# Patient Record
Sex: Female | Born: 1985 | Race: Black or African American | Hispanic: No | Marital: Single | Smoking: Former smoker
Health system: Southern US, Community
[De-identification: ages and names within clinical notes are randomized; demographics above are authoritative.]

## PROBLEM LIST (undated history)

## (undated) DIAGNOSIS — Z8489 Family history of other specified conditions: Secondary | ICD-10-CM

## (undated) DIAGNOSIS — F419 Anxiety disorder, unspecified: Secondary | ICD-10-CM

## (undated) DIAGNOSIS — Z8616 Personal history of COVID-19: Secondary | ICD-10-CM

## (undated) DIAGNOSIS — T8859XA Other complications of anesthesia, initial encounter: Secondary | ICD-10-CM

## (undated) DIAGNOSIS — G40909 Epilepsy, unspecified, not intractable, without status epilepticus: Secondary | ICD-10-CM

## (undated) DIAGNOSIS — F32A Depression, unspecified: Secondary | ICD-10-CM

## (undated) DIAGNOSIS — N83202 Unspecified ovarian cyst, left side: Secondary | ICD-10-CM

## (undated) DIAGNOSIS — K219 Gastro-esophageal reflux disease without esophagitis: Secondary | ICD-10-CM

## (undated) DIAGNOSIS — R519 Headache, unspecified: Secondary | ICD-10-CM

## (undated) DIAGNOSIS — R51 Headache: Secondary | ICD-10-CM

## (undated) DIAGNOSIS — R569 Unspecified convulsions: Secondary | ICD-10-CM

## (undated) HISTORY — DX: Headache, unspecified: R51.9

## (undated) HISTORY — DX: Headache: R51

## (undated) HISTORY — PX: APPENDECTOMY: SHX54

## (undated) HISTORY — PX: OTHER SURGICAL HISTORY: SHX169

---
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
## 2005-12-22 HISTORY — PX: LAPAROSCOPIC OOPHORECTOMY: SUR783

## 2014-12-27 ENCOUNTER — Emergency Department (HOSPITAL_COMMUNITY): Payer: Self-pay

## 2014-12-27 ENCOUNTER — Emergency Department (HOSPITAL_COMMUNITY)
Admission: EM | Admit: 2014-12-27 | Discharge: 2014-12-27 | Disposition: A | Discharge status: Home / Self Care | Payer: Self-pay | Attending: Emergency Medicine | Admitting: Emergency Medicine

## 2014-12-27 ENCOUNTER — Encounter (HOSPITAL_COMMUNITY): Payer: Self-pay | Admitting: *Deleted

## 2014-12-27 DIAGNOSIS — Z720 Tobacco use: Secondary | ICD-10-CM | POA: Insufficient documentation

## 2014-12-27 DIAGNOSIS — R0781 Pleurodynia: Secondary | ICD-10-CM | POA: Insufficient documentation

## 2014-12-27 DIAGNOSIS — R6883 Chills (without fever): Secondary | ICD-10-CM | POA: Insufficient documentation

## 2014-12-27 DIAGNOSIS — J069 Acute upper respiratory infection, unspecified: Secondary | ICD-10-CM | POA: Insufficient documentation

## 2014-12-27 DIAGNOSIS — R000 Tachycardia, unspecified: Secondary | ICD-10-CM | POA: Insufficient documentation

## 2014-12-27 HISTORY — DX: Unspecified convulsions: R56.9

## 2014-12-27 LAB — BASIC METABOLIC PANEL
ANION GAP: 12 (ref 5–15)
BUN: 6 mg/dL (ref 6–23)
CALCIUM: 9.1 mg/dL (ref 8.4–10.5)
CO2: 21 mmol/L (ref 19–32)
CREATININE: 0.67 mg/dL (ref 0.50–1.10)
Chloride: 104 mEq/L (ref 96–112)
GFR calc Af Amer: >90 mL/min (ref >90)
GFR calc non Af Amer: >90 mL/min (ref >90)
Glucose, Bld: 86 mg/dL (ref 70–99)
POTASSIUM: 3.7 mmol/L (ref 3.5–5.1)
Sodium: 137 mmol/L (ref 135–145)

## 2014-12-27 LAB — CBC WITH DIFFERENTIAL/PLATELET
Basophils Absolute: 0 10*3/uL (ref 0.0–0.1)
Basophils Relative: 0 % (ref 0–1)
EOS PCT: 1 % (ref 0–5)
Eosinophils Absolute: 0.1 10*3/uL (ref 0.0–0.7)
HCT: 38.8 % (ref 36.0–46.0)
HEMOGLOBIN: 13.4 g/dL (ref 12.0–15.0)
LYMPHS ABS: 1.6 10*3/uL (ref 0.7–4.0)
LYMPHS PCT: 15 % (ref 12–46)
MCH: 32.4 pg (ref 26.0–34.0)
MCHC: 34.5 g/dL (ref 30.0–36.0)
MCV: 93.7 fL (ref 78.0–100.0)
Monocytes Absolute: 1.5 10*3/uL — ABNORMAL HIGH (ref 0.1–1.0)
Monocytes Relative: 14 % — ABNORMAL HIGH (ref 3–12)
NEUTROS ABS: 7.7 10*3/uL (ref 1.7–7.7)
Neutrophils Relative %: 70 % (ref 43–77)
Platelets: 364 10*3/uL (ref 150–400)
RBC: 4.14 MIL/uL (ref 3.87–5.11)
RDW: 12.9 % (ref 11.5–15.5)
WBC: 10.9 10*3/uL — AB (ref 4.0–10.5)

## 2014-12-27 LAB — TROPONIN I: Troponin I: <0.03 ng/mL (ref <0.031)

## 2014-12-27 LAB — D-DIMER, QUANTITATIVE (NOT AT ARMC)

## 2014-12-27 MED ORDER — SODIUM CHLORIDE 0.9 % IV BOLUS (SEPSIS)
1000.0000 mL | Freq: Once | INTRAVENOUS | Status: AC
  Administered 2014-12-27: 1000 mL via INTRAVENOUS

## 2014-12-27 NOTE — ED Provider Notes (Signed)
CSN: [id number]     Arrival date & time 12/27/14  1246 History   First MD Initiated Contact with Patient 12/27/14 1449     Chief Complaint  Patient presents with  . Cough     (Consider location/radiation/quality/duration/timing/severity/associated sxs/prior Treatment) Patient is a 29 y.o. female presenting with cough.  Cough Cough characteristics:  Productive Sputum characteristics:  Yellow Severity:  Moderate Onset quality:  Gradual Duration:  2 days Timing:  Constant Progression:  Worsening Chronicity:  New Smoker: yes   Context: upper respiratory infection   Relieved by:  Nothing Worsened by:  Nothing tried Ineffective treatments:  None tried Associated symptoms: chest pain (pleuritic), chills, rhinorrhea, shortness of breath and sinus congestion   Associated symptoms: no fever     Past Medical History  Diagnosis Date  . Seizures    History reviewed. No pertinent past surgical history. History reviewed. No pertinent family history. History  Substance Use Topics  . Smoking status: Current Every Day Smoker  . Smokeless tobacco: Not on file  . Alcohol Use: Not on file   OB History    No data available     Review of Systems  Constitutional: Positive for chills. Negative for fever.  HENT: Positive for rhinorrhea.   Respiratory: Positive for cough and shortness of breath.   Cardiovascular: Positive for chest pain (pleuritic).  All other systems reviewed and are negative.     Allergies  Depakote er  Home Medications   Prior to Admission medications   Medication Sig Start Date End Date Taking? Authorizing Provider  guaifenesin (ROBITUSSIN) 100 MG/5ML syrup Take 200 mg by mouth once.   Yes Historical Provider, MD  Pseudoeph-Doxylamine-DM-APAP (NYQUIL PO) Take 1 Dose by mouth once.   Yes Historical Provider, MD   BP 97/51 mmHg  Pulse 84  Temp(Src) 99.6 F (37.6 C) (Oral)  Resp 20  Ht 5\' 7"  (1.702 m)  Wt 170 lb (77.111 kg)  BMI 26.62 kg/m2  SpO2 97%   LMP 12/27/2014 Physical Exam  Constitutional: She is oriented to person, place, and time. She appears well-developed and well-nourished.  HENT:  Head: Normocephalic and atraumatic.  Right Ear: External ear normal.  Left Ear: External ear normal.  Eyes: Conjunctivae and EOM are normal. Pupils are equal, round, and reactive to light.  Neck: Normal range of motion. Neck supple.  Cardiovascular: Regular rhythm, normal heart sounds and intact distal pulses.  Tachycardia present.  Exam reveals no decreased pulses.   Pulmonary/Chest: Effort normal and breath sounds normal.  Abdominal: Soft. Bowel sounds are normal. There is no tenderness.  Musculoskeletal: Normal range of motion.  Neurological: She is alert and oriented to person, place, and time.  Skin: Skin is warm and dry.  Vitals reviewed.   ED Course  Procedures (including critical care time) Labs Review Labs Reviewed  CBC WITH DIFFERENTIAL - Abnormal; Notable for the following:    WBC 10.9 (*)    Monocytes Relative 14 (*)    Monocytes Absolute 1.5 (*)    All other components within normal limits  BASIC METABOLIC PANEL  TROPONIN I  D-DIMER, QUANTITATIVE    Imaging Review Dg Chest 2 View  12/27/2014   CLINICAL DATA:  Cough and chest pain. Shortness of breath since yesterday.  EXAM: CHEST  2 VIEW  COMPARISON:  None.  FINDINGS: Midline trachea. Normal heart size and mediastinal contours. No pleural effusion or pneumothorax. Clear lungs.  IMPRESSION: No acute cardiopulmonary disease.   Electronically Signed   By: Abigail Miyamoto  M.D.   On: 12/27/2014 15:16     EKG Interpretation   Date/Time:  Wednesday December 27 2014 15:34:59 EST Ventricular Rate:  103 PR Interval:  129 QRS Duration: 74 QT Interval:  324 QTC Calculation: 424 R Axis:   85 Text Interpretation:  Sinus tachycardia No old tracing to compare  Confirmed by Debby Freiberg [phone]) on 12/27/2014 4:00:45 PM      MDM   Final diagnoses:  Upper respiratory infection     29 y.o. female without pertinent PMH presents with chest pain, pleurisy, and cough x 2 days.  On arrival vitals and physical exam as above.  Low risk via Wells, cannot PERC 2/2 tachycardia.  Very low risk for ACS.  CXR unremarkable.  Ddimer and wu unremarkable.  Pt has had persistent chest pain over the course of the entire day, so if the pt was having ACS would expect positive troponin by this point.  Likely etiology of symptoms viral upper respiratory infection. Patient discharged home in stable condition with strict return precautions..    I have reviewed all laboratory and imaging studies if ordered as above  1. Upper respiratory infection         Debby Freiberg, MD 12/27/14 [phone]

## 2014-12-27 NOTE — ED Notes (Signed)
Patient transported to X-ray 

## 2014-12-27 NOTE — Discharge Instructions (Signed)
Cough, Adult ° A cough is a reflex that helps clear your throat and airways. It can help heal the body or may be a reaction to an irritated airway. A cough may only last 2 or 3 weeks (acute) or may last more than 8 weeks (chronic).  °CAUSES °Acute cough: °· Viral or bacterial infections. °Chronic cough: °· Infections. °· Allergies. °· Asthma. °· Post-nasal drip. °· Smoking. °· Heartburn or acid reflux. °· Some medicines. °· Chronic lung problems (COPD). °· Cancer. °SYMPTOMS  °· Cough. °· Fever. °· Chest pain. °· Increased breathing rate. °· High-pitched whistling sound when breathing (wheezing). °· Colored mucus that you cough up (sputum). °TREATMENT  °· A bacterial cough may be treated with antibiotic medicine. °· A viral cough must run its course and will not respond to antibiotics. °· Your caregiver may recommend other treatments if you have a chronic cough. °HOME CARE INSTRUCTIONS  °· Only take over-the-counter or prescription medicines for pain, discomfort, or fever as directed by your caregiver. Use cough suppressants only as directed by your caregiver. °· Use a cold steam vaporizer or humidifier in your bedroom or home to help loosen secretions. °· Sleep in a semi-upright position if your cough is worse at night. °· Rest as needed. °· Stop smoking if you smoke. °SEEK IMMEDIATE MEDICAL CARE IF:  °· You have pus in your sputum. °· Your cough starts to worsen. °· You cannot control your cough with suppressants and are losing sleep. °· You begin coughing up blood. °· You have difficulty breathing. °· You develop pain which is getting worse or is uncontrolled with medicine. °· You have a fever. °MAKE SURE YOU:  °· Understand these instructions. °· Will watch your condition. °· Will get help right away if you are not doing well or get worse. °Document Released: 06/06/2011 Document Revised: 03/01/2012 Document Reviewed: 06/06/2011 °ExitCare® Patient Information ©2015 [employer]. This information is not intended  to replace advice given to you by your health care provider. Make sure you discuss any questions you have with your health care provider. °Upper Respiratory Infection, Adult °An upper respiratory infection (URI) is also sometimes known as the common cold. The upper respiratory tract includes the nose, sinuses, throat, trachea, and bronchi. Bronchi are the airways leading to the lungs. Most people improve within 1 week, but symptoms can last up to 2 weeks. A residual cough may last even longer.  °CAUSES °Many different viruses can infect the tissues lining the upper respiratory tract. The tissues become irritated and inflamed and often become very moist. Mucus production is also common. A cold is contagious. You can easily spread the virus to others by oral contact. This includes kissing, sharing a glass, coughing, or sneezing. Touching your mouth or nose and then touching a surface, which is then touched by another person, can also spread the virus. °SYMPTOMS  °Symptoms typically develop 1 to 3 days after you come in contact with a cold virus. Symptoms vary from person to person. They may include: °· Runny nose. °· Sneezing. °· Nasal congestion. °· Sinus irritation. °· Sore throat. °· Loss of voice (laryngitis). °· Cough. °· Fatigue. °· Muscle aches. °· Loss of appetite. °· Headache. °· Low-grade fever. °DIAGNOSIS  °You might diagnose your own cold based on familiar symptoms, since most people get a cold 2 to 3 times a year. Your caregiver can confirm this based on your exam. Most importantly, your caregiver can check that your symptoms are not due to another disease such   as strep throat, sinusitis, pneumonia, asthma, or epiglottitis. Blood tests, throat tests, and X-rays are not necessary to diagnose a common cold, but they may sometimes be helpful in excluding other more serious diseases. Your caregiver will decide if any further tests are required. °RISKS AND COMPLICATIONS  °You may be at risk for a more severe  case of the common cold if you smoke cigarettes, have chronic heart disease (such as heart failure) or lung disease (such as asthma), or if you have a weakened immune system. The very young and very old are also at risk for more serious infections. Bacterial sinusitis, middle ear infections, and bacterial pneumonia can complicate the common cold. The common cold can worsen asthma and chronic obstructive pulmonary disease (COPD). Sometimes, these complications can require emergency medical care and may be life-threatening. °PREVENTION  °The best way to protect against getting a cold is to practice good hygiene. Avoid oral or hand contact with people with cold symptoms. Wash your hands often if contact occurs. There is no clear evidence that vitamin C, vitamin E, echinacea, or exercise reduces the chance of developing a cold. However, it is always recommended to get plenty of rest and practice good nutrition. °TREATMENT  °Treatment is directed at relieving symptoms. There is no cure. Antibiotics are not effective, because the infection is caused by a virus, not by bacteria. Treatment may include: °· Increased fluid intake. Sports drinks offer valuable electrolytes, sugars, and fluids. °· Breathing heated mist or steam (vaporizer or shower). °· Eating chicken soup or other clear broths, and maintaining good nutrition. °· Getting plenty of rest. °· Using gargles or lozenges for comfort. °· Controlling fevers with ibuprofen or acetaminophen as directed by your caregiver. °· Increasing usage of your inhaler if you have asthma. °Zinc gel and zinc lozenges, taken in the first 24 hours of the common cold, can shorten the duration and lessen the severity of symptoms. Pain medicines may help with fever, muscle aches, and throat pain. A variety of non-prescription medicines are available to treat congestion and runny nose. Your caregiver can make recommendations and may suggest nasal or lung inhalers for other symptoms.  °HOME  CARE INSTRUCTIONS  °· Only take over-the-counter or prescription medicines for pain, discomfort, or fever as directed by your caregiver. °· Use a warm mist humidifier or inhale steam from a shower to increase air moisture. This may keep secretions moist and make it easier to breathe. °· Drink enough water and fluids to keep your urine clear or pale yellow. °· Rest as needed. °· Return to work when your temperature has returned to normal or as your caregiver advises. You may need to stay home longer to avoid infecting others. You can also use a face mask and careful hand washing to prevent spread of the virus. °SEEK MEDICAL CARE IF:  °· After the first few days, you feel you are getting worse rather than better. °· You need your caregiver's advice about medicines to control symptoms. °· You develop chills, worsening shortness of breath, or brown or red sputum. These may be signs of pneumonia. °· You develop yellow or brown nasal discharge or pain in the face, especially when you bend forward. These may be signs of sinusitis. °· You develop a fever, swollen neck glands, pain with swallowing, or white areas in the back of your throat. These may be signs of strep throat. °SEEK IMMEDIATE MEDICAL CARE IF:  °· You have a fever. °· You develop severe or persistent headache, ear   pain, sinus pain, or chest pain. °· You develop wheezing, a prolonged cough, cough up blood, or have a change in your usual mucus (if you have chronic lung disease). °· You develop sore muscles or a stiff neck. °Document Released: 06/03/2001 Document Revised: 03/01/2012 Document Reviewed: 03/15/2014 °ExitCare® Patient Information ©2015 [employer]. This information is not intended to replace advice given to you by your health care provider. Make sure you discuss any questions you have with your health care provider. ° °

## 2014-12-27 NOTE — ED Notes (Signed)
Pt in c/o cough and congestion for the last few days, pain when taking a deep breath or coughing, body aches and chills

## 2015-04-07 ENCOUNTER — Emergency Department (HOSPITAL_COMMUNITY)
Admission: EM | Admit: 2015-04-07 | Discharge: 2015-04-07 | Disposition: A | Discharge status: Home / Self Care | Payer: Self-pay | Attending: Emergency Medicine | Admitting: Emergency Medicine

## 2015-04-07 ENCOUNTER — Encounter (HOSPITAL_COMMUNITY): Payer: Self-pay | Admitting: Emergency Medicine

## 2015-04-07 DIAGNOSIS — Z720 Tobacco use: Secondary | ICD-10-CM | POA: Insufficient documentation

## 2015-04-07 DIAGNOSIS — IMO0002 Reserved for concepts with insufficient information to code with codable children: Secondary | ICD-10-CM

## 2015-04-07 DIAGNOSIS — Y9389 Activity, other specified: Secondary | ICD-10-CM | POA: Insufficient documentation

## 2015-04-07 DIAGNOSIS — Y9289 Other specified places as the place of occurrence of the external cause: Secondary | ICD-10-CM | POA: Insufficient documentation

## 2015-04-07 DIAGNOSIS — S61211A Laceration without foreign body of left index finger without damage to nail, initial encounter: Secondary | ICD-10-CM | POA: Insufficient documentation

## 2015-04-07 DIAGNOSIS — Y998 Other external cause status: Secondary | ICD-10-CM | POA: Insufficient documentation

## 2015-04-07 DIAGNOSIS — Z79899 Other long term (current) drug therapy: Secondary | ICD-10-CM | POA: Insufficient documentation

## 2015-04-07 DIAGNOSIS — Y288XXA Contact with other sharp object, undetermined intent, initial encounter: Secondary | ICD-10-CM | POA: Insufficient documentation

## 2015-04-07 NOTE — ED Notes (Signed)
Pt reports cutting L index finger on razor this evening. Bleeding controlled at this time.

## 2015-04-07 NOTE — Discharge Instructions (Signed)
2. Cover with a Band-Aid for the next several days

## 2015-04-07 NOTE — ED Provider Notes (Signed)
CSN: [id number]     Arrival date & time 04/07/15  0011 History   First MD Initiated Contact with Patient 04/07/15 0021     Chief Complaint  Patient presents with  . finger laceration      (Consider location/radiation/quality/duration/timing/severity/associated sxs/prior Treatment) HPI Comments: Patient states she was reaching for something in a cab and inadvertently cut her left index finger on a razor. She has a small superficial avulsion laceration to the medial aspect of the distal left index finger  The history is provided by the patient.    Past Medical History  Diagnosis Date  . Seizures    Past Surgical History  Procedure Laterality Date  . Appendectomy     No family history on file. History  Substance Use Topics  . Smoking status: Current Every Day Smoker -- 0.50 packs/day    Types: Cigarettes  . Smokeless tobacco: Not on file  . Alcohol Use: No   OB History    No data available     Review of Systems  Constitutional: Negative for activity change.  Skin: Positive for wound.  Neurological: Negative for numbness.  All other systems reviewed and are negative.     Allergies  Depakote er  Home Medications   Prior to Admission medications   Medication Sig Start Date End Date Taking? Authorizing Provider  guaifenesin (ROBITUSSIN) 100 MG/5ML syrup Take 200 mg by mouth once.    Historical Provider, MD  Pseudoeph-Doxylamine-DM-APAP (NYQUIL PO) Take 1 Dose by mouth once.    Historical Provider, MD   BP 130/78 mmHg  Pulse 114  Temp(Src) 98.2 F (36.8 C) (Oral)  Resp 20  SpO2 100%  LMP 03/18/2015 Physical Exam  Constitutional: She is oriented to person, place, and time. She appears well-developed and well-nourished.  HENT:  Head: Normocephalic.  Eyes: Pupils are equal, round, and reactive to light.  Neck: Normal range of motion.  Cardiovascular: Normal rate and regular rhythm.   Musculoskeletal: Normal range of motion. She exhibits no tenderness.   Neurological: She is alert and oriented to person, place, and time.  Skin: Skin is warm.  Nursing note and vitals reviewed.   ED Course  LACERATION REPAIR Date/Time: 04/07/2015 1:19 AM Performed by: Junius Creamer Authorized by: Junius Creamer Consent: Verbal consent obtained. Written consent not obtained. Risks and benefits: risks, benefits and alternatives were discussed Consent given by: patient Patient understanding: patient states understanding of the procedure being performed Patient identity confirmed: verbally with patient Body area: upper extremity Location details: left index finger Laceration length: 0.5 cm Foreign bodies: metal Tendon involvement: none Nerve involvement: none Vascular damage: no Anesthetic total: 0 ml Patient sedated: no Preparation: Patient was prepped and draped in the usual sterile fashion. Irrigation solution: saline Skin closure: glue Approximation difficulty: simple Patient tolerance: Patient tolerated the procedure well with no immediate complications   (including critical care time) Labs Review Labs Reviewed - No data to display  Imaging Review No results found.   EKG Interpretation None      MDM   Final diagnoses:  Laceration         Junius Creamer, NP 04/07/15 0121  Debby Freiberg, MD 04/07/15 [phone]

## 2016-02-11 ENCOUNTER — Emergency Department (HOSPITAL_COMMUNITY): Payer: BLUE CROSS/BLUE SHIELD

## 2016-02-11 ENCOUNTER — Emergency Department (HOSPITAL_COMMUNITY)
Admission: EM | Admit: 2016-02-11 | Discharge: 2016-02-11 | Disposition: A | Discharge status: Home / Self Care | Payer: BLUE CROSS/BLUE SHIELD | Attending: Emergency Medicine | Admitting: Emergency Medicine

## 2016-02-11 ENCOUNTER — Encounter (HOSPITAL_COMMUNITY): Payer: Self-pay

## 2016-02-11 DIAGNOSIS — R51 Headache: Secondary | ICD-10-CM | POA: Insufficient documentation

## 2016-02-11 DIAGNOSIS — F17210 Nicotine dependence, cigarettes, uncomplicated: Secondary | ICD-10-CM | POA: Insufficient documentation

## 2016-02-11 DIAGNOSIS — R55 Syncope and collapse: Secondary | ICD-10-CM

## 2016-02-11 DIAGNOSIS — F1210 Cannabis abuse, uncomplicated: Secondary | ICD-10-CM | POA: Diagnosis not present

## 2016-02-11 DIAGNOSIS — Z3202 Encounter for pregnancy test, result negative: Secondary | ICD-10-CM | POA: Insufficient documentation

## 2016-02-11 DIAGNOSIS — R569 Unspecified convulsions: Secondary | ICD-10-CM | POA: Diagnosis present

## 2016-02-11 DIAGNOSIS — F1910 Other psychoactive substance abuse, uncomplicated: Secondary | ICD-10-CM | POA: Insufficient documentation

## 2016-02-11 LAB — CBC WITH DIFFERENTIAL/PLATELET
BASOS ABS: 0 10*3/uL (ref 0.0–0.1)
Basophils Relative: 0 %
Eosinophils Absolute: 0.1 10*3/uL (ref 0.0–0.7)
Eosinophils Relative: 1 %
HEMATOCRIT: 38.3 % (ref 36.0–46.0)
Hemoglobin: 12.3 g/dL (ref 12.0–15.0)
LYMPHS ABS: 2.7 10*3/uL (ref 0.7–4.0)
Lymphocytes Relative: 27 %
MCH: 31.1 pg (ref 26.0–34.0)
MCHC: 32.1 g/dL (ref 30.0–36.0)
MCV: 96.7 fL (ref 78.0–100.0)
MONOS PCT: 5 %
Monocytes Absolute: 0.5 10*3/uL (ref 0.1–1.0)
NEUTROS ABS: 6.6 10*3/uL (ref 1.7–7.7)
NEUTROS PCT: 67 %
Platelets: 370 10*3/uL (ref 150–400)
RBC: 3.96 MIL/uL (ref 3.87–5.11)
RDW: 13.4 % (ref 11.5–15.5)
WBC: 9.9 10*3/uL (ref 4.0–10.5)

## 2016-02-11 LAB — POC URINE PREG, ED: Preg Test, Ur: NEGATIVE

## 2016-02-11 LAB — COMPREHENSIVE METABOLIC PANEL
ALK PHOS: 55 U/L (ref 38–126)
ALT: 10 U/L — AB (ref 14–54)
AST: 12 U/L — AB (ref 15–41)
Albumin: 3.9 g/dL (ref 3.5–5.0)
Anion gap: 10 (ref 5–15)
BILIRUBIN TOTAL: 0.2 mg/dL — AB (ref 0.3–1.2)
BUN: 13 mg/dL (ref 6–20)
CALCIUM: 8.8 mg/dL — AB (ref 8.9–10.3)
CO2: 21 mmol/L — ABNORMAL LOW (ref 22–32)
CREATININE: 0.7 mg/dL (ref 0.44–1.00)
Chloride: 108 mmol/L (ref 101–111)
GFR calc Af Amer: >60 mL/min (ref >60)
Glucose, Bld: 100 mg/dL — ABNORMAL HIGH (ref 65–99)
Potassium: 3.8 mmol/L (ref 3.5–5.1)
Sodium: 139 mmol/L (ref 135–145)
TOTAL PROTEIN: 6.4 g/dL — AB (ref 6.5–8.1)

## 2016-02-11 LAB — RAPID URINE DRUG SCREEN, HOSP PERFORMED
Amphetamines: NOT DETECTED
Barbiturates: NOT DETECTED
Benzodiazepines: POSITIVE — AB
Cocaine: NOT DETECTED
OPIATES: NOT DETECTED
Tetrahydrocannabinol: POSITIVE — AB

## 2016-02-11 NOTE — ED Notes (Signed)
Upon speaking with patient, she revealed that she has been having seizures since she was 30 y/o and her last seizure was 4 years ago. Pt confirmed that she is not on any seizure medications. 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

## 2016-02-11 NOTE — ED Notes (Addendum)
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 When FD and EMS arrived she was awake but post-ictal such as confusion with no memory of what happened. She has a hx of seizures but last one was at 30 years old and she is not on any seizure medications. Pt is now A&Ox4 but reports HA and sleepiness as well as pain to inside of right cheek, no blood noted. No seizure activity witnessed by anyone. CBG-106 BP-109/75, HR-86, 99% RA.

## 2016-02-11 NOTE — ED Notes (Signed)
Pt states 'I dont feel like I had a seizure."

## 2016-02-11 NOTE — ED Notes (Signed)
Dr Pickering at bedside 

## 2016-02-11 NOTE — Discharge Instructions (Signed)

## 2016-02-11 NOTE — ED Provider Notes (Signed)
CSN: [id number]     Arrival date & time 02/11/16  1540 History   First MD Initiated Contact with Patient 02/11/16 1554     Chief Complaint  Patient presents with  . Seizures    Patient is a 30 y.o. female presenting with seizures. The history is provided by the patient.  Seizures Seizure activity on arrival: no    patient presents after syncopal episode. Found confused and less responsive on the floor. History of seizures but last one was around 4 years ago. States she's had a headache for the last week. It is dull and on the right side. Is constant. No localizing numbness or weakness. Reportedly was found on the floor and was confused. Mental status but improving. Slight pain to her right cheek area. Patient states she does not feels if she has had a seizure. No chest pain. She been doing well last few days otherwise. Denies dysuria. Denies chest pain. Denies cough.  Past Medical History  Diagnosis Date  . Seizures [facility])    Past Surgical History  Procedure Laterality Date  . Appendectomy     No family history on file. Social History  Substance Use Topics  . Smoking status: Current Every Day Smoker -- 0.50 packs/day    Types: Cigarettes  . Smokeless tobacco: None  . Alcohol Use: No   OB History    No data available     Review of Systems  Constitutional: Negative for activity change and appetite change.  Eyes: Negative for pain.  Respiratory: Negative for chest tightness and shortness of breath.   Cardiovascular: Negative for chest pain and leg swelling.  Gastrointestinal: Negative for nausea, vomiting, abdominal pain and diarrhea.  Genitourinary: Negative for flank pain.  Musculoskeletal: Negative for back pain and neck stiffness.  Skin: Negative for rash.  Neurological: Positive for seizures and syncope. Negative for weakness, numbness and headaches.  Psychiatric/Behavioral: Negative for behavioral problems.      Allergies  Depakote er  Home Medications   Prior to  Admission medications   Medication Sig Start Date End Date Taking? Authorizing Provider  Vitamin D, Ergocalciferol, (DRISDOL) 50000 units CAPS capsule Take 50,000 Units by mouth 2 (two) times a week. mon and thurs   Yes Historical Provider, MD   BP 102/72 mmHg  Pulse 78  Temp(Src) 98.6 F (37 C) (Oral)  Resp 11  Ht 5\' 7"  (1.702 m)  Wt 168 lb (76.204 kg)  BMI 26.31 kg/m2  SpO2 100% Physical Exam  Constitutional: She is oriented to person, place, and time. She appears well-developed and well-nourished.  HENT:  Head: Normocephalic and atraumatic.  No visible trauma in her mouth.  Eyes: EOM are normal. Pupils are equal, round, and reactive to light.  Neck: Normal range of motion. Neck supple.  Cardiovascular: Normal rate, regular rhythm and normal heart sounds.   No murmur heard. Pulmonary/Chest: Effort normal and breath sounds normal. No respiratory distress. She has no wheezes. She has no rales.  Abdominal: Soft. Bowel sounds are normal. She exhibits no distension. There is no tenderness.  Musculoskeletal: Normal range of motion.  Neurological: She is alert and oriented to person, place, and time. No cranial nerve deficit.  Skin: Skin is warm and dry.  Psychiatric: She has a normal mood and affect. Her speech is normal.  Nursing note and vitals reviewed.   ED Course  Procedures (including critical care time) Labs Review Labs Reviewed  COMPREHENSIVE METABOLIC PANEL - Abnormal; Notable for the following:    CO2  21 (*)    Glucose, Bld 100 (*)    Calcium 8.8 (*)    Total Protein 6.4 (*)    AST 12 (*)    ALT 10 (*)    Total Bilirubin 0.2 (*)    All other components within normal limits  URINE RAPID DRUG SCREEN, HOSP PERFORMED - Abnormal; Notable for the following:    Benzodiazepines POSITIVE (*)    Tetrahydrocannabinol POSITIVE (*)    All other components within normal limits  CBC WITH DIFFERENTIAL/PLATELET  POC URINE PREG, ED    Imaging Review Ct Head Wo  Contrast  02/11/2016  CLINICAL DATA:  Headache for last 2 weeks. Dizziness with standing. History of seizures. EXAM: CT HEAD WITHOUT CONTRAST TECHNIQUE: Contiguous axial images were obtained from the base of the skull through the vertex without intravenous contrast. COMPARISON:  None. FINDINGS: No acute intracranial abnormality. Specifically, no hemorrhage, hydrocephalus, mass lesion, acute infarction, or significant intracranial injury. No acute calvarial abnormality. Visualized paranasal sinuses and mastoids clear. Orbital soft tissues unremarkable. IMPRESSION: Negative. Electronically Signed   By: Rolm Baptise M.D.   On: 02/11/2016 16:47   I have personally reviewed and evaluated these images and lab results as part of my medical decision-making.   EKG Interpretation   Date/Time:  Monday February 11 2016 16:53:17 EST Ventricular Rate:  75 PR Interval:  118 QRS Duration: 81 QT Interval:  377 QTC Calculation: 421 R Axis:   94 Text Interpretation:  Sinus rhythm Borderline short PR interval Borderline  right axis deviation Confirmed by Alvino Chapel  MD, Ovid Curd [phone]) on  02/11/2016 9:03:54 PM      MDM   Final diagnoses:  Syncope, unspecified syncope type    Patient with syncope. Initial concern for seizure but no witnessed seizure activity on camera that was recording the daycare center. Reportedly had been grabbing her shirt like she was hot. Had some confusion after. Head CT reassuring. Lab work reassuring. Urine drug screen did show benzos and marijuana. Patient denies drug use. Denies any benzos. I did not see that EMS gave her any medicine. This point I'm not sure this is a seizure. There is no trauma to her tongue she was not incontinent although it is a possibility. This point I will not put her on seizure precautions. Will discharge home to follow-up with her doctors as needed.    Davonna Belling, MD 02/11/16 2105

## 2016-02-29 ENCOUNTER — Emergency Department (HOSPITAL_COMMUNITY)
Admission: EM | Admit: 2016-02-29 | Discharge: 2016-02-29 | Disposition: A | Discharge status: Home / Self Care | Payer: BLUE CROSS/BLUE SHIELD | Attending: Emergency Medicine | Admitting: Emergency Medicine

## 2016-02-29 ENCOUNTER — Encounter (HOSPITAL_COMMUNITY): Payer: Self-pay | Admitting: Emergency Medicine

## 2016-02-29 DIAGNOSIS — R42 Dizziness and giddiness: Secondary | ICD-10-CM

## 2016-02-29 DIAGNOSIS — Z3202 Encounter for pregnancy test, result negative: Secondary | ICD-10-CM | POA: Insufficient documentation

## 2016-02-29 DIAGNOSIS — R519 Headache, unspecified: Secondary | ICD-10-CM

## 2016-02-29 DIAGNOSIS — F17210 Nicotine dependence, cigarettes, uncomplicated: Secondary | ICD-10-CM | POA: Insufficient documentation

## 2016-02-29 DIAGNOSIS — R110 Nausea: Secondary | ICD-10-CM | POA: Insufficient documentation

## 2016-02-29 DIAGNOSIS — R51 Headache: Secondary | ICD-10-CM | POA: Insufficient documentation

## 2016-02-29 LAB — BASIC METABOLIC PANEL
Anion gap: 8 (ref 5–15)
BUN: 7 mg/dL (ref 6–20)
CHLORIDE: 107 mmol/L (ref 101–111)
CO2: 25 mmol/L (ref 22–32)
CREATININE: 0.67 mg/dL (ref 0.44–1.00)
Calcium: 9.2 mg/dL (ref 8.9–10.3)
GFR calc non Af Amer: >60 mL/min (ref >60)
Glucose, Bld: 89 mg/dL (ref 65–99)
Potassium: 4.1 mmol/L (ref 3.5–5.1)
Sodium: 140 mmol/L (ref 135–145)

## 2016-02-29 LAB — URINALYSIS, ROUTINE W REFLEX MICROSCOPIC
Bilirubin Urine: NEGATIVE
GLUCOSE, UA: NEGATIVE mg/dL
HGB URINE DIPSTICK: NEGATIVE
Ketones, ur: NEGATIVE mg/dL
Leukocytes, UA: NEGATIVE
Nitrite: NEGATIVE
PH: 8 (ref 5.0–8.0)
Protein, ur: NEGATIVE mg/dL
SPECIFIC GRAVITY, URINE: 1.022 (ref 1.005–1.030)

## 2016-02-29 LAB — CBC
HCT: 40.6 % (ref 36.0–46.0)
HEMOGLOBIN: 13.5 g/dL (ref 12.0–15.0)
MCH: 32.5 pg (ref 26.0–34.0)
MCHC: 33.3 g/dL (ref 30.0–36.0)
MCV: 97.8 fL (ref 78.0–100.0)
PLATELETS: 385 10*3/uL (ref 150–400)
RBC: 4.15 MIL/uL (ref 3.87–5.11)
RDW: 13.5 % (ref 11.5–15.5)
WBC: 10.8 10*3/uL — ABNORMAL HIGH (ref 4.0–10.5)

## 2016-02-29 LAB — I-STAT BETA HCG BLOOD, ED (MC, WL, AP ONLY): I-stat hCG, quantitative: <5 m[IU]/mL (ref <5)

## 2016-02-29 LAB — CBG MONITORING, ED: Glucose-Capillary: 90 mg/dL (ref 65–99)

## 2016-02-29 MED ORDER — MECLIZINE HCL 25 MG PO TABS
25.0000 mg | ORAL_TABLET | Freq: Once | ORAL | Status: AC
  Administered 2016-02-29: 25 mg via ORAL
  Filled 2016-02-29: qty 1

## 2016-02-29 MED ORDER — SUMATRIPTAN SUCCINATE 6 MG/0.5ML ~~LOC~~ SOLN
6.0000 mg | Freq: Once | SUBCUTANEOUS | Status: AC
  Administered 2016-02-29: 6 mg via SUBCUTANEOUS
  Filled 2016-02-29: qty 0.5

## 2016-02-29 NOTE — Discharge Instructions (Signed)
It was our pleasure to provide your ER care today - we hope that you feel better.  Rest. Drink plenty of flids.  Try taking tylenol, advil, or excedrin as need for pain.  Follow up with primary care doctor in the coming week.  Also follow up with neurologist in the next 1-2 weeks.  Return to ER if worse, new symptoms, fevers, severe headache, persistent vomiting, weak/fainting, other concern.  You were given medication in the ER that causes drowsiness - no driving for the next 4 hours.    General Headache Without Cause A headache is pain or discomfort felt around the head or neck area. The specific cause of a headache may not be found. There are many causes and types of headaches. A few common ones are:  Tension headaches.  Migraine headaches.  Cluster headaches.  Chronic daily headaches. HOME CARE INSTRUCTIONS  Watch your condition for any changes. Take these steps to help with your condition: Managing Pain  Take over-the-counter and prescription medicines only as told by your health care provider.  Lie down in a dark, quiet room when you have a headache.  If directed, apply ice to the head and neck area:  Put ice in a plastic bag.  Place a towel between your skin and the bag.  Leave the ice on for 20 minutes, 2-3 times per day.  Use a heating pad or hot shower to apply heat to the head and neck area as told by your health care provider.  Keep lights dim if bright lights bother you or make your headaches worse. Eating and Drinking  Eat meals on a regular schedule.  Limit alcohol use.  Decrease the amount of caffeine you drink, or stop drinking caffeine. General Instructions  Keep all follow-up visits as told by your health care provider. This is important.  Keep a headache journal to help find out what may trigger your headaches. For example, write down:  What you eat and drink.  How much sleep you get.  Any change to your diet or medicines.  Try  massage or other relaxation techniques.  Limit stress.  Sit up straight, and do not tense your muscles.  Do not use tobacco products, including cigarettes, chewing tobacco, or e-cigarettes. If you need help quitting, ask your health care provider.  Exercise regularly as told by your health care provider.  Sleep on a regular schedule. Get 7-9 hours of sleep, or the amount recommended by your health care provider. SEEK MEDICAL CARE IF:   Your symptoms are not helped by medicine.  You have a headache that is different from the usual headache.  You have nausea or you vomit.  You have a fever. SEEK IMMEDIATE MEDICAL CARE IF:   Your headache becomes severe.  You have repeated vomiting.  You have a stiff neck.  You have a loss of vision.  You have problems with speech.  You have pain in the eye or ear.  You have muscular weakness or loss of muscle control.  You lose your balance or have trouble walking.  You feel faint or pass out.  You have confusion.   This information is not intended to replace advice given to you by your health care provider. Make sure you discuss any questions you have with your health care provider.   Document Released: 12/08/2005 Document Revised: 08/29/2015 Document Reviewed: 04/02/2015 Elsevier Interactive Patient Education 2016 Elsevier Inc[employer]  Dizziness Dizziness is a common problem. It is a feeling of unsteadiness or  light-headedness. You may feel like you are about to faint. Dizziness can lead to injury if you stumble or fall. Anyone can become dizzy, but dizziness is more common in older adults. This condition can be caused by a number of things, including medicines, dehydration, or illness. HOME CARE INSTRUCTIONS Taking these steps may help with your condition: Eating and Drinking  Drink enough fluid to keep your urine clear or pale yellow. This helps to keep you from becoming dehydrated. Try to drink more clear fluids, such as water.  Do  not drink alcohol.  Limit your caffeine intake if directed by your health care provider.  Limit your salt intake if directed by your health care provider. Activity  Avoid making quick movements.  Rise slowly from chairs and steady yourself until you feel okay.  In the morning, first sit up on the side of the bed. When you feel okay, stand slowly while you hold onto something until you know that your balance is fine.  Move your legs often if you need to stand in one place for a long time. Tighten and relax your muscles in your legs while you are standing.  Do not drive or operate heavy machinery if you feel dizzy.  Avoid bending down if you feel dizzy. Place items in your home so that they are easy for you to reach without leaning over. Lifestyle  Do not use any tobacco products, including cigarettes, chewing tobacco, or electronic cigarettes. If you need help quitting, ask your health care provider.  Try to reduce your stress level, such as with yoga or meditation. Talk with your health care provider if you need help. General Instructions  Watch your dizziness for any changes.  Take medicines only as directed by your health care provider. Talk with your health care provider if you think that your dizziness is caused by a medicine that you are taking.  Tell a friend or a family member that you are feeling dizzy. If he or she notices any changes in your behavior, have this person call your health care provider.  Keep all follow-up visits as directed by your health care provider. This is important. SEEK MEDICAL CARE IF:  Your dizziness does not go away.  Your dizziness or light-headedness gets worse.  You feel nauseous.  You have reduced hearing.  You have new symptoms.  You are unsteady on your feet or you feel like the room is spinning. SEEK IMMEDIATE MEDICAL CARE IF:  You vomit or have diarrhea and are unable to eat or drink anything.  You have problems talking,  walking, swallowing, or using your arms, hands, or legs.  You feel generally weak.  You are not thinking clearly or you have trouble forming sentences. It may take a friend or family member to notice this.  You have chest pain, abdominal pain, shortness of breath, or sweating.  Your vision changes.  You notice any bleeding.  You have a headache.  You have neck pain or a stiff neck.  You have a fever.   This information is not intended to replace advice given to you by your health care provider. Make sure you discuss any questions you have with your health care provider.   Document Released: 06/03/2001 Document Revised: 04/24/2015 Document Reviewed: 12/04/2014 Elsevier Interactive Patient Education [employer].

## 2016-02-29 NOTE — ED Provider Notes (Signed)
CSN: [id number]     Arrival date & time 02/29/16  1002 History   First MD Initiated Contact with Patient 02/29/16 1042     Chief Complaint  Patient presents with  . Headache  . Dizziness     (Consider location/radiation/quality/duration/timing/severity/associated sxs/prior Treatment) Patient is a 30 y.o. female presenting with headaches and dizziness. The history is provided by the patient.  Headache Associated symptoms: dizziness and nausea   Associated symptoms: no abdominal pain, no back pain, no cough, no diarrhea, no eye pain, no fever, no neck pain, no neck stiffness, no numbness, no sinus pressure, no sore throat and no weakness   Dizziness Associated symptoms: headaches and nausea   Associated symptoms: no chest pain, no diarrhea, no shortness of breath and no weakness   Patient c/o intermittent headache in the past couple weeks. States several x per week. Headaches come and go. Frontal/bilateral in location. No specific exacerbating or alleviating factors. Hx similar headaches in past, no hx migraines. No recent head trauma. No acute or abrupt worsening of head pain today. Denies eye pain or change in vision. No neck pain or stiffness. No cough or uri c/o. No sinus pain. No fever or chills. Had episodes nv earlier today, not bloody or bilious. No current nausea. Also notes intermittent dizziness, describes room spinning sensation and light headed feeling w standing. No ear pain, tinnitus or hearing loss, but does note fluids/rushing sensation when lies on right ear/side occasionally.  Pt denies numbness/weakness. No change in speech. No change in normal functional ability, coordination or gait.       Past Medical History  Diagnosis Date  . Seizures [facility])    Past Surgical History  Procedure Laterality Date  . Appendectomy     No family history on file. Social History  Substance Use Topics  . Smoking status: Current Every Day Smoker -- 0.50 packs/day    Types: Cigarettes   . Smokeless tobacco: None  . Alcohol Use: No   OB History    No data available     Review of Systems  Constitutional: Negative for fever and chills.  HENT: Negative for sinus pressure and sore throat.   Eyes: Negative for pain, redness and visual disturbance.  Respiratory: Negative for cough and shortness of breath.   Cardiovascular: Negative for chest pain.  Gastrointestinal: Positive for nausea. Negative for abdominal pain and diarrhea.  Genitourinary: Negative for dysuria and flank pain.  Musculoskeletal: Negative for back pain, neck pain and neck stiffness.  Skin: Negative for rash.  Neurological: Positive for dizziness and headaches. Negative for weakness and numbness.  Hematological: Does not bruise/bleed easily.  Psychiatric/Behavioral: Negative for confusion.      Allergies  Depakote er  Home Medications   Prior to Admission medications   Medication Sig Start Date End Date Taking? Authorizing Provider  Vitamin D, Ergocalciferol, (DRISDOL) 50000 units CAPS capsule Take 50,000 Units by mouth 2 (two) times a week. mon and thurs    Historical Provider, MD   BP 100/68 mmHg  Pulse 86  Temp(Src) 98.6 F (37 C) (Oral)  Resp 16  Ht 5\' 7"  (1.702 m)  Wt 76.715 kg  BMI 26.48 kg/m2  SpO2 99%  LMP 02/05/2016 Physical Exam  Constitutional: She is oriented to person, place, and time. She appears well-developed and well-nourished. No distress.  HENT:  Head: Atraumatic.  Nose: Nose normal.  Mouth/Throat: Oropharynx is clear and moist.  No sinus or temporal tenderness. Clear fluid behind right tm. No acute OM.  No mastoid tenderness.   Eyes: Conjunctivae and EOM are normal. Pupils are equal, round, and reactive to light. No scleral icterus.  Neck: Normal range of motion. Neck supple. No tracheal deviation present. No thyromegaly present.  No stiffness or rigidity.   Cardiovascular: Normal rate, regular rhythm, normal heart sounds and intact distal pulses.  Exam reveals no  gallop and no friction rub.   No murmur heard. Pulmonary/Chest: Effort normal and breath sounds normal. No respiratory distress.  Abdominal: Soft. Normal appearance and bowel sounds are normal. She exhibits no distension. There is no tenderness.  Genitourinary:  No cva tenderness.  Musculoskeletal: Normal range of motion. She exhibits no edema or tenderness.  Neurological: She is alert and oriented to person, place, and time. No cranial nerve deficit.  No nystagmus. Motor intact bilaterally. stre 5/5. sens grossly intact.  Steady gait.   Skin: Skin is warm and dry. No rash noted. She is not diaphoretic.  Psychiatric: She has a normal mood and affect.  Nursing note and vitals reviewed.   ED Course  Procedures (including critical care time) Labs Review  Results for orders placed or performed during the hospital encounter of [id number]  Basic metabolic panel  Result Value Ref Range   Sodium 140 135 - 145 mmol/L   Potassium 4.1 3.5 - 5.1 mmol/L   Chloride 107 101 - 111 mmol/L   CO2 25 22 - 32 mmol/L   Glucose, Bld 89 65 - 99 mg/dL   BUN 7 6 - 20 mg/dL   Creatinine, Ser 0.67 0.44 - 1.00 mg/dL   Calcium 9.2 8.9 - 10.3 mg/dL   GFR calc non Af Amer >60 >60 mL/min   GFR calc Af Amer >60 >60 mL/min   Anion gap 8 5 - 15  CBC  Result Value Ref Range   WBC 10.8 (H) 4.0 - 10.5 K/uL   RBC 4.15 3.87 - 5.11 MIL/uL   Hemoglobin 13.5 12.0 - 15.0 g/dL   HCT 40.6 36.0 - 46.0 %   MCV 97.8 78.0 - 100.0 fL   MCH 32.5 26.0 - 34.0 pg   MCHC 33.3 30.0 - 36.0 g/dL   RDW 13.5 11.5 - 15.5 %   Platelets 385 150 - 400 K/uL  Urinalysis, Routine w reflex microscopic (not at [facility])  Result Value Ref Range   Color, Urine YELLOW YELLOW   APPearance CLOUDY (A) CLEAR   Specific Gravity, Urine 1.022 1.005 - 1.030   pH 8.0 5.0 - 8.0   Glucose, UA NEGATIVE NEGATIVE mg/dL   Hgb urine dipstick NEGATIVE NEGATIVE   Bilirubin Urine NEGATIVE NEGATIVE   Ketones, ur NEGATIVE NEGATIVE mg/dL   Protein, ur NEGATIVE  NEGATIVE mg/dL   Nitrite NEGATIVE NEGATIVE   Leukocytes, UA NEGATIVE NEGATIVE  CBG monitoring, ED  Result Value Ref Range   Glucose-Capillary 90 65 - 99 mg/dL  I-Stat beta hCG blood, ED ([facility], [facility], [facility] only)  Result Value Ref Range   I-stat hCG, quantitative <5.0 <5 mIU/mL   Comment 3           Ct Head Wo Contrast  02/11/2016  CLINICAL DATA:  Headache for last 2 weeks. Dizziness with standing. History of seizures. EXAM: CT HEAD WITHOUT CONTRAST TECHNIQUE: Contiguous axial images were obtained from the base of the skull through the vertex without intravenous contrast. COMPARISON:  None. FINDINGS: No acute intracranial abnormality. Specifically, no hemorrhage, hydrocephalus, mass lesion, acute infarction, or significant intracranial injury. No acute calvarial abnormality. Visualized paranasal sinuses and mastoids clear.  Orbital soft tissues unremarkable. IMPRESSION: Negative. Electronically Signed   By: Rolm Baptise M.D.   On: 02/11/2016 16:47       I have personally reviewed and evaluated these lab results as part of my medical decision-making.   EKG Interpretation   Date/Time:  Friday February 29 2016 12:06:38 EST Ventricular Rate:  68 PR Interval:  123 QRS Duration: 76 QT Interval:  384 QTC Calculation: 408 R Axis:   87 Text Interpretation:  Sinus rhythm No significant change since last  tracing Confirmed by Ashok Cordia  MD, Lennette Bihari ([id number]) on 02/29/2016 12:10:40 PM      MDM    Reviewed nursing notes and prior charts for additional history.   Po fluids. Motrin po.  Patient indicates has been seen in ED for same, and has seen pcp for same - states pcp has talked about referring to neurologist for headaches.   Recent head ct for same symptoms neg for acute process.  imitrex sq for headache, ? Migraine.  Will try antivert for symptom relief.   Recheck, no faintness or dizziness. Ambulates w steady gait.   Pt indicates feels improved and ready for d/c.      Lajean Saver,  MD 02/29/16 1309

## 2016-02-29 NOTE — ED Notes (Signed)
Pt reports headache and dizziness x 2 weeks. Pt reports 2 days ago began with headache. This morning woke up with dizziness and vomiting. Pt seen here for similar symptoms 2 weeks ago.

## 2016-03-24 ENCOUNTER — Ambulatory Visit (INDEPENDENT_AMBULATORY_CARE_PROVIDER_SITE_OTHER): Payer: BLUE CROSS/BLUE SHIELD | Admitting: Neurology

## 2016-03-24 ENCOUNTER — Encounter: Payer: Self-pay | Admitting: Neurology

## 2016-03-24 VITALS — BP 113/74 | HR 90 | Ht 67.0 in | Wt 169.0 lb

## 2016-03-24 DIAGNOSIS — R569 Unspecified convulsions: Secondary | ICD-10-CM

## 2016-03-24 DIAGNOSIS — G43109 Migraine with aura, not intractable, without status migrainosus: Secondary | ICD-10-CM | POA: Diagnosis not present

## 2016-03-24 DIAGNOSIS — G43909 Migraine, unspecified, not intractable, without status migrainosus: Secondary | ICD-10-CM | POA: Insufficient documentation

## 2016-03-24 DIAGNOSIS — G40909 Epilepsy, unspecified, not intractable, without status epilepticus: Secondary | ICD-10-CM | POA: Insufficient documentation

## 2016-03-24 MED ORDER — SUMATRIPTAN SUCCINATE 50 MG PO TABS: 100.0000 mg | ORAL_TABLET | Freq: Once | ORAL | Status: DC | PRN

## 2016-03-24 MED ORDER — SUMATRIPTAN SUCCINATE 50 MG PO TABS: 50.0000 mg | ORAL_TABLET | Freq: Once | ORAL | Status: DC | PRN

## 2016-03-24 MED ORDER — TOPIRAMATE 100 MG PO TABS: 100.0000 mg | ORAL_TABLET | Freq: Two times a day (BID) | ORAL | Status: DC

## 2016-03-24 NOTE — Progress Notes (Signed)
PATIENT: Virginia Bradley DOB: February 20, 1986  Chief Complaint  Patient presents with  . Seizures    Reports having seizures as an infant due to high fever.  She did not have any further trouble until high school when she started having seizures again.  She was on mulitple medications in school - Trileptal, Tegretol, Dilantin, and Depakote.  She stopped her medications because of financial constraints and did well.  She did not have another seizure until 2-3 years ago.  She was then placed on Keppra but has since stopped taking it too.  She has not had any other events.  . Headache    She has been having daily headaches that vary in severity.  She typically takes Excedrin Migraine.  One of her worst headaches was 02/29/16 and she actully passed out at work.  She has a constant whooshing noise in her right ear.  She sometimes has nausea and light sensitivity with her headaches.     HISTORICAL  Virginia Bradley is a 30 years old right-handed female, seen in refer by her primary care PA Minette Brine for evaluation of seizure, and frequent headaches.  She had a history of seizure since infant, initially it was associated with high fever, she began to have recurrent seizure again in 2001, while she was at high school, generalized tonic-clonic seizure, occasionally preceded by mouth headache pacing her mouth, lasting for a few minutes, before the onset of seizure, always have a bad headache after seizure, she was treated with different medications in the past, Dilantin-she continue have seizure, she was switched to Tegretol/Trileptal from 2003 to 2005, eventually she was switched to Keppra 500 mg twice a day, she has stopped taking Keppra since 2011 due to financial reasons, she also complains of excessive sleepiness with Keppra, per patient she [place] out [place], but she never followed up on the result,  last seizure she could remember was in 2015. Before that was 2013. Occasionally she  has spells of metallic taste, but no passing out   She had long-standing history of headache, holocranial pulsating headache, occasionally lateralized severe pounding headache with associated light noise sensitivity, nauseous, her headache usually lasts for 1 day, Excedrin Migraine usually works well for her headaches, she has tried over-the-counter Tylenol, Aleve, ibuprofen, BC powder, which is not effective, since January 2017, she began have daily headaches, repair multiple dose of Effexor to migraine  She went to emergency room February 11 2016, found confused on the floor, suspicious for post ictal, per ED record, 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  REVIEW OF SYSTEMS: Full 14 system review of systems performed and notable only for weight loss, fatigue, palpitation, ringing year, spinning sensation, blurry vision, double vision, joint pain, memory loss, headaches, dizziness, passing out, insomnia, sleepiness, not enough sleep, decreased energy, change in appetite  ALLERGIES: Allergies  Allergen Reactions  . Depakote Er [Divalproex Sodium Er] Hives, Swelling and Palpitations    angioedema     HOME MEDICATIONS: Current Outpatient Prescriptions  Medication Sig Dispense Refill  . aspirin-acetaminophen-caffeine (EXCEDRIN MIGRAINE) 250-250-65 MG tablet Take 1 tablet by mouth every 6 (six) hours as needed for  headache.     No current facility-administered medications for this visit.    PAST MEDICAL HISTORY: Past Medical History  Diagnosis Date  . Seizures ([place])   . Headache     PAST SURGICAL HISTORY: Past Surgical History  Procedure  Laterality Date  . Appendectomy    . Ovary removed      FAMILY HISTORY: Family History  Problem Relation Age of Onset  . Seizures Mother   . Hypertension Maternal Grandmother     SOCIAL HISTORY:  Social History   Social History  . Marital Status: Single    Spouse Name: N/A  . Number of Children: 0  . Years of Education: Some clg   Occupational History  . Daycare Employee    Social History Main Topics  . Smoking status: Current Some Day Smoker -- 0.50 packs/day    Types: Cigarettes  . Smokeless tobacco: Not on file  . Alcohol Use: No  . Drug Use: Yes     Comment: Smokes marijuana three times monthly.  . Sexual Activity: Not on file   Other Topics Concern  . Not on file   Social History Narrative   Lives at home with cousin.   Right-handed.   No caffeine use.     PHYSICAL EXAM   Filed Vitals:   03/24/16 1515  BP: 113/74  Pulse: 90  Height: 5\' 7"  (1.702 m)  Weight: 169 lb (76.658 kg)    Not recorded      Body mass index is 26.46 kg/(m^2).  PHYSICAL EXAMNIATION:  Gen: NAD, conversant, well nourised, obese, well groomed                     Cardiovascular: Regular rate rhythm, no peripheral edema, warm, nontender. Eyes: Conjunctivae clear without exudates or hemorrhage Neck: Supple, no carotid bruise. Pulmonary: Clear to auscultation bilaterally   NEUROLOGICAL EXAM:  MENTAL STATUS: Speech:    Speech is normal; fluent and spontaneous with normal comprehension.  Cognition:     Orientation to time, place and person     Normal recent and remote memory     Normal Attention span and concentration     Normal Language, naming, repeating,spontaneous speech     Fund of knowledge   CRANIAL NERVES: CN II: Visual fields are full to confrontation. Fundoscopic exam showed blurry at at bilateral funduscopy examination. Pupils are round equal and briskly reactive to light. CN III, IV, VI: extraocular movement are normal. No ptosis. CN V: Facial sensation is  intact to pinprick in all 3 divisions bilaterally. Corneal responses are intact.  CN VII: Face is symmetric with normal eye closure and smile. CN VIII: Hearing is normal to rubbing fingers CN IX, X: Palate elevates symmetrically. Phonation is normal. CN XI: Head turning and shoulder shrug are intact CN XII: Tongue is midline with normal movements and no atrophy.  MOTOR: There is no pronator drift of out-stretched arms. Muscle bulk and tone are normal. Muscle strength is normal.  REFLEXES: Reflexes are 2+ and symmetric at the biceps, triceps, knees, and ankles. Plantar responses are flexor.  SENSORY: Intact to light touch, pinprick, positional sensation and vibratory sensation are intact in fingers and toes.  COORDINATION: Rapid alternating movements and fine finger movements are intact. There is no dysmetria on finger-to-nose and heel-knee-shin.    GAIT/STANCE: Posture is normal. Gait is steady with normal steps, base, arm swing, and turning. Heel and toe walking are normal. Tandem gait is normal.  Romberg is absent.  DIAGNOSTIC DATA (LABS, IMAGING, TESTING) - I reviewed patient records, labs, notes, testing and imaging myself where available.   ASSESSMENT AND PLAN  Virginia Bradley is a 30 y.o. 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 Ph.D.  [facility] [address] [place], [place] [address] Ph: [phone] Fax: [phone]  CC: Minette Brine

## 2016-04-02 ENCOUNTER — Ambulatory Visit (INDEPENDENT_AMBULATORY_CARE_PROVIDER_SITE_OTHER): Payer: BLUE CROSS/BLUE SHIELD

## 2016-04-02 ENCOUNTER — Encounter: Payer: Self-pay | Admitting: Neurology

## 2016-04-02 DIAGNOSIS — R569 Unspecified convulsions: Secondary | ICD-10-CM | POA: Diagnosis not present

## 2016-04-02 DIAGNOSIS — G43109 Migraine with aura, not intractable, without status migrainosus: Secondary | ICD-10-CM

## 2016-04-02 MED ORDER — GADOPENTETATE DIMEGLUMINE 469.01 MG/ML IV SOLN: 15.0000 mL | Freq: Once | INTRAVENOUS | Status: DC | PRN

## 2016-04-14 ENCOUNTER — Encounter: Payer: Self-pay | Admitting: Neurology

## 2016-04-14 ENCOUNTER — Ambulatory Visit (INDEPENDENT_AMBULATORY_CARE_PROVIDER_SITE_OTHER): Payer: BLUE CROSS/BLUE SHIELD | Admitting: Neurology

## 2016-04-14 DIAGNOSIS — R569 Unspecified convulsions: Secondary | ICD-10-CM

## 2016-04-14 DIAGNOSIS — G43109 Migraine with aura, not intractable, without status migrainosus: Secondary | ICD-10-CM

## 2016-04-22 ENCOUNTER — Telehealth: Payer: Self-pay | Admitting: Neurology

## 2016-04-22 NOTE — Telephone Encounter (Signed)
Patient called to request results of EEG

## 2016-04-23 ENCOUNTER — Encounter: Payer: Self-pay | Admitting: Neurology

## 2016-04-23 NOTE — Telephone Encounter (Signed)
EEG was normal. I sent result through [facility] chart email to patient already

## 2016-04-23 NOTE — Procedures (Signed)
   HISTORY:   30 years old female, with history of partial seizure seizure with secondary generalization   TECHNIQUE:  16 channel EEG was performed based on standard 10-16 international system. One channel was dedicated to EKG, which has demonstrates normal sinus rhythm of  84 beats per minutes.  Upon awakening, the posterior background activity was well-developed, in alpha range10 Hz,, reactive to eye opening and closure.  There was no evidence of epileptiform discharge.  Photic stimulation was performed, which induced a symmetric photic driving.  Hyperventilation was performed, there was no abnormality elicit.  No sleep was achieved.  CONCLUSION: This is a  normal  awake EEG.  There is no electrodiagnostic evidence of epileptiform discharge

## 2016-05-01 DIAGNOSIS — R87615 Unsatisfactory cytologic smear of cervix: Secondary | ICD-10-CM | POA: Diagnosis not present

## 2016-05-01 DIAGNOSIS — Z113 Encounter for screening for infections with a predominantly sexual mode of transmission: Secondary | ICD-10-CM | POA: Diagnosis not present

## 2016-05-01 DIAGNOSIS — F329 Major depressive disorder, single episode, unspecified: Secondary | ICD-10-CM | POA: Diagnosis not present

## 2016-05-01 DIAGNOSIS — Z124 Encounter for screening for malignant neoplasm of cervix: Secondary | ICD-10-CM | POA: Diagnosis not present

## 2016-05-01 DIAGNOSIS — Z0000 Encounter for general adult medical examination without abnormal findings: Secondary | ICD-10-CM | POA: Diagnosis not present

## 2016-05-02 DIAGNOSIS — Z0000 Encounter for general adult medical examination without abnormal findings: Secondary | ICD-10-CM | POA: Diagnosis not present

## 2016-05-06 ENCOUNTER — Telehealth: Payer: Self-pay | Admitting: *Deleted

## 2016-05-06 ENCOUNTER — Ambulatory Visit: Payer: BLUE CROSS/BLUE SHIELD | Admitting: Neurology

## 2016-05-06 NOTE — Telephone Encounter (Signed)
No show - arrived to the office without co-pay - had to reschedule.

## 2016-06-23 ENCOUNTER — Telehealth: Payer: Self-pay | Admitting: *Deleted

## 2016-06-23 ENCOUNTER — Ambulatory Visit: Payer: BLUE CROSS/BLUE SHIELD | Admitting: Neurology

## 2016-06-23 NOTE — Telephone Encounter (Signed)
No showed follow up appointment. 

## 2016-06-25 ENCOUNTER — Encounter: Payer: Self-pay | Admitting: Neurology

## 2016-08-07 DIAGNOSIS — F331 Major depressive disorder, recurrent, moderate: Secondary | ICD-10-CM | POA: Diagnosis not present

## 2016-08-07 DIAGNOSIS — M545 Low back pain: Secondary | ICD-10-CM | POA: Diagnosis not present

## 2016-08-07 DIAGNOSIS — R87810 Cervical high risk human papillomavirus (HPV) DNA test positive: Secondary | ICD-10-CM | POA: Diagnosis not present

## 2016-08-07 DIAGNOSIS — Z6826 Body mass index (BMI) 26.0-26.9, adult: Secondary | ICD-10-CM | POA: Diagnosis not present

## 2016-08-18 ENCOUNTER — Ambulatory Visit
Admission: RE | Admit: 2016-08-18 | Discharge: 2016-08-18 | Disposition: A | Discharge status: Home / Self Care | Payer: BLUE CROSS/BLUE SHIELD | Source: Ambulatory Visit | Attending: Family Medicine | Admitting: Family Medicine

## 2016-08-18 ENCOUNTER — Other Ambulatory Visit: Payer: Self-pay | Admitting: Family Medicine

## 2016-08-18 DIAGNOSIS — M545 Low back pain: Secondary | ICD-10-CM | POA: Diagnosis not present

## 2016-08-18 DIAGNOSIS — R0781 Pleurodynia: Secondary | ICD-10-CM | POA: Diagnosis not present

## 2016-09-17 DIAGNOSIS — M545 Low back pain: Secondary | ICD-10-CM | POA: Diagnosis not present

## 2016-12-11 DIAGNOSIS — M545 Low back pain: Secondary | ICD-10-CM | POA: Diagnosis not present

## 2016-12-11 DIAGNOSIS — F331 Major depressive disorder, recurrent, moderate: Secondary | ICD-10-CM | POA: Diagnosis not present

## 2017-02-20 DIAGNOSIS — M62838 Other muscle spasm: Secondary | ICD-10-CM | POA: Diagnosis not present

## 2017-02-20 DIAGNOSIS — F064 Anxiety disorder due to known physiological condition: Secondary | ICD-10-CM | POA: Diagnosis not present

## 2017-02-20 DIAGNOSIS — F331 Major depressive disorder, recurrent, moderate: Secondary | ICD-10-CM | POA: Diagnosis not present

## 2017-03-04 DIAGNOSIS — M25532 Pain in left wrist: Secondary | ICD-10-CM | POA: Diagnosis not present

## 2017-03-04 DIAGNOSIS — M542 Cervicalgia: Secondary | ICD-10-CM | POA: Diagnosis not present

## 2017-03-04 DIAGNOSIS — M25531 Pain in right wrist: Secondary | ICD-10-CM | POA: Diagnosis not present

## 2017-03-22 ENCOUNTER — Encounter (HOSPITAL_COMMUNITY): Payer: Self-pay | Admitting: *Deleted

## 2017-03-22 ENCOUNTER — Emergency Department (HOSPITAL_COMMUNITY): Payer: BLUE CROSS/BLUE SHIELD

## 2017-03-22 ENCOUNTER — Emergency Department (HOSPITAL_COMMUNITY)
Admission: EM | Admit: 2017-03-22 | Discharge: 2017-03-22 | Disposition: A | Discharge status: Home / Self Care | Payer: BLUE CROSS/BLUE SHIELD | Attending: Emergency Medicine | Admitting: Emergency Medicine

## 2017-03-22 DIAGNOSIS — B349 Viral infection, unspecified: Secondary | ICD-10-CM | POA: Diagnosis not present

## 2017-03-22 DIAGNOSIS — F17210 Nicotine dependence, cigarettes, uncomplicated: Secondary | ICD-10-CM | POA: Insufficient documentation

## 2017-03-22 DIAGNOSIS — Z7982 Long term (current) use of aspirin: Secondary | ICD-10-CM | POA: Diagnosis not present

## 2017-03-22 DIAGNOSIS — R079 Chest pain, unspecified: Secondary | ICD-10-CM | POA: Diagnosis not present

## 2017-03-22 DIAGNOSIS — R52 Pain, unspecified: Secondary | ICD-10-CM | POA: Diagnosis not present

## 2017-03-22 DIAGNOSIS — R0789 Other chest pain: Secondary | ICD-10-CM | POA: Diagnosis not present

## 2017-03-22 LAB — I-STAT TROPONIN, ED: Troponin i, poc: 0 ng/mL (ref 0.00–0.08)

## 2017-03-22 LAB — BASIC METABOLIC PANEL
Anion gap: 9 (ref 5–15)
BUN: 5 mg/dL — ABNORMAL LOW (ref 6–20)
CHLORIDE: 106 mmol/L (ref 101–111)
CO2: 19 mmol/L — ABNORMAL LOW (ref 22–32)
Calcium: 9.1 mg/dL (ref 8.9–10.3)
Creatinine, Ser: 0.65 mg/dL (ref 0.44–1.00)
Glucose, Bld: 113 mg/dL — ABNORMAL HIGH (ref 65–99)
POTASSIUM: 3.7 mmol/L (ref 3.5–5.1)
SODIUM: 134 mmol/L — AB (ref 135–145)

## 2017-03-22 LAB — CBC
HEMATOCRIT: 40.7 % (ref 36.0–46.0)
Hemoglobin: 13.6 g/dL (ref 12.0–15.0)
MCH: 31.7 pg (ref 26.0–34.0)
MCHC: 33.4 g/dL (ref 30.0–36.0)
MCV: 94.9 fL (ref 78.0–100.0)
PLATELETS: 344 10*3/uL (ref 150–400)
RBC: 4.29 MIL/uL (ref 3.87–5.11)
RDW: 13.4 % (ref 11.5–15.5)
WBC: 11.3 10*3/uL — AB (ref 4.0–10.5)

## 2017-03-22 LAB — CK: Total CK: 121 U/L (ref 38–234)

## 2017-03-22 LAB — URINALYSIS, ROUTINE W REFLEX MICROSCOPIC
Bacteria, UA: NONE SEEN
Bilirubin Urine: NEGATIVE
GLUCOSE, UA: NEGATIVE mg/dL
Ketones, ur: 5 mg/dL — AB
NITRITE: NEGATIVE
PH: 6 (ref 5.0–8.0)
PROTEIN: 30 mg/dL — AB
Specific Gravity, Urine: 1.027 (ref 1.005–1.030)

## 2017-03-22 LAB — D-DIMER, QUANTITATIVE (NOT AT ARMC)

## 2017-03-22 LAB — PREGNANCY, URINE: PREG TEST UR: NEGATIVE

## 2017-03-22 MED ORDER — IBUPROFEN 600 MG PO TABS: 600.0000 mg | ORAL_TABLET | Freq: Four times a day (QID) | ORAL | 0 refills | Status: DC | PRN

## 2017-03-22 MED ORDER — ONDANSETRON 4 MG PO TBDP
4.0000 mg | ORAL_TABLET | Freq: Once | ORAL | Status: AC
  Administered 2017-03-22: 4 mg via ORAL
  Filled 2017-03-22: qty 1

## 2017-03-22 MED ORDER — ONDANSETRON HCL 4 MG PO TABS: 4.0000 mg | ORAL_TABLET | Freq: Four times a day (QID) | ORAL | 0 refills | Status: DC | PRN

## 2017-03-22 MED ORDER — IBUPROFEN 800 MG PO TABS
800.0000 mg | ORAL_TABLET | Freq: Once | ORAL | Status: AC
  Administered 2017-03-22: 800 mg via ORAL
  Filled 2017-03-22: qty 1

## 2017-03-22 NOTE — Discharge Instructions (Signed)
Chest pain is consistent with chest wall pain. Your workup today is reassuring, and her presentation seems consistent with that of likely a flulike illness. Please take Tylenol and ibuprofen for pain and fevers. Please return if fluids and get rest. Return without fail for worsening symptoms, including confusion, intractable vomiting, sliding pain, or any other symptoms concerning to you.

## 2017-03-22 NOTE — ED Notes (Signed)
ED Provider at bedside. 

## 2017-03-22 NOTE — ED Provider Notes (Signed)
[facility] DEPT Provider Note   CSN: [id number] Arrival date & time: 03/22/17  1339  By signing my name below, I, Dora Sims, attest that this documentation has been prepared under the direction and in the presence of physician practitioner, Forde Dandy, MD. Electronically Signed: Dora Sims, Scribe. 03/22/2017. 4:40 PM.  History   Chief Complaint Chief Complaint  Patient presents with  . Chest Pain    The history is provided by the patient. No language interpreter was used.     HPI Comments: Virginia Bradley is a 31 y.o. female with PMHx including epilepsy who presents to the Emergency Department complaining of constant left-sided chest pain beginning yesterday. She reports associated cramping body aches that are most significant in her legs, chills, nausea, urinary frequency, nasal congestion, mild SOB, and an occasional non-productive cough. She has had several episodes of vomiting since onset of her symptoms and last vomited earlier today. Pt states her chest pain is exacerbated by movement and respiration. No alleviating factors noted and no medications/treatments tried. No known sick contacts. No h/o PE or DVT. No pertinent cardiac FMHx. She denies recent over-exertion, increased activity, or heavy lifting. Pt further denies fevers, diarrhea, leg swelling, dysuria, sore throat, or any other associated symptoms.   Past Medical History:  Diagnosis Date  . Headache   . Seizures [facility])     Patient Active Problem List   Diagnosis Date Noted  . Seizures ([place]) 03/24/2016  . Migraine 03/24/2016    Past Surgical History:  Procedure Laterality Date  . APPENDECTOMY    . ovary removed      OB History    No data available       Home Medications    Prior to Admission medications   Medication Sig Start Date End Date Taking? Authorizing Provider  aspirin-acetaminophen-caffeine (EXCEDRIN MIGRAINE) [phone] MG tablet Take 1 tablet by mouth every 6 (six) hours as needed  for headache.    Historical Provider, MD  ibuprofen (ADVIL,MOTRIN) 600 MG tablet Take 1 tablet (600 mg total) by mouth every 6 (six) hours as needed. 03/22/17   Forde Dandy, MD  ondansetron (ZOFRAN) 4 MG tablet Take 1 tablet (4 mg total) by mouth every 6 (six) hours as needed for nausea or vomiting. 03/22/17   Forde Dandy, MD  SUMAtriptan (IMITREX) 50 MG tablet Take 1 tablet (50 mg total) by mouth once as needed for migraine. May repeat in 2 hours if headache persists or recurs. 03/24/16   Marcial Pacas, MD  topiramate (TOPAMAX) 100 MG tablet Take 1 tablet (100 mg total) by mouth 2 (two) times daily. 03/24/16   Marcial Pacas, MD    Family History Family History  Problem Relation Age of Onset  . Seizures Mother   . Hypertension Maternal Grandmother     Social History Social History  Substance Use Topics  . Smoking status: Current Some Day Smoker    Packs/day: 0.50    Types: Cigarettes  . Smokeless tobacco: Not on file  . Alcohol use No     Allergies   Depakote er [divalproex sodium er]   Review of Systems Review of Systems  10/14 systems reviewed and are negative other than those stated in the HPI  Physical Exam Updated Vital Signs BP 105/64   Pulse 93   Temp 100.1 F (37.8 C) (Oral)   Resp 17   SpO2 99%   Physical Exam Physical Exam  Nursing note and vitals reviewed. Constitutional: Well developed, well nourished, non-toxic, and  in no acute distress Head: Normocephalic and atraumatic.  Mouth/Throat: Oropharynx is clear and moist.  Neck: Normal range of motion. Neck supple.  Cardiovascular: Normal rate and regular rhythm.   Pulmonary/Chest: Effort normal and breath sounds normal. TTP over left anterior chest wall. Abdominal: Soft. There is no tenderness. There is no rebound and no guarding.  Musculoskeletal: Normal range of motion. Diffuse muscular tenderness. No edema. Neurological: Alert, no facial droop, fluent speech, moves all extremities symmetrically Skin: Skin is warm  and dry.  Psychiatric: Cooperative  ED Treatments / Results  Labs (all labs ordered are listed, but only abnormal results are displayed) Labs Reviewed  BASIC METABOLIC PANEL - Abnormal; Notable for the following:       Result Value   Sodium 134 (*)    CO2 19 (*)    Glucose, Bld 113 (*)    BUN 5 (*)    All other components within normal limits  CBC - Abnormal; Notable for the following:    WBC 11.3 (*)    All other components within normal limits  URINALYSIS, ROUTINE W REFLEX MICROSCOPIC - Abnormal; Notable for the following:    Color, Urine AMBER (*)    APPearance CLOUDY (*)    Hgb urine dipstick MODERATE (*)    Ketones, ur 5 (*)    Protein, ur 30 (*)    Leukocytes, UA TRACE (*)    Squamous Epithelial / LPF 6-30 (*)    All other components within normal limits  PREGNANCY, URINE  CK  D-DIMER, QUANTITATIVE (NOT AT [facility])  I-STAT TROPOININ, ED  I-STAT BETA HCG BLOOD, ED ([facility], [facility], [facility] ONLY)    EKG  EKG Interpretation  Date/Time:  Sunday March 22 2017 13:45:05 EDT Ventricular Rate:  116 PR Interval:  124 QRS Duration: 68 QT Interval:  284 QTC Calculation: 394 R Axis:   97 Text Interpretation:  Sinus tachycardia Rightward axis T wave abnormality, consider inferior ischemia Abnormal ECG Confirmed by Vida Nicol MD, Lea Baine [id number]) on 03/22/2017 4:46:42 PM       Radiology Dg Chest 2 View  Result Date: 03/22/2017 CLINICAL DATA:  Left upper chest pain since yesterday. EXAM: CHEST  2 VIEW COMPARISON:  08/18/2016. FINDINGS: Normal sized heart. Clear lungs with normal vascularity. Mild diffuse peribronchial thickening. Minimal lower thoracic spine degenerative changes. IMPRESSION: Mild bronchitic changes. Electronically Signed   By: Claudie Revering M.D.   On: 03/22/2017 14:25    Procedures Procedures (including critical care time)  DIAGNOSTIC STUDIES: Oxygen Saturation is 99% on RA, normal by my interpretation.    COORDINATION OF CARE: 4:49 PM Discussed treatment plan with pt at bedside  and pt agreed to plan.  Medications Ordered in ED Medications  ibuprofen (ADVIL,MOTRIN) tablet 800 mg (800 mg Oral Given 03/22/17 1720)  ondansetron (ZOFRAN-ODT) disintegrating tablet 4 mg (4 mg Oral Given 03/22/17 1720)     Initial Impression / Assessment and Plan / ED Course  I have reviewed the triage vital signs and the nursing notes.  Pertinent labs & imaging results that were available during my care of the patient were reviewed by me and considered in my medical decision making (see chart for details).     Presents with chest pain, bilateral lower extremity cramping over past day. Seems likely c/w with likely flu like illness as with cough, runny nose, chills as well. Is tachycardic with low grade fever 100.85F. Chest pain reproduced on exam. Does have new RAD on EKG, but no ischemic changes. No risk factors for  PE, but given the right axis, ddimer was sent to rule out PE. This is negative and ruled out for PE. Atypical for acs and very low risk. Troponin in triage normal. Given supportive care and feels improved. Blood work reassuring. CXR visualized and w/o pneumonia Or other acute cardio pulmonary processes. Tolerating by mouth intake without difficulty. Patient to continue supportive care for likely viral illness. Strict return and follow-up instructions reviewed. She expressed understanding of all discharge instructions and felt comfortable with the plan of care.   Final Clinical Impressions(s) / ED Diagnoses   Final diagnoses:  Chest wall pain  Viral illness    New Prescriptions New Prescriptions   IBUPROFEN (ADVIL,MOTRIN) 600 MG TABLET    Take 1 tablet (600 mg total) by mouth every 6 (six) hours as needed.   ONDANSETRON (ZOFRAN) 4 MG TABLET    Take 1 tablet (4 mg total) by mouth every 6 (six) hours as needed for nausea or vomiting.   I personally performed the services described in this documentation, which was scribed in my presence. The recorded information has been reviewed  and is accurate.    Forde Dandy, MD 03/22/17 [phone]

## 2017-03-22 NOTE — ED Triage Notes (Signed)
Pt reports bilateral upper leg pain and left upper chest pain that started yesterday. Pt also reports a cough.

## 2017-05-23 DIAGNOSIS — F331 Major depressive disorder, recurrent, moderate: Secondary | ICD-10-CM | POA: Diagnosis not present

## 2017-05-23 DIAGNOSIS — L2089 Other atopic dermatitis: Secondary | ICD-10-CM | POA: Diagnosis not present

## 2017-05-23 DIAGNOSIS — R87810 Cervical high risk human papillomavirus (HPV) DNA test positive: Secondary | ICD-10-CM | POA: Diagnosis not present

## 2017-05-23 DIAGNOSIS — F064 Anxiety disorder due to known physiological condition: Secondary | ICD-10-CM | POA: Diagnosis not present

## 2017-09-22 ENCOUNTER — Emergency Department (HOSPITAL_COMMUNITY)
Admission: EM | Admit: 2017-09-22 | Discharge: 2017-09-22 | Disposition: A | Discharge status: Home / Self Care | Payer: BLUE CROSS/BLUE SHIELD | Attending: Emergency Medicine | Admitting: Emergency Medicine

## 2017-09-22 ENCOUNTER — Encounter (HOSPITAL_COMMUNITY): Payer: Self-pay | Admitting: Emergency Medicine

## 2017-09-22 ENCOUNTER — Emergency Department (HOSPITAL_COMMUNITY): Payer: BLUE CROSS/BLUE SHIELD

## 2017-09-22 DIAGNOSIS — Z8669 Personal history of other diseases of the nervous system and sense organs: Secondary | ICD-10-CM | POA: Insufficient documentation

## 2017-09-22 DIAGNOSIS — Z7982 Long term (current) use of aspirin: Secondary | ICD-10-CM | POA: Diagnosis not present

## 2017-09-22 DIAGNOSIS — R531 Weakness: Secondary | ICD-10-CM | POA: Insufficient documentation

## 2017-09-22 DIAGNOSIS — R569 Unspecified convulsions: Secondary | ICD-10-CM | POA: Diagnosis not present

## 2017-09-22 DIAGNOSIS — Z79899 Other long term (current) drug therapy: Secondary | ICD-10-CM | POA: Diagnosis not present

## 2017-09-22 DIAGNOSIS — Z9114 Patient's other noncompliance with medication regimen: Secondary | ICD-10-CM | POA: Insufficient documentation

## 2017-09-22 DIAGNOSIS — F17210 Nicotine dependence, cigarettes, uncomplicated: Secondary | ICD-10-CM | POA: Diagnosis not present

## 2017-09-22 DIAGNOSIS — R51 Headache: Secondary | ICD-10-CM | POA: Diagnosis not present

## 2017-09-22 LAB — CBC WITH DIFFERENTIAL/PLATELET
BASOS PCT: 0 %
Basophils Absolute: 0 10*3/uL (ref 0.0–0.1)
EOS ABS: 0.2 10*3/uL (ref 0.0–0.7)
EOS PCT: 2 %
HCT: 37.8 % (ref 36.0–46.0)
HEMOGLOBIN: 13.1 g/dL (ref 12.0–15.0)
LYMPHS ABS: 2.5 10*3/uL (ref 0.7–4.0)
Lymphocytes Relative: 27 %
MCH: 32 pg (ref 26.0–34.0)
MCHC: 34.7 g/dL (ref 30.0–36.0)
MCV: 92.4 fL (ref 78.0–100.0)
MONO ABS: 0.6 10*3/uL (ref 0.1–1.0)
MONOS PCT: 6 %
Neutro Abs: 6 10*3/uL (ref 1.7–7.7)
Neutrophils Relative %: 65 %
Platelets: 317 10*3/uL (ref 150–400)
RBC: 4.09 MIL/uL (ref 3.87–5.11)
RDW: 13.4 % (ref 11.5–15.5)
WBC: 9.1 10*3/uL (ref 4.0–10.5)

## 2017-09-22 LAB — BASIC METABOLIC PANEL
Anion gap: 5 (ref 5–15)
BUN: 10 mg/dL (ref 6–20)
CALCIUM: 8.9 mg/dL (ref 8.9–10.3)
CHLORIDE: 106 mmol/L (ref 101–111)
CO2: 24 mmol/L (ref 22–32)
CREATININE: 0.73 mg/dL (ref 0.44–1.00)
GFR calc non Af Amer: >60 mL/min (ref >60)
Glucose, Bld: 86 mg/dL (ref 65–99)
Potassium: 5.2 mmol/L — ABNORMAL HIGH (ref 3.5–5.1)
SODIUM: 135 mmol/L (ref 135–145)

## 2017-09-22 LAB — CBG MONITORING, ED: Glucose-Capillary: 97 mg/dL (ref 65–99)

## 2017-09-22 LAB — I-STAT BETA HCG BLOOD, ED (MC, WL, AP ONLY): I-stat hCG, quantitative: <5 m[IU]/mL (ref <5)

## 2017-09-22 MED ORDER — PROCHLORPERAZINE EDISYLATE 5 MG/ML IJ SOLN
10.0000 mg | Freq: Once | INTRAMUSCULAR | Status: AC
  Administered 2017-09-22: 10 mg via INTRAVENOUS
  Filled 2017-09-22: qty 2

## 2017-09-22 MED ORDER — SODIUM CHLORIDE 0.9 % IV BOLUS (SEPSIS)
1000.0000 mL | Freq: Once | INTRAVENOUS | Status: AC
  Administered 2017-09-22: 1000 mL via INTRAVENOUS

## 2017-09-22 MED ORDER — DIPHENHYDRAMINE HCL 50 MG/ML IJ SOLN
12.5000 mg | Freq: Once | INTRAMUSCULAR | Status: AC
  Administered 2017-09-22: 12.5 mg via INTRAVENOUS
  Filled 2017-09-22: qty 1

## 2017-09-22 MED ORDER — SODIUM CHLORIDE 0.9 % IV SOLN
1000.0000 mg | Freq: Once | INTRAVENOUS | Status: AC
  Administered 2017-09-22: 1000 mg via INTRAVENOUS
  Filled 2017-09-22: qty 10

## 2017-09-22 MED ORDER — DEXAMETHASONE SODIUM PHOSPHATE 10 MG/ML IJ SOLN
10.0000 mg | Freq: Once | INTRAMUSCULAR | Status: AC
  Administered 2017-09-22: 10 mg via INTRAVENOUS
  Filled 2017-09-22: qty 1

## 2017-09-22 MED ORDER — LEVETIRACETAM 500 MG PO TABS: 500.0000 mg | ORAL_TABLET | Freq: Two times a day (BID) | ORAL | 0 refills | Status: DC

## 2017-09-22 NOTE — ED Triage Notes (Signed)
Per [facility] patient from work where called out for having two seizures today. Pt has PMH of seizures with last seizure unknown prior to today. Patient not currently taking any seizure medications. Patient became alert to self and time at scene per EMS. Patient has 20g in left hand.

## 2017-09-22 NOTE — ED Notes (Signed)
Patient transported to MRI 

## 2017-09-22 NOTE — ED Notes (Signed)
Bed: WA21 Expected date:  Expected time:  Means of arrival:  Comments: EMS  

## 2017-09-22 NOTE — ED Notes (Signed)
Pt ambulatory and independent at discharge.  Verbalized understanding of discharge instructions 

## 2017-09-22 NOTE — Discharge Instructions (Signed)
Please start taking Keppra, follow with [facility] neurology. Don't hesitate to return to the emergency department for any new or worsening symptoms.

## 2017-09-22 NOTE — ED Provider Notes (Signed)
3:50 PM BP 123/77   Pulse 75   Temp 99.8 F (37.7 C) (Oral)   Resp 19   LMP 09/13/2017   SpO2 100%   Patient taken in signout from [place]. The patient has a hx epilepsy. She has been noncompliant with her medications due to side effects. The patient had 2 seizures while at work today. 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 She has an abnormal headache today with her seizure and has had persistent left sided weakness since the onset of her seizure causing her to be unable to ambulate safely. awaiting MRI.    Patient's MRI is negative for acute abnormality. She feels greatly improved. She is ambulatory to the bathroom on her own and appears safe for discharge at this time. We'll start the patient back on Keppra which she prefers she will follow up with neurology discussed return precautions.     Margarita Mail, PA-C 09/23/17 0115    Jola Schmidt, MD 09/23/17 [phone]

## 2017-09-22 NOTE — ED Notes (Signed)
ED Provider at bedside. 

## 2017-09-22 NOTE — ED Notes (Signed)
Pt returned from MRI °

## 2017-09-22 NOTE — ED Notes (Signed)
Patient ambulated to bathroom from room 21 with no assist. Patient tolerated well.

## 2017-09-22 NOTE — ED Provider Notes (Signed)
[facility] DEPT Provider Note   CSN: [id number] Arrival date & time: 09/22/17  1231     History   Chief Complaint Chief Complaint  Patient presents with  . Seizures    HPI   Virginia Bradley is a 31 y.o. 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 No report of length of seizures. Pt has no recollection. Her employer who accompanies her states that she told a coworker that she felt like she was going to have a seizure and she laid down. She explained that she normally feels like she has the taste pennies and see spots in her vision. Patient does not recall this conversation, the last thing she remembers this morning was getting ready to drive to work she does not remember being work. She has not taken her epileptic medication for several years because she hasn't had any seizures. She states that she had been in her normal state of health but had a headache over the last several days it was not alleviated with Tylenol. He states that the headache was on the right forehead radiating around to the right occipital area for her she states that this is not her typical headache. She denies fever, chills, nausea, vomiting, chest pain, shortness of breath. She states that there was a disagreement over the side effects of Depakote and that why she stopped taking her medication in addition to the fact that she has not had a seizure and so long.  Past Medical History:  Diagnosis Date  . Headache   . Seizures [facility])     Patient Active Problem List   Diagnosis Date Noted  . Seizures ([place]) 03/24/2016  . Migraine 03/24/2016    Past Surgical History:  Procedure Laterality Date  . APPENDECTOMY    . ovary removed      OB History    No data available       Home Medications    Prior to Admission medications   Medication Sig Start Date End Date Taking? Authorizing Provider  mirtazapine (REMERON) 15 MG tablet  Take 15 mg by mouth at bedtime.  08/22/17  Yes [provider]  aspirin-acetaminophen-caffeine (EXCEDRIN MIGRAINE) [phone] MG tablet Take 1 tablet by mouth every 6 (six) hours as needed for headache.    [provider]  ibuprofen (ADVIL,MOTRIN) 600 MG tablet Take 1 tablet (600 mg total) by mouth every 6 (six) hours as needed. Patient not taking: Reported on 09/22/2017 03/22/17   Forde Dandy, MD  levETIRAcetam (KEPPRA) 500 MG tablet Take 1 tablet (500 mg total) by mouth 2 (two) times daily. 09/22/17   Taisley Mordan, Elmyra Ricks, PA-C  ondansetron (ZOFRAN) 4 MG tablet Take 1 tablet (4 mg total) by mouth every 6 (six) hours as needed for nausea or vomiting. Patient not taking: Reported on 09/22/2017 03/22/17   Forde Dandy, MD  SUMAtriptan (IMITREX) 50 MG tablet Take 1 tablet (50 mg total) by mouth once as needed for migraine. May repeat in 2 hours if headache persists or recurs. Patient not taking: Reported on 09/22/2017 03/24/16   Marcial Pacas, MD  topiramate (TOPAMAX) 100 MG tablet Take 1 tablet (100 mg total) by mouth 2 (two) times daily. Patient not taking: Reported on 09/22/2017 03/24/16   Marcial Pacas, MD    Family History Family History  Problem Relation Age of Onset  . Seizures Mother   . Hypertension Maternal Grandmother     Social  History Social History  Substance Use Topics  . Smoking status: Current Some Day Smoker    Packs/day: 0.50    Types: Cigarettes  . Smokeless tobacco: Never Used  . Alcohol use No     Allergies   Depakote er [divalproex sodium er]   Review of Systems Review of Systems  A complete review of systems was obtained and all systems are negative except as noted in the HPI and PMH.   Physical Exam Updated Vital Signs  Physical Exam  Constitutional: She is oriented to person, place, and time. She appears well-developed and well-nourished. No distress.  HENT:  Head: Normocephalic and atraumatic.  Mouth/Throat: Oropharynx is clear and moist.  Eyes:  Pupils are equal, round, and reactive to light. Conjunctivae and EOM are normal.  Neck: Normal range of motion.  Cardiovascular: Normal rate, regular rhythm and intact distal pulses.   Pulmonary/Chest: Effort normal and breath sounds normal.  Abdominal: Soft. There is no tenderness.  Musculoskeletal: Normal range of motion.  Neurological: She is alert and oriented to person, place, and time.  Slow to respond, oriented 3. 2 out of 5 grip strength, biceps triceps on the left. Patient also has weakness on the left lower extremity. Patient states this is typical for postictal stat.   Skin: She is not diaphoretic.  Psychiatric: She has a normal mood and affect.  Nursing note and vitals reviewed.    ED Treatments / Results  Labs (all labs ordered are listed, but only abnormal results are displayed) Labs Reviewed  BASIC METABOLIC PANEL - Abnormal; Notable for the following:       Result Value   Potassium 5.2 (*)    All other components within normal limits  CBC WITH DIFFERENTIAL/PLATELET  CBG MONITORING, ED  I-STAT BETA HCG BLOOD, ED ([facility], [facility], [facility] ONLY)    EKG  EKG Interpretation None       Radiology No results found.  Procedures Procedures (including critical care time)  Medications Ordered in ED Medications  sodium chloride 0.9 % bolus 1,000 mL (1,000 mLs Intravenous New Bag/Given 09/22/17 1552)  levETIRAcetam (KEPPRA) 1,000 mg in sodium chloride 0.9 % 100 mL IVPB (0 mg Intravenous Stopped 09/22/17 1419)  prochlorperazine (COMPAZINE) injection 10 mg (10 mg Intravenous Given 09/22/17 1552)  dexamethasone (DECADRON) injection 10 mg (10 mg Intravenous Given 09/22/17 1552)  diphenhydrAMINE (BENADRYL) injection 12.5 mg (12.5 mg Intravenous Given 09/22/17 1552)     Initial Impression / Assessment and Plan / ED Course  I have reviewed the triage vital signs and the nursing notes.  Pertinent labs & imaging results that were available during my care of the patient were reviewed by me  and considered in my medical decision making (see chart for details).     Vitals:   09/22/17 1309 09/22/17 1500  BP: 110/71 123/77  Pulse: 82 75  Resp: 13 19  Temp: 99.8 F (37.7 C)   TempSrc: Oral   SpO2: 98% 100%    Medications  sodium chloride 0.9 % bolus 1,000 mL (1,000 mLs Intravenous New Bag/Given 09/22/17 1552)  levETIRAcetam (KEPPRA) 1,000 mg in sodium chloride 0.9 % 100 mL IVPB (0 mg Intravenous Stopped 09/22/17 1419)  prochlorperazine (COMPAZINE) injection 10 mg (10 mg Intravenous Given 09/22/17 1552)  dexamethasone (DECADRON) injection 10 mg (10 mg Intravenous Given 09/22/17 1552)  diphenhydrAMINE (BENADRYL) injection 12.5 mg (12.5 mg Intravenous Given 09/22/17 1552)    Virginia Bradley is 31 y.o. female presenting with 2 observed seizures this morning. She has a  history of epilepsy, she's been noncompliant with her Depakote due to issues with the side effects. She has a left-sided upper and lower extremity weakness which patient states is typical post seizure. Neurologic exam is otherwise nonfocal. She had a normal MRI in April 2017.  Blood work reassuring, patient remains with left-sided weakness, discussed with neuro hospitalist Dr. Malen Gauze who recommends giving headache COCKTAIL for possible complicated migraine. Patient has almost fallen when she has gone to the bathroom, the seizure was 11 AM, given the length of time that she has these focal symptoms I will obtain an MRI to further evaluate.  Case signed out to PA Harris at shift change: Plan to follow-up MRI, reassess left-sided weakness and if normal and patient can ambulate discharged home with follow-up at [facility] neurology with [place] for seizure prevention.    Final Clinical Impressions(s) / ED Diagnoses   Final diagnoses:  None    New Prescriptions New Prescriptions   LEVETIRACETAM (KEPPRA) 500 MG TABLET    Take 1 tablet (500 mg total) by mouth 2 (two) times daily.     Monico Blitz, Hershal Coria 09/22/17  1608    Tegeler, Gwenyth Allegra, MD 09/22/17 [phone]

## 2017-09-22 NOTE — ED Notes (Signed)
MRI coming in 10 minutes to retrieve patient.  Pt stated she is not claustrophobic.

## 2017-09-22 NOTE — ED Notes (Signed)
Patient currently in MRI. Vital signs will be updated once patient returns from MRI.

## 2017-10-24 DIAGNOSIS — F064 Anxiety disorder due to known physiological condition: Secondary | ICD-10-CM | POA: Diagnosis not present

## 2017-10-24 DIAGNOSIS — F331 Major depressive disorder, recurrent, moderate: Secondary | ICD-10-CM | POA: Diagnosis not present

## 2017-10-24 DIAGNOSIS — G40019 Localization-related (focal) (partial) idiopathic epilepsy and epileptic syndromes with seizures of localized onset, intractable, without status epilepticus: Secondary | ICD-10-CM | POA: Diagnosis not present

## 2017-12-05 DIAGNOSIS — F331 Major depressive disorder, recurrent, moderate: Secondary | ICD-10-CM | POA: Diagnosis not present

## 2017-12-05 DIAGNOSIS — G40019 Localization-related (focal) (partial) idiopathic epilepsy and epileptic syndromes with seizures of localized onset, intractable, without status epilepticus: Secondary | ICD-10-CM | POA: Diagnosis not present

## 2017-12-05 DIAGNOSIS — B373 Candidiasis of vulva and vagina: Secondary | ICD-10-CM | POA: Diagnosis not present

## 2018-01-05 DIAGNOSIS — A492 Hemophilus influenzae infection, unspecified site: Secondary | ICD-10-CM | POA: Diagnosis not present

## 2018-01-05 DIAGNOSIS — J069 Acute upper respiratory infection, unspecified: Secondary | ICD-10-CM | POA: Diagnosis not present

## 2018-03-13 DIAGNOSIS — M791 Myalgia, unspecified site: Secondary | ICD-10-CM | POA: Diagnosis not present

## 2018-03-13 DIAGNOSIS — F331 Major depressive disorder, recurrent, moderate: Secondary | ICD-10-CM | POA: Diagnosis not present

## 2018-03-13 DIAGNOSIS — G40019 Localization-related (focal) (partial) idiopathic epilepsy and epileptic syndromes with seizures of localized onset, intractable, without status epilepticus: Secondary | ICD-10-CM | POA: Diagnosis not present

## 2018-03-13 DIAGNOSIS — R5383 Other fatigue: Secondary | ICD-10-CM | POA: Diagnosis not present

## 2018-04-24 DIAGNOSIS — G40909 Epilepsy, unspecified, not intractable, without status epilepticus: Secondary | ICD-10-CM | POA: Diagnosis not present

## 2018-04-30 ENCOUNTER — Encounter: Payer: Self-pay | Admitting: Neurology

## 2018-04-30 DIAGNOSIS — G40019 Localization-related (focal) (partial) idiopathic epilepsy and epileptic syndromes with seizures of localized onset, intractable, without status epilepticus: Secondary | ICD-10-CM | POA: Diagnosis not present

## 2018-04-30 DIAGNOSIS — F331 Major depressive disorder, recurrent, moderate: Secondary | ICD-10-CM | POA: Diagnosis not present

## 2018-04-30 DIAGNOSIS — J301 Allergic rhinitis due to pollen: Secondary | ICD-10-CM | POA: Diagnosis not present

## 2018-06-17 DIAGNOSIS — N760 Acute vaginitis: Secondary | ICD-10-CM | POA: Diagnosis not present

## 2018-06-17 DIAGNOSIS — Z01419 Encounter for gynecological examination (general) (routine) without abnormal findings: Secondary | ICD-10-CM | POA: Diagnosis not present

## 2018-06-17 DIAGNOSIS — Z6829 Body mass index (BMI) 29.0-29.9, adult: Secondary | ICD-10-CM | POA: Diagnosis not present

## 2018-07-13 DIAGNOSIS — N760 Acute vaginitis: Secondary | ICD-10-CM | POA: Diagnosis not present

## 2018-07-20 ENCOUNTER — Encounter: Payer: Self-pay | Admitting: Neurology

## 2018-07-20 ENCOUNTER — Other Ambulatory Visit: Payer: Self-pay

## 2018-07-20 ENCOUNTER — Ambulatory Visit: Payer: BLUE CROSS/BLUE SHIELD | Admitting: Neurology

## 2018-07-20 VITALS — BP 116/72 | HR 95 | Ht 67.0 in | Wt 188.0 lb

## 2018-07-20 DIAGNOSIS — G40009 Localization-related (focal) (partial) idiopathic epilepsy and epileptic syndromes with seizures of localized onset, not intractable, without status epilepticus: Secondary | ICD-10-CM | POA: Diagnosis not present

## 2018-07-20 DIAGNOSIS — G43009 Migraine without aura, not intractable, without status migrainosus: Secondary | ICD-10-CM

## 2018-07-20 MED ORDER — ZONISAMIDE 100 MG PO CAPS: ORAL_CAPSULE | ORAL | 6 refills | Status: DC

## 2018-07-20 NOTE — Patient Instructions (Signed)
1. Start Zonisamide 100mg : Take 1 cap every night for 2 weeks, then increase to 2 caps every night for 2 weeks, then increase to 3 caps every night and continue  2. Keep a calendar of your headaches and seizures  3. Follow-up in 3 months, call for any changes  Seizure Precautions: 1. If medication has been prescribed for you to prevent seizures, take it exactly as directed.  Do not stop taking the medicine without talking to your doctor first, even if you have not had a seizure in a long time.   2. Avoid activities in which a seizure would cause danger to yourself or to others.  Don't operate dangerous machinery, swim alone, or climb in high or dangerous places, such as on ladders, roofs, or girders.  Do not drive unless your doctor says you may.  3. If you have any warning that you may have a seizure, lay down in a safe place where you can't hurt yourself.    4.  No driving for 6 months from last seizure, as per [facility].   Please refer to the following link on the [place] website for more information: http://www.epilepsyfoundation.org/answerplace/Social/driving/drivingu.cfm   5.  Maintain good sleep hygiene. Avoid alcohol.  6.  Notify your neurology if you are planning pregnancy or if you become pregnant.  7.  Contact your doctor if you have any problems that may be related to the medicine you are taking.  8.  Call 911 and bring the patient back to the ED if:        A.  The seizure lasts longer than 5 minutes.       B.  The patient doesn't awaken shortly after the seizure  C.  The patient has new problems such as difficulty seeing, speaking or moving  D.  The patient was injured during the seizure  E.  The patient has a temperature over 102 F (39C)  F.  The patient vomited and now is having trouble breathing

## 2018-07-20 NOTE — Progress Notes (Signed)
NEUROLOGY CONSULTATION NOTE  Virginia Bradley MRN: [id number] DOB: 1986-01-10  Referring provider: Dr. Lucianne Lei Primary care provider: Dr. Lucianne Lei  Reason for consult:  seizures  Dear Dr Criss Rosales:  Thank you for your kind referral of Virginia Bradley for consultation of the above symptoms. Although her history is well known to you, please allow me to reiterate it for the purpose of our medical record. She is alone in the office today. Records and images were personally reviewed where available.  HISTORY OF PRESENT ILLNESS: This is a pleasant 32 year old right-handed woman with a history of depression, migraines, and seizures. She was born 1 month premature and reports having febrile convulsions as an infant. Seizures quieted down until 2001 in high school, when she started having generalized convulsions. Seizures since then would be preceded by bad headaches for several days, then she would have a funny taste like dirty pennies in her mouth, sometimes see spots in her vision or have occasional pins sensation in her hands and feet, then pass out with report of convulsive activity. She would have left-sided weakness after a seizure. She has had urinary incontinence with some of them. She recalls taking Dilantin, then Tegretol and Trileptal in high school, which reduced the seizures to around once a month. After she graduated from high school, she was having seizures around twice a year. She was seen by neurologist Dr. Krista Blue in April 2017 reporting side effects of drowsiness on Keppra. At that time she reported seizures every 2 years, she had a seizure in 2013, 2015, then 01/2016 found confused on the floor. 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 She had an MRI brain with and without contrast which I personally reviewed, no acute changes, hippocampi symmetric with no abnormal signal or  enhancement. Her routine EEG was normal. She was started on Topamax for seizures and headaches, she does not recall why she stopped it but did not finish the prescription. She was in the ER in October 2018 after having 2 seizures at work and was restarted on Keppra. She saw another neurologist in [facility] and expressed concern for side effects, she ran out of refills in April 2019 and has been off medication with no events for 9 months until 2 weeks ago when she was woken up at 3am with a bad headache. She recalls getting off the bed to get Excedrin, then woke up on the floor 3 hours later with her Excedrin still on the shelf. She did not feel the typical penny taste or how she feels after a seizure, but she did not feel good. Headache had improved. No tongue bite or incontinence.   She notices that she stares off but is able to respond. She lives alone but denies being told at work of any other episodes of unresponsiveness. She denies any rising epigastric sensation, myoclonic jerks. She has headaches around twice a week lasting 2-3 days. She describes an ache over the right temporal region, with occasional sensitivity to lights and sound. No nausea/vomiting. She has occasional dizziness when bending down. She has also noticed pulsatile tinnitus ("like a washing machine") when she bends down or when laying on the bed at night, more on her left ear. She has constant back pain. No diplopia, vision loss, dysarthria/dysphagia, neck pain, bowel/bladder dysfunction. Memory is good. She became tearful in the office relating her experience with prior  neurologist, as well as with trauma from her mother and brother's deaths in [place].   Epilepsy Risk Factors:  Her mother had seizures. Otherwise she had a normal birth and early development.  There is no history of febrile convulsions, CNS infections such as meningitis/encephalitis, significant traumatic brain injury, neurosurgical procedures.  Prior AEDs:  Dilantin, Tegretol, Trileptal, Depakote (itchy, lips swelled), Topamax (does not recall side effects but ddi not finish rx), Keppra  Diagnostic Data: I personally reviewed MRI brain without contrast done 09/2017 which was normal. MRI brain with and without contrast done 03/2016 no acute changes, hippocampi symmetric with no abnormal signal or enhancement seen. EEGs done at [facility] in 04/2016 was a normal wake EEG  PAST MEDICAL HISTORY: Past Medical History:  Diagnosis Date  . Headache   . Seizures ([place])     PAST SURGICAL HISTORY: Past Surgical History:  Procedure Laterality Date  . APPENDECTOMY    . ovary removed      MEDICATIONS: Current Outpatient Medications on File Prior to Visit  Medication Sig Dispense Refill  . aspirin-acetaminophen-caffeine (EXCEDRIN MIGRAINE) 250-250-65 MG tablet Take 1 tablet by mouth every 6 (six) hours as needed for headache.    . ibuprofen (ADVIL,MOTRIN) 600 MG tablet Take 1 tablet (600 mg total) by mouth every 6 (six) hours as needed. (Patient not taking: Reported on 09/22/2017) 30 tablet 0  . levETIRAcetam (KEPPRA) 500 MG tablet Take 1 tablet (500 mg total) by mouth 2 (two) times daily. 60 tablet 0  . mirtazapine (REMERON) 15 MG tablet Take 15 mg by mouth at bedtime.   2  . ondansetron (ZOFRAN) 4 MG tablet Take 1 tablet (4 mg total) by mouth every 6 (six) hours as needed for nausea or vomiting. (Patient not taking: Reported on 09/22/2017) 12 tablet 0  . SUMAtriptan (IMITREX) 50 MG tablet Take 1 tablet (50 mg total) by mouth once as needed for migraine. May repeat in 2 hours if headache persists or recurs. (Patient not taking: Reported on 09/22/2017) 12 tablet 6  . topiramate (TOPAMAX) 100 MG tablet Take 1 tablet (100 mg total) by mouth 2 (two) times daily. (Patient not taking: Reported on 09/22/2017) 60 tablet 11   Current Facility-Administered Medications on File Prior to Visit  Medication Dose Route Frequency Provider Last Rate Last Dose  . gadopentetate  dimeglumine (MAGNEVIST) injection 15 mL  15 mL Intravenous Once PRN Marcial Pacas, MD        ALLERGIES: Allergies  Allergen Reactions  . Depakote Er [Divalproex Sodium Er] Hives, Swelling and Palpitations    angioedema    FAMILY HISTORY: Family History  Problem Relation Age of Onset  . Seizures Mother   . Hypertension Maternal Grandmother     SOCIAL HISTORY: Social History   Socioeconomic History  . Marital status: Single    Spouse name: Not on file  . Number of children: 0  . Years of education: Some clg  . Highest education level: Not on file  Occupational History  . Occupation: Therapist, sports  Social Needs  . Financial resource strain: Not on file  . Food insecurity:    Worry: Not on file    Inability: Not on file  . Transportation needs:    Medical: Not on file    Non-medical: Not on file  Tobacco Use  . Smoking status: Current Some Day Smoker    Packs/day: 0.50    Types: Cigarettes  . Smokeless tobacco: Never Used  Substance and Sexual Activity  . Alcohol use:  No    Alcohol/week: 0.0 oz  . Drug use: Yes    Comment: Smokes marijuana three times monthly.  . Sexual activity: Not on file  Lifestyle  . Physical activity:    Days per week: Not on file    Minutes per session: Not on file  . Stress: Not on file  Relationships  . Social connections:    Talks on phone: Not on file    Gets together: Not on file    Attends religious service: Not on file    Active member of club or organization: Not on file    Attends meetings of clubs or organizations: Not on file    Relationship status: Not on file  . Intimate partner violence:    Fear of current or ex partner: Not on file    Emotionally abused: Not on file    Physically abused: Not on file    Forced sexual activity: Not on file  Other Topics Concern  . Not on file  Social History Narrative   Lives at home with cousin.   Right-handed.   No caffeine use.    REVIEW OF SYSTEMS: Constitutional: No  fevers, chills, or sweats, no generalized fatigue, change in appetite Eyes: No visual changes, double vision, eye pain Ear, nose and throat: No hearing loss, ear pain, nasal congestion, sore throat Cardiovascular: No chest pain, palpitations Respiratory:  No shortness of breath at rest or with exertion, wheezes GastrointestinaI: No nausea, vomiting, diarrhea, abdominal pain, fecal incontinence Genitourinary:  No dysuria, urinary retention or frequency Musculoskeletal:  No neck pain,+ back pain Integumentary: No rash, pruritus, skin lesions Neurological: as above Psychiatric: + depression, insomnia, anxiety Endocrine: No palpitations, fatigue, diaphoresis, mood swings, change in appetite, change in weight, increased thirst Hematologic/Lymphatic:  No anemia, purpura, petechiae. Allergic/Immunologic: no itchy/runny eyes, nasal congestion, recent allergic reactions, rashes  PHYSICAL EXAM: Vitals:   07/20/18 1045  BP: 116/72  Pulse: 95  SpO2: 96%   General: No acute distress Head:  Normocephalic/atraumatic Eyes: Fundoscopic exam shows bilateral sharp discs, no vessel changes, exudates, or hemorrhages Neck: supple, no paraspinal tenderness, full range of motion Back: No paraspinal tenderness Heart: regular rate and rhythm Lungs: Clear to auscultation bilaterally. Vascular: No carotid bruits. Skin/Extremities: No rash, no edema Neurological Exam: Mental status: alert and oriented to person, place, and time, no dysarthria or aphasia, Fund of knowledge is appropriate.  Recent and remote memory are intact. 2/3 delayed recall.  Attention and concentration are normal.    Able to name objects and repeat phrases.  Cranial nerves: CN I: not tested CN II: pupils equal, round and reactive to light, visual fields intact, fundi unremarkable. CN III, IV, VI:  full range of motion, no nystagmus, no ptosis CN V: facial sensation intact CN VII: upper and lower face symmetric CN VIII: hearing intact  to finger rub CN IX, X: gag intact, uvula midline CN XI: sternocleidomastoid and trapezius muscles intact CN XII: tongue midline Bulk & Tone: normal, no fasciculations. Motor: 5/5 throughout with no pronator drift. Sensation: intact to light touch, cold, pin, vibration and joint position sense.  No extinction to double simultaneous stimulation.  Romberg test negative Deep Tendon Reflexes: +2 throughout, no ankle clonus Plantar responses: downgoing bilaterally Cerebellar: no incoordination on finger to nose, heel to shin. No dysdiadochokinesia Gait: narrow-based and steady, able to tandem walk adequately. Tremor: none  IMPRESSION: This is a pleasant 31 year old right-handed woman with a history of depression, migraines, and seizures since childhood,  presenting to establish care. 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 MRI brain and routine EEG normal. Last witnessed seizure was 09/2017, however she had an unwitnessed episode of loss of consciousness 2 weeks ago in the setting of severe headache. We discussed starting a different anti-epileptic medication, Zonisamide, which can also help with headache prophylaxis. Side effects discussed, start Zonisamide 100mg  qhs x 2 weeks, then increase every 2 weeks to 300mg  qhs. She was advised to keep a calendar of her headaches and seizures. [place] driving laws were discussed with the patient, and she knows to stop driving after a seizure/episode of loss of consciousness, until 6 months event-free. She will follow-up in 3 months and knows to call for any changes.   Thank you for allowing me to participate in the care of this patient. Please do not hesitate to call for any questions or concerns.   Ellouise Newer, M.D.  CC: Dr. Criss Rosales

## 2018-10-13 ENCOUNTER — Ambulatory Visit: Payer: BLUE CROSS/BLUE SHIELD | Admitting: Neurology

## 2018-10-27 ENCOUNTER — Emergency Department (HOSPITAL_COMMUNITY): Payer: BLUE CROSS/BLUE SHIELD

## 2018-10-27 ENCOUNTER — Encounter (HOSPITAL_COMMUNITY): Payer: Self-pay | Admitting: Emergency Medicine

## 2018-10-27 ENCOUNTER — Emergency Department (HOSPITAL_COMMUNITY)
Admission: EM | Admit: 2018-10-27 | Discharge: 2018-10-27 | Disposition: A | Discharge status: Home / Self Care | Payer: BLUE CROSS/BLUE SHIELD | Attending: Emergency Medicine | Admitting: Emergency Medicine

## 2018-10-27 DIAGNOSIS — F17210 Nicotine dependence, cigarettes, uncomplicated: Secondary | ICD-10-CM | POA: Insufficient documentation

## 2018-10-27 DIAGNOSIS — Z79899 Other long term (current) drug therapy: Secondary | ICD-10-CM | POA: Diagnosis not present

## 2018-10-27 DIAGNOSIS — R002 Palpitations: Secondary | ICD-10-CM | POA: Diagnosis not present

## 2018-10-27 DIAGNOSIS — R079 Chest pain, unspecified: Secondary | ICD-10-CM | POA: Insufficient documentation

## 2018-10-27 DIAGNOSIS — R0789 Other chest pain: Secondary | ICD-10-CM | POA: Diagnosis not present

## 2018-10-27 DIAGNOSIS — R112 Nausea with vomiting, unspecified: Secondary | ICD-10-CM | POA: Diagnosis not present

## 2018-10-27 DIAGNOSIS — R000 Tachycardia, unspecified: Secondary | ICD-10-CM | POA: Diagnosis not present

## 2018-10-27 LAB — BASIC METABOLIC PANEL
Anion gap: 12 (ref 5–15)
BUN: 11 mg/dL (ref 6–20)
CO2: 19 mmol/L — ABNORMAL LOW (ref 22–32)
Calcium: 9.7 mg/dL (ref 8.9–10.3)
Chloride: 105 mmol/L (ref 98–111)
Creatinine, Ser: 0.76 mg/dL (ref 0.44–1.00)
GFR calc Af Amer: >60 mL/min (ref >60)
GFR calc non Af Amer: >60 mL/min (ref >60)
Glucose, Bld: 113 mg/dL — ABNORMAL HIGH (ref 70–99)
Potassium: 3.9 mmol/L (ref 3.5–5.1)
Sodium: 136 mmol/L (ref 135–145)

## 2018-10-27 LAB — CBC
HCT: 41.6 % (ref 36.0–46.0)
Hemoglobin: 13.8 g/dL (ref 12.0–15.0)
MCH: 31.3 pg (ref 26.0–34.0)
MCHC: 33.2 g/dL (ref 30.0–36.0)
MCV: 94.3 fL (ref 80.0–100.0)
Platelets: 444 10*3/uL — ABNORMAL HIGH (ref 150–400)
RBC: 4.41 MIL/uL (ref 3.87–5.11)
RDW: 13.1 % (ref 11.5–15.5)
WBC: 17.5 10*3/uL — ABNORMAL HIGH (ref 4.0–10.5)
nRBC: 0 % (ref 0.0–0.2)

## 2018-10-27 LAB — I-STAT BETA HCG BLOOD, ED (MC, WL, AP ONLY): I-stat hCG, quantitative: 5.2 m[IU]/mL — ABNORMAL HIGH (ref <5)

## 2018-10-27 LAB — I-STAT TROPONIN, ED: Troponin i, poc: 0 ng/mL (ref 0.00–0.08)

## 2018-10-27 MED ORDER — ONDANSETRON HCL 4 MG/2ML IJ SOLN
4.0000 mg | Freq: Once | INTRAMUSCULAR | Status: AC
  Administered 2018-10-27: 4 mg via INTRAVENOUS
  Filled 2018-10-27: qty 2
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
## 2018-10-27 NOTE — Discharge Instructions (Signed)
Read instructions below for reasons to return to the Emergency Department. It is recommended that your follow up with your Primary Care Doctor in regards to today's visit. If you do not have a doctor, use the resource guide listed below to help you find one. Begin taking over the counter Prilosec or Zegrid as directed.   Chest Pain (Nonspecific)  HOME CARE INSTRUCTIONS  For the next few days, avoid physical activities that bring on chest pain. Continue physical activities as directed.  Do not smoke cigarettes or drink alcohol until your symptoms are gone.  Only take over-the-counter or prescription medicine for pain, discomfort, or fever as directed by your caregiver.  Follow your caregiver's suggestions for further testing if your chest pain does not go away.  Keep any follow-up appointments you made. If you do not go to an appointment, you could develop lasting (chronic) problems with pain. If there is any problem keeping an appointment, you must call to reschedule.  SEEK MEDICAL CARE IF:  You think you are having problems from the medicine you are taking. Read your medicine instructions carefully.  Your chest pain does not go away, even after treatment.  You develop a rash with blisters on your chest.  SEEK IMMEDIATE MEDICAL CARE IF:  You have increased chest pain or pain that spreads to your arm, neck, jaw, back, or belly (abdomen).  You develop shortness of breath, an increasing cough, or you are coughing up blood.  You have severe back or abdominal pain, feel sick to your stomach (nauseous) or throw up (vomit).  You develop severe weakness, fainting, or chills.  You have an oral temperature above 102 F (38.9 C), not controlled by medicine.   THIS IS AN EMERGENCY. Do not wait to see if the pain will go away. Get medical help at once. Call your local emergency services (911 in [place] Do not drive yourself to the hospital.   RESOURCE GUIDE  Dental Problems  Patients with  Medicaid: [facility]                     [facility] [address]                                           [address] Phone:  [phone]                                                  Phone:  [phone]  If unable to pay or uninsured, contact:  [facility] or [facility] to become qualified for the adult dental clinic.  Chronic Pain Problems Contact [facility]  [phone] Patients need to be referred by their primary care doctor.  Insufficient Money for Medicine Contact [employer]:  call "211" or [facility] 271-5999.  No Primary Care Doctor Call [facility]  832-8000 Other agencies that provide inexpensive medical care    [facility]  832-8035    [facility]  832-7272    [facility]  [phone]    Women's Clinic  832-4777    [facility]  [phone]    [facility]  [phone]  Psychological Services   [facility]  [phone] [facility]  [phone] [facility]   800 853-5163 (emergency services 641-4993)  Substance Abuse Resources [facility]  336-882-2125 [facility] 336-784-9470 [facility] 336-285-9073 [facility] 336-845-3988 Residential & Outpatient Substance Abuse Program  800-659-3381  Abuse/Neglect [place] Child Abuse Hotline (336) 641-3795 [place] Child Abuse Hotline 800-378-5315 (After Hours)  Emergency Shelter [facility] (336) 271-5985  [facility] (336) 275-9566 [facility] (704) 372-4663  MRSA Hotline #:   832-7006    [place] Resources  [facility] [place]     [employer]                          [facility] [address] [place]                       [address]      [address]  [place]                                                 [place]                            [place] Phone:  [phone]                                   Phone:  [phone]                 Phone:  [phone]  [facility] Phone:  [phone]  [place] Child Abuse Hotline (336) 342-1394 (336) 342-3537 (After Hours)   

## 2018-11-22 ENCOUNTER — Other Ambulatory Visit (HOSPITAL_COMMUNITY)
Admission: RE | Admit: 2018-11-22 | Discharge: 2018-11-22 | Disposition: A | Discharge status: Home / Self Care | Payer: BLUE CROSS/BLUE SHIELD | Source: Ambulatory Visit | Attending: Family Medicine | Admitting: Family Medicine

## 2018-11-22 ENCOUNTER — Other Ambulatory Visit: Payer: Self-pay | Admitting: Family Medicine

## 2018-11-22 DIAGNOSIS — F064 Anxiety disorder due to known physiological condition: Secondary | ICD-10-CM | POA: Diagnosis not present

## 2018-11-22 DIAGNOSIS — N898 Other specified noninflammatory disorders of vagina: Secondary | ICD-10-CM | POA: Insufficient documentation

## 2018-11-22 DIAGNOSIS — F331 Major depressive disorder, recurrent, moderate: Secondary | ICD-10-CM | POA: Diagnosis not present

## 2018-11-22 DIAGNOSIS — G40019 Localization-related (focal) (partial) idiopathic epilepsy and epileptic syndromes with seizures of localized onset, intractable, without status epilepticus: Secondary | ICD-10-CM | POA: Diagnosis not present

## 2018-11-22 DIAGNOSIS — G5603 Carpal tunnel syndrome, bilateral upper limbs: Secondary | ICD-10-CM | POA: Diagnosis not present

## 2018-11-25 LAB — URINE CYTOLOGY ANCILLARY ONLY
CHLAMYDIA, DNA PROBE: NEGATIVE
Candida vaginitis: NEGATIVE
Neisseria Gonorrhea: NEGATIVE
TRICH (WINDOWPATH): NEGATIVE

## 2019-01-28 ENCOUNTER — Other Ambulatory Visit: Payer: Self-pay

## 2019-01-28 ENCOUNTER — Emergency Department (HOSPITAL_COMMUNITY): Payer: Self-pay

## 2019-01-28 ENCOUNTER — Encounter (HOSPITAL_COMMUNITY): Payer: Self-pay

## 2019-01-28 ENCOUNTER — Emergency Department (HOSPITAL_COMMUNITY)
Admission: EM | Admit: 2019-01-28 | Discharge: 2019-01-28 | Disposition: A | Discharge status: Home / Self Care | Payer: Self-pay | Attending: Emergency Medicine | Admitting: Emergency Medicine

## 2019-01-28 DIAGNOSIS — Z79899 Other long term (current) drug therapy: Secondary | ICD-10-CM | POA: Insufficient documentation

## 2019-01-28 DIAGNOSIS — M25511 Pain in right shoulder: Secondary | ICD-10-CM | POA: Insufficient documentation

## 2019-01-28 DIAGNOSIS — F17210 Nicotine dependence, cigarettes, uncomplicated: Secondary | ICD-10-CM | POA: Insufficient documentation

## 2019-01-28 LAB — BASIC METABOLIC PANEL
Anion gap: 7 (ref 5–15)
BUN: 11 mg/dL (ref 6–20)
CO2: 21 mmol/L — ABNORMAL LOW (ref 22–32)
Calcium: 8.9 mg/dL (ref 8.9–10.3)
Chloride: 109 mmol/L (ref 98–111)
Creatinine, Ser: 0.64 mg/dL (ref 0.44–1.00)
GFR calc Af Amer: >60 mL/min (ref >60)
Glucose, Bld: 98 mg/dL (ref 70–99)
Potassium: 4 mmol/L (ref 3.5–5.1)
Sodium: 137 mmol/L (ref 135–145)

## 2019-01-28 LAB — CBC
HCT: 41.9 % (ref 36.0–46.0)
Hemoglobin: 13.8 g/dL (ref 12.0–15.0)
MCH: 31.9 pg (ref 26.0–34.0)
MCHC: 32.9 g/dL (ref 30.0–36.0)
MCV: 97 fL (ref 80.0–100.0)
Platelets: 421 10*3/uL — ABNORMAL HIGH (ref 150–400)
RBC: 4.32 MIL/uL (ref 3.87–5.11)
RDW: 13 % (ref 11.5–15.5)
WBC: 10.7 10*3/uL — ABNORMAL HIGH (ref 4.0–10.5)
nRBC: 0 % (ref 0.0–0.2)

## 2019-01-28 LAB — I-STAT TROPONIN, ED: TROPONIN I, POC: 0 ng/mL (ref 0.00–0.08)

## 2019-01-28 LAB — I-STAT BETA HCG BLOOD, ED (MC, WL, AP ONLY): I-stat hCG, quantitative: <5 m[IU]/mL (ref <5)

## 2019-01-28 MED ORDER — NAPROXEN 500 MG PO TABS
500.0000 mg | ORAL_TABLET | Freq: Once | ORAL | Status: AC
  Administered 2019-01-28: 500 mg via ORAL
  Filled 2019-01-28: qty 1

## 2019-01-28 MED ORDER — NAPROXEN 500 MG PO TABS: 500.0000 mg | ORAL_TABLET | Freq: Two times a day (BID) | ORAL | 0 refills | Status: DC

## 2019-01-28 MED ORDER — SODIUM CHLORIDE 0.9% FLUSH: 3.0000 mL | Freq: Once | INTRAVENOUS | Status: DC

## 2019-01-28 NOTE — ED Provider Notes (Signed)
[facility] DEPT Provider Note   CSN: [id number] Arrival date & time: 01/28/19  1317     History   Chief Complaint Chief Complaint  Patient presents with  . Shoulder Pain  . Chest Pain    HPI Virginia Bradley is a 33 y.o. female with history of depression, migraines, seizures is here for evaluation of right shoulder pain.  Described as sharp, constant and mild but significantly worse with any movement of the joint.  The pain is localized to the anterior aspect of the shoulder.  The pain began this morning around 3 AM while she was still laying in bed and was trying to roll over onto her right side.  She took Aleve and then Tylenol without improvement.  She went to work but was unable to do her tasks and came to the ER.  She works as a Chemical engineer.  She is right-hand dominant.  There is some achiness that radiates down to the right wrist.  Associated with subjective swelling of the right thumb and right dorsal hand.  The pain is worse with shoulder abduction, flexion past head level.  During increase of shoulder pain she feels her heart fluttering, this is intermittent.  There is no associated chest pain, neck pain, headache, shortness of breath, nausea, vomiting, diaphoresis.  Denies any falls or direct trauma.  Denies any recent heavy/exertional activity.  Denies loss of sensation, paresthesias or weakness to the extremity. HPI  Past Medical History:  Diagnosis Date  . Headache   . Seizures [facility])     Patient Active Problem List   Diagnosis Date Noted  . Localization-related idiopathic epilepsy and epileptic syndromes with seizures of localized onset, not intractable, without status epilepticus ([place]) 07/20/2018  . Seizures ([place]) 03/24/2016  . Migraine 03/24/2016    Past Surgical History:  Procedure Laterality Date  . APPENDECTOMY    . ovary removed       OB History   No obstetric history on file.      Home Medications    Prior to  Admission medications   Medication Sig Start Date End Date Taking? Authorizing Provider  acetaminophen (TYLENOL) 500 MG tablet Take 1,000 mg by mouth daily as needed for mild pain.   Yes [provider]  mirtazapine (REMERON) 15 MG tablet Take 15 mg by mouth at bedtime.  08/22/17  Yes [provider]  zonisamide (ZONEGRAN) 100 MG capsule Take 1 cap every night for 2 weeks, then increase to 2 caps every night for 2 weeks, then increase to 3 caps every night and continue 07/20/18  Yes Cameron Sprang, MD  ibuprofen (ADVIL,MOTRIN) 600 MG tablet Take 1 tablet (600 mg total) by mouth every 6 (six) hours as needed. Patient not taking: Reported on 09/22/2017 03/22/17   Forde Dandy, MD  LORazepam (ATIVAN) 1 MG tablet Take 1 mg by mouth at bedtime. 11/29/18   [provider]  naproxen (NAPROSYN) 500 MG tablet Take 1 tablet (500 mg total) by mouth 2 (two) times daily. 01/28/19   Kinnie Feil, PA-C    Family History Family History  Problem Relation Age of Onset  . Seizures Mother   . Hypertension Maternal Grandmother     Social History Social History   Tobacco Use  . Smoking status: Current Some Day Smoker    Packs/day: 0.50    Types: Cigarettes  . Smokeless tobacco: Never Used  Substance Use Topics  . Alcohol use: No    Alcohol/week: 0.0  standard drinks  . Drug use: Yes    Types: Marijuana    Comment: Smokes marijuana three times monthly.     Allergies   Depakote er [divalproex sodium er]   Review of Systems Review of Systems  Cardiovascular: Positive for palpitations.  Musculoskeletal: Positive for arthralgias.  All other systems reviewed and are negative.    Physical Exam Updated Vital Signs BP 124/80   Pulse 90   Temp 99.3 F (37.4 C) (Oral)   Resp 19   Wt 88.5 kg   LMP 01/04/2019   SpO2 100%   BMI 30.54 kg/m   Physical Exam Constitutional:      Appearance: She is well-developed.     Comments: NAD. Non toxic.   HENT:     Head:  Normocephalic and atraumatic.     Nose: Nose normal.  Eyes:     General: Lids are normal.     Conjunctiva/sclera: Conjunctivae normal.  Neck:     Musculoskeletal: Normal range of motion.     Trachea: Trachea normal.     Comments: C-spine: No midline tenderness.  No paraspinal muscle tenderness.  No rigidity, full range of motion of the neck without pain.  Trachea is midline Cardiovascular:     Rate and Rhythm: Normal rate and regular rhythm.     Pulses:          Radial pulses are 2+ on the right side and 2+ on the left side.       Dorsalis pedis pulses are 2+ on the right side and 2+ on the left side.     Heart sounds: Normal heart sounds, S1 normal and S2 normal.     Comments: No LE edema or calf tenderness. No chest wall tenderness. No pain with deep inspiration.  Pulmonary:     Effort: Pulmonary effort is normal.     Breath sounds: Normal breath sounds.  Abdominal:     General: Bowel sounds are normal.     Palpations: Abdomen is soft.     Tenderness: There is no abdominal tenderness.     Comments: No epigastric tenderness. No distention.   Musculoskeletal:     Right shoulder: She exhibits tenderness.     Comments:  Right shoulder: moderate anterior/lateral tenderness. Focal TTP to biceps groove. +Speed's and Yergason's test.  Positive Hawkin's and Neer's.  Decreased flexion and abduction (approx 45 degrees) due to pain. No obvious skin abnormalities including abrasions, ecchymosis, erythema, edema No point tenderness to sternum, anterior chest wall, scapula, clavicle, AC or Armstrong joints No tenderness to trapezius  Skin:    General: Skin is warm and dry.     Capillary Refill: Capillary refill takes less than 2 seconds.     Comments: No rash to chest wall. No skin abnormalities to right shoulder or RUE. Obvious edema, erythema, to right shoulder or RUE.   Neurological:     Mental Status: She is alert.     GCS: GCS eye subscore is 4. GCS verbal subscore is 5. GCS motor subscore is 6.      Comments: Sensation to light touch intact in median, ulnar, radial nerve distribution bilaterally. 5/5 strength with hand grip bilaterally.   Psychiatric:        Speech: Speech normal.        Behavior: Behavior normal.        Thought Content: Thought content normal.      ED Treatments / Results  Labs (all labs ordered are listed, but only abnormal results  are displayed) Labs Reviewed  BASIC METABOLIC PANEL - Abnormal; Notable for the following components:      Result Value   CO2 21 (*)    All other components within normal limits  CBC - Abnormal; Notable for the following components:   WBC 10.7 (*)    Platelets 421 (*)    All other components within normal limits  I-STAT TROPONIN, ED  I-STAT BETA HCG BLOOD, ED ([facility], [facility], [facility] ONLY)    EKG EKG Interpretation  Date/Time:  Friday January 28 2019 15:32:03 EST Ventricular Rate:  80 PR Interval:    QRS Duration: 84 QT Interval:  361 QTC Calculation: 417 R Axis:   94 Text Interpretation:  Sinus rhythm Borderline right axis deviation No STEMI.  Confirmed by Nanda Quinton [phone]) on 01/28/2019 3:35:09 PM   Radiology Dg Chest 2 View  Result Date: 01/28/2019 CLINICAL DATA:  Right arm pain. EXAM: CHEST - 2 VIEW COMPARISON:  10/27/2018.  03/22/2017. FINDINGS: Mediastinum hilar structures normal. Heart size normal. Diffuse mild bilateral interstitial prominence noted. Similar findings noted on prior exams. These changes may be chronic. Mild pneumonitis can not be excluded. No pleural effusion or pneumothorax IMPRESSION: Diffuse mild bilateral interstitial prominence. Similar findings noted on prior exams. These changes may be related chronic interstitial lung disease. Mild pneumonitis can not be excluded. Electronically Signed   By: Marcello Moores  Register   On: 01/28/2019 14:27    Procedures Procedures (including critical care time)  Medications Ordered in ED Medications  sodium chloride flush (NS) 0.9 % injection 3 mL (has no  administration in time range)  naproxen (NAPROSYN) tablet 500 mg (500 mg Oral Given 01/28/19 1603)     Initial Impression / Assessment and Plan / ED Course  I have reviewed the triage vital signs and the nursing notes.  Pertinent labs & imaging results that were available during my care of the patient were reviewed by me and considered in my medical decision making (see chart for details).    32 year old is here with right shoulder pain and palpitations.    Based on history and exam I have higher suspicion for MSK etiology of the right shoulder.  There is no overlying signs of infection, septic arthritis.  No associated head/neck pain or paresthesias esthesia/radicular symptoms to the affected joint.  She reports objective hand swelling but this is not obvious to me on exam.  Extremity is neurovascularly intact.  No preceding trauma.  Exam is more suggestive of biceps tendinitis versus soft tissue injury/repetitive use related pain.  I doubt gout.  We will obtain x-ray.  Anticipate discharge with sling, naproxen, rice, early range of motion exercises and follow-up with PCP/Ortho for persistent symptoms.  I have discussed this with patient who is comfortable with the plan. Return precautions were discussed.  In regards to palpitations, she is denying associated chest pain with this, shortness of breath, lightheadedness.  It seems that the acute pain caused her palpitations/flutters.  Triage ordered EKG, troponin, electrolytes which were reviewed and unremarkable.  No ST elevation, T wave inversion or other signs of ischemia on her EKG.  Undetectable troponin.  Chest x-ray without infiltrate or edema.  Based on symptomatology, heart score less than 3, I have very low suspicion for ACS in this patient.  Her pain has been constant since 4 AM today and acutely worsens with movement of the joint, I do not think a repeat delta troponin is indicated. Considered upper extremity DVT less likely.  She has no risk  factors for this such as history of DVT/PE, recent prolonged travel, recent surgery or procedures, asymmetric edema.  Again, she is not having any chest pain, shortness of breath.  HR is 102 but she is anxious.  No tachypnea or hypoxia.     1635: Pending XR, delay in radiology. Hand off to oncoming EDPA who will f/u on x-ray and discharge.   Final Clinical Impressions(s) / ED Diagnoses   Final diagnoses:  Acute pain of right shoulder    ED Discharge Orders         Ordered    naproxen (NAPROSYN) 500 MG tablet  2 times daily     01/28/19 1641           Arlean Hopping 01/28/19 1641    Mesner, Corene Cornea, MD 02/06/19 2038

## 2019-01-28 NOTE — ED Triage Notes (Signed)
Pt states she started having right shoulder pain, radiating up to her chest since 0300. Pt states her fingers are swollen as well. Pt describes her heart as fluttering.  Pt states she cannot move her right arm.

## 2019-01-28 NOTE — Discharge Instructions (Signed)
You were seen in the ER for right shoulder pain and palpitations.  X-ray of your shoulder and chest did not show any bony injuries, pneumonia, fluid around your lungs.  Your labs are normal.  I suspect your right shoulder pain is from a muscular or soft tissue injury.  Wear your sling for the next 48 hours.  Ice.  Take 500 mg of naproxen every 8 hours for pain, you may add 500 to 1000 mg of acetaminophen every 8 hours for more significant pain.  After 48 hours start doing light range of motion exercises and stretches to avoid stiffness and weakness.  Return to the ER for loss of sensation or strength in your extremity, neck pain, swelling, redness, warmth, chest pain or shortness of breath with exertion.

## 2019-01-28 NOTE — ED Provider Notes (Signed)
Received patient at signout from [facility].  Refer to provider note for full history and physical examination.  Briefly, patient is a 33 year old female presenting for evaluation of right shoulder pain.  She is neurovascularly intact.  Physical examination reassuring.  Awaiting shoulder x-rays.  Likely biceps tendinitis.  If radiographs are reassuring, patient stable for discharge home with follow-up with PCP or orthopedist.  Conservative therapy indicated and discussed by outgoing ED PA.   5:00PM Radiographs show no acute osseous abnormality.  Patient stable for discharge home with follow-up with PCP orthopedist on outpatient basis. Pt verbalized understanding of and agreement with plan and is safe for discharge home at this time.    Renita Papa, PA-C 01/29/19 0015    Davonna Belling, MD 01/31/19 [phone]

## 2019-03-15 ENCOUNTER — Encounter (HOSPITAL_COMMUNITY): Payer: Self-pay

## 2019-03-15 ENCOUNTER — Emergency Department (HOSPITAL_COMMUNITY)
Admission: EM | Admit: 2019-03-15 | Discharge: 2019-03-15 | Disposition: A | Discharge status: Home / Self Care | Payer: Self-pay | Attending: Emergency Medicine | Admitting: Emergency Medicine

## 2019-03-15 ENCOUNTER — Other Ambulatory Visit: Payer: Self-pay

## 2019-03-15 DIAGNOSIS — Z9114 Patient's other noncompliance with medication regimen: Secondary | ICD-10-CM | POA: Insufficient documentation

## 2019-03-15 DIAGNOSIS — Z79899 Other long term (current) drug therapy: Secondary | ICD-10-CM | POA: Insufficient documentation

## 2019-03-15 DIAGNOSIS — F17210 Nicotine dependence, cigarettes, uncomplicated: Secondary | ICD-10-CM | POA: Insufficient documentation

## 2019-03-15 DIAGNOSIS — R569 Unspecified convulsions: Secondary | ICD-10-CM | POA: Insufficient documentation

## 2019-03-15 DIAGNOSIS — F1210 Cannabis abuse, uncomplicated: Secondary | ICD-10-CM | POA: Insufficient documentation

## 2019-03-15 MED ORDER — ZONISAMIDE 100 MG PO CAPS: ORAL_CAPSULE | ORAL | 6 refills | Status: DC

## 2019-03-15 MED ORDER — LORAZEPAM 1 MG PO TABS
1.0000 mg | ORAL_TABLET | Freq: Once | ORAL | Status: AC
  Administered 2019-03-15: 1 mg via ORAL
  Filled 2019-03-15: qty 1

## 2019-03-15 NOTE — ED Notes (Signed)
Pt requesting DC. MD made aware by [facility] RN

## 2019-03-15 NOTE — ED Notes (Signed)
Bed: LK91 Expected date:  Expected time:  Means of arrival:  Comments: EMS seizure

## 2019-03-15 NOTE — ED Triage Notes (Signed)
Pt arrives via [facility] from work. EMS reports that the pt has been on zonisamide but continued to have seizures while on medication. Pt switched to [place] one month ago but pt has been unable to get medication filled due to insurance. Pt reports that she also had 2 seizures yesterday as well.

## 2019-03-15 NOTE — ED Provider Notes (Signed)
[facility] DEPT Provider Note   CSN: [id number] Arrival date & time: 03/15/19  1151    History   Chief Complaint Chief Complaint  Patient presents with  . Seizures    HPI Virginia Bradley is a 33 y.o. female.     HPI Patient is a 33 year old female with a history of seizure disorder for which she is supposed to be on zonisamide.  She was previously on Keppra and was switched to zonisamide in July 2019.  She has had a medication and been doing well up until just over a month ago at which point she lost her insurance and ran out of her medication.  She had 2 seizures yesterday.  She had a seizure while at work today.  Her blood sugar was normal.  She was brought to the ER for evaluation.  She has no complaints at this time.  She has no longer postictal.  She feels much better.  She is requesting a refill of her zonisamide while her    Past Medical History:  Diagnosis Date  . Headache   . Seizures [facility])     Patient Active Problem List   Diagnosis Date Noted  . Localization-related idiopathic epilepsy and epileptic syndromes with seizures of localized onset, not intractable, without status epilepticus ([place]) 07/20/2018  . Seizures ([place]) 03/24/2016  . Migraine 03/24/2016    Past Surgical History:  Procedure Laterality Date  . APPENDECTOMY    . ovary removed       OB History   No obstetric history on file.      Home Medications    Prior to Admission medications   Medication Sig Start Date End Date Taking? Authorizing Provider  acetaminophen (TYLENOL) 500 MG tablet Take 1,000 mg by mouth daily as needed for mild pain.    [provider]  ibuprofen (ADVIL,MOTRIN) 600 MG tablet Take 1 tablet (600 mg total) by mouth every 6 (six) hours as needed. Patient not taking: Reported on 09/22/2017 03/22/17   Forde Dandy, MD  LORazepam (ATIVAN) 1 MG tablet Take 1 mg by mouth at bedtime. 11/29/18   [provider]  mirtazapine (REMERON)  15 MG tablet Take 15 mg by mouth at bedtime.  08/22/17   [provider]  naproxen (NAPROSYN) 500 MG tablet Take 1 tablet (500 mg total) by mouth 2 (two) times daily. 01/28/19   Kinnie Feil, PA-C  zonisamide (ZONEGRAN) 100 MG capsule Take 1 cap every night for 2 weeks, then increase to 2 caps every night for 2 weeks, then increase to 3 caps every night and continue 03/15/19   Jola Schmidt, MD    Family History Family History  Problem Relation Age of Onset  . Seizures Mother   . Hypertension Maternal Grandmother     Social History Social History   Tobacco Use  . Smoking status: Current Some Day Smoker    Packs/day: 0.50    Types: Cigarettes  . Smokeless tobacco: Never Used  Substance Use Topics  . Alcohol use: No    Alcohol/week: 0.0 standard drinks  . Drug use: Yes    Types: Marijuana    Comment: Smokes marijuana three times monthly.     Allergies   Depakote er [divalproex sodium er]   Review of Systems Review of Systems  All other systems reviewed and are negative.    Physical Exam Updated Vital Signs BP 112/83 (BP Location: Left Arm)   Pulse 80   Temp 98.8 F (37.1 C) (  Oral)   Resp 18   Ht 5\' 7"  (1.702 m)   Wt 83.9 kg   SpO2 99%   BMI 28.98 kg/m   Physical Exam Vitals signs and nursing note reviewed.  Constitutional:      General: She is not in acute distress.    Appearance: She is well-developed.  HENT:     Head: Normocephalic and atraumatic.  Eyes:     Pupils: Pupils are equal, round, and reactive to light.  Neck:     Musculoskeletal: Normal range of motion.  Cardiovascular:     Rate and Rhythm: Normal rate and regular rhythm.     Heart sounds: Normal heart sounds.  Pulmonary:     Effort: Pulmonary effort is normal.     Breath sounds: Normal breath sounds.  Abdominal:     General: There is no distension.     Palpations: Abdomen is soft.     Tenderness: There is no abdominal tenderness.  Musculoskeletal: Normal range of motion.   Skin:    General: Skin is warm and dry.  Neurological:     Mental Status: She is alert and oriented to person, place, and time.     Comments: 5/5 strength in major muscle groups of  bilateral upper and lower extremities. Speech normal. No facial asymetry.   Psychiatric:        Judgment: Judgment normal.      ED Treatments / Results  Labs (all labs ordered are listed, but only abnormal results are displayed) Labs Reviewed - No data to display  EKG None  Radiology No results found.  Procedures Procedures (including critical care time)  Medications Ordered in ED Medications  LORazepam (ATIVAN) tablet 1 mg (1 mg Oral Given 03/15/19 1254)     Initial Impression / Assessment and Plan / ED Course  I have reviewed the triage vital signs and the nursing notes.  Pertinent labs & imaging results that were available during my care of the patient were reviewed by me and considered in my medical decision making (see chart for details).        Patient overall well-appearing here in the emergency department.  Single dose of benzodiazepine given so as to change seizure threshold here in the ER.  Seizure likely secondary to medication noncompliance.  Patient prescribed her zonisamide.  No indication for advanced imaging.  Primary care follow-up and neurology follow-up.  Patient understands return the emergency department for new or worsening symptoms  Final Clinical Impressions(s) / ED Diagnoses   Final diagnoses:  Seizure ([place])  Noncompliance with medications    ED Discharge Orders         Ordered    zonisamide (ZONEGRAN) 100 MG capsule     03/15/19 1247           Jola Schmidt, MD 03/15/19 1527

## 2019-03-16 ENCOUNTER — Telehealth: Payer: Self-pay | Admitting: Neurology

## 2019-03-16 NOTE — Telephone Encounter (Signed)
Patient has had 4 seizures in two days and is needing to figure out medications. She was just in the ER. Please call her back at [phone]. Thanks!

## 2019-03-16 NOTE — Telephone Encounter (Signed)
Spoke with pt.  She states that she ran out of her Zonisamide on March 18th and experienced 2 seizures on March 22 and then 2 more seizures on March 24.  Pt was at work during seizure activity on March 24 and was taken to the ED.  Advised pt to re-start Zonisamide 100mg .  Advised that Dr. Venora Maples from the ED sent Rx #90 with 3 refills to [facility] on [address].

## 2019-05-19 DIAGNOSIS — F3341 Major depressive disorder, recurrent, in partial remission: Secondary | ICD-10-CM | POA: Insufficient documentation

## 2019-06-01 ENCOUNTER — Telehealth: Payer: Self-pay | Admitting: Neurology

## 2019-06-01 ENCOUNTER — Other Ambulatory Visit: Payer: Self-pay

## 2019-06-01 ENCOUNTER — Telehealth (INDEPENDENT_AMBULATORY_CARE_PROVIDER_SITE_OTHER): Payer: Self-pay | Admitting: Neurology

## 2019-06-01 DIAGNOSIS — G43009 Migraine without aura, not intractable, without status migrainosus: Secondary | ICD-10-CM

## 2019-06-01 DIAGNOSIS — G40009 Localization-related (focal) (partial) idiopathic epilepsy and epileptic syndromes with seizures of localized onset, not intractable, without status epilepticus: Secondary | ICD-10-CM

## 2019-06-01 MED ORDER — ZONISAMIDE 100 MG PO CAPS: ORAL_CAPSULE | ORAL | 11 refills | Status: DC

## 2019-06-01 NOTE — Progress Notes (Signed)
Virtual Visit via Video Note The purpose of this virtual visit is to provide medical care while limiting exposure to the novel coronavirus.    Consent was obtained for video visit:  Yes.   Answered questions that patient had about telehealth interaction:  Yes.   I discussed the limitations, risks, security and privacy concerns of performing an evaluation and management service by telemedicine. I also discussed with the patient that there may be a patient responsible charge related to this service. The patient expressed understanding and agreed to proceed.  Pt location: Home Physician Location: office Name of referring provider:  Lucianne Lei, MD I connected with Lorenso Courier at patients initiation/request on 06/01/2019 at 10:30 AM EDT by video enabled telemedicine application and verified that I am speaking with the correct person using two identifiers. 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 She was started on Zonisamide in July 2019 and did well for many months until she ran out of Zonisamide due to loss of insurance in February and was in the ER for a seizure at work on 03/15/2019. Zonisamide was refilled and she states she has been taking Zonisamide 300mg  qhs without any missed doses but had an unwitnessed seizure on Sunday where she woke up on the floor with urinary incontinence, and another one yesterday. She was getting ready for work, then the next thing she knew her sister was in front of her telling her she found her on the floor. She usually has a bad headache prior to her seizures, and recalls having one on Sunday and yesterday, she still has a mild headache today. She slept all day yesterday. She is still not feeling back to her usual self, she feels very weak and her legs hurt really bad. She usually has  left-sided weakness after the seizures and feels her left arm is still weaker when she tries to hold something. She denies any clear triggers, no recent infections, sleep deprivation, or alcohol use. She denies any side effects on Zonisamide. No dizziness, vision changes.   History on Initial Assessment 07/20/2018: This is a pleasant 33 year old right-handed woman with a history of depression, migraines, and seizures. She was born 1 month premature and reports having febrile convulsions as an infant. Seizures quieted down until 2001 in high school, when she started having generalized convulsions. Seizures since then would be preceded by bad headaches for several days, then she would have a funny taste like dirty pennies in her mouth, sometimes see spots in her vision or have occasional pins sensation in her hands and feet, then pass out with report of convulsive activity. She would have left-sided weakness after a seizure. She has had urinary incontinence with some of them. She recalls taking Dilantin, then Tegretol and Trileptal in high school, which reduced the seizures to around once a month. After she graduated from high school, she was having seizures around twice a year. She was seen by neurologist Dr. Krista Blue in April 2017 reporting side effects of drowsiness on Keppra. At that time she reported seizures every 2 years, she had a seizure in 2013, 2015, then 01/2016 found confused on the floor. 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 She had an MRI  brain with and without contrast which I personally reviewed, no acute changes, hippocampi symmetric with no abnormal signal or enhancement. Her routine EEG was normal. She was started on Topamax for seizures and headaches, she does not recall why she stopped it but did not finish the prescription. She was in the ER in October 2018 after having 2 seizures at work and  was restarted on Keppra. She saw another neurologist in [facility] and expressed concern for side effects, she ran out of refills in April 2019 and has been off medication with no events for 9 months until 2 weeks ago when she was woken up at 3am with a bad headache. She recalls getting off the bed to get Excedrin, then woke up on the floor 3 hours later with her Excedrin still on the shelf. She did not feel the typical penny taste or how she feels after a seizure, but she did not feel good. Headache had improved. No tongue bite or incontinence.   She notices that she stares off but is able to respond. She lives alone but denies being told at work of any other episodes of unresponsiveness. She denies any rising epigastric sensation, myoclonic jerks. She has headaches around twice a week lasting 2-3 days. She describes an ache over the right temporal region, with occasional sensitivity to lights and sound. No nausea/vomiting. She has occasional dizziness when bending down. She has also noticed pulsatile tinnitus ("like a washing machine") when she bends down or when laying on the bed at night, more on her left ear. She has constant back pain. No diplopia, vision loss, dysarthria/dysphagia, neck pain, bowel/bladder dysfunction. Memory is good. She became tearful in the office relating her experience with prior neurologist, as well as with trauma from her mother and brother's deaths in [place].   Epilepsy Risk Factors:  Her mother had seizures. Otherwise she had a normal birth and early development.  There is no history of febrile convulsions, CNS infections such as meningitis/encephalitis, significant traumatic brain injury, neurosurgical procedures.  Prior AEDs: Dilantin, Tegretol, Trileptal, Depakote (itchy, lips swelled), Topamax (does not recall side effects but ddi not finish rx), Keppra, Zonisamide  Diagnostic Data: I personally reviewed MRI brain without contrast done 09/2017 which was normal. MRI  brain with and without contrast done 03/2016 no acute changes, hippocampi symmetric with no abnormal signal or enhancement seen. EEGs done at [facility] in 04/2016 was a normal wake EEG     Current Outpatient Medications on File Prior to Visit  Medication Sig Dispense Refill   acetaminophen (TYLENOL) 500 MG tablet Take 1,000 mg by mouth daily as needed for mild pain.     ibuprofen (ADVIL,MOTRIN) 600 MG tablet Take 1 tablet (600 mg total) by mouth every 6 (six) hours as needed. 30 tablet 0   LORazepam (ATIVAN) 1 MG tablet Take 1 mg by mouth at bedtime.     mirtazapine (REMERON) 15 MG tablet Take 15 mg by mouth at bedtime.   2   zonisamide (ZONEGRAN) 100 MG capsule Take 1 cap every night for 2 weeks, then increase to 2 caps every night for 2 weeks, then increase to 3 caps every night and continue 90 capsule 6   No current facility-administered medications on file prior to visit.      Observations/Objective:   GEN:  The patient appears stated age and is in NAD.  Neurological examination: Patient is awake, alert, oriented x 3. No aphasia or dysarthria. Intact fluency and comprehension. Remote and recent  memory intact. Able to name and repeat. Cranial nerves: Extraocular movements intact with no nystagmus. Initially when patient smiled, it appeared that left side of face was weaker, but this was not consistent and later on there was no facial asymmetry. Motor: moves all extremities symmetrically, at least anti-gravity x 4. She reports decreased fine finger movements on the left hand. No incoordination on finger to nose testing. Gait: narrow-based and steady  Assessment and Plan:   This is a pleasant 33 yo RH woman with a history of depression, migraines, and seizures since childhood. 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 MRI brain and routine EEG normal. She was doing well for  several onths on Zonisamide until she had a seizure in 02/2019 due to missing medication, then 2 seizures this week with no clear triggers. She reports taking Zonisamide regularly. We discussed increasing Zonisamide dose to 400mg  qhs. She may take Tylenol/Ibuprofen for muscle pain and was advised to increase fluid intake. [place] driving laws were again discussed with the patient, and she knows to stop driving after a seizure/episode of loss of consciousness, until 6 months event-free. She will follow-up in 3 months and knows to call for any changes.   Follow Up Instructions:   -I discussed the assessment and treatment plan with the patient. The patient was provided an opportunity to ask questions and all were answered. The patient agreed with the plan and demonstrated an understanding of the instructions.   The patient was advised to call back or seek an in-person evaluation if the symptoms worsen or if the condition fails to improve as anticipated.    Total Time spent in visit with the patient was:  15 minutes, of which more than 50% of the time was spent in counseling and/or coordinating care on the above.   Pt understands and agrees with the plan of care outlined.     Cameron Sprang, MD

## 2019-06-01 NOTE — Telephone Encounter (Signed)
2 seizures- 1 Monday and 1 yesterday; patient is trying to find out what to do. Not sure if she needs an appt with you or to go to ED. Please call her back at [phone]. Thanks!

## 2019-06-01 NOTE — Telephone Encounter (Signed)
Scheduled for today.

## 2019-08-03 ENCOUNTER — Other Ambulatory Visit: Payer: Self-pay

## 2019-08-03 DIAGNOSIS — Z20822 Contact with and (suspected) exposure to covid-19: Secondary | ICD-10-CM

## 2019-08-04 LAB — NOVEL CORONAVIRUS, NAA: SARS-CoV-2, NAA: NOT DETECTED

## 2019-09-14 ENCOUNTER — Ambulatory Visit: Payer: Self-pay | Admitting: Neurology

## 2019-12-09 ENCOUNTER — Other Ambulatory Visit: Payer: Self-pay

## 2019-12-09 DIAGNOSIS — Z20822 Contact with and (suspected) exposure to covid-19: Secondary | ICD-10-CM

## 2019-12-11 LAB — NOVEL CORONAVIRUS, NAA: SARS-CoV-2, NAA: NOT DETECTED

## 2019-12-13 ENCOUNTER — Other Ambulatory Visit: Payer: BLUE CROSS/BLUE SHIELD

## 2020-07-06 ENCOUNTER — Inpatient Hospital Stay (HOSPITAL_COMMUNITY): Admit: 2020-07-06 | Payer: BLUE CROSS/BLUE SHIELD

## 2020-07-07 ENCOUNTER — Other Ambulatory Visit: Payer: Self-pay | Admitting: Family Medicine

## 2020-07-09 LAB — MOLECULAR ANCILLARY ONLY
Bacterial Vaginitis (gardnerella): POSITIVE — AB
Candida Glabrata: NEGATIVE
Candida Vaginitis: NEGATIVE
Chlamydia: NEGATIVE
Comment: NEGATIVE
Comment: NEGATIVE
Comment: NEGATIVE
Comment: NEGATIVE
Comment: NEGATIVE
Comment: NORMAL
Neisseria Gonorrhea: NEGATIVE
Trichomonas: NEGATIVE

## 2020-10-03 ENCOUNTER — Encounter (HOSPITAL_COMMUNITY): Payer: Self-pay | Admitting: Emergency Medicine

## 2020-10-03 ENCOUNTER — Ambulatory Visit (HOSPITAL_COMMUNITY)
Admission: EM | Admit: 2020-10-03 | Discharge: 2020-10-03 | Disposition: A | Discharge status: Home / Self Care | Payer: 59 | Attending: Family Medicine | Admitting: Family Medicine

## 2020-10-03 ENCOUNTER — Ambulatory Visit (INDEPENDENT_AMBULATORY_CARE_PROVIDER_SITE_OTHER): Payer: 59

## 2020-10-03 ENCOUNTER — Other Ambulatory Visit: Payer: Self-pay

## 2020-10-03 DIAGNOSIS — J069 Acute upper respiratory infection, unspecified: Secondary | ICD-10-CM | POA: Diagnosis not present

## 2020-10-03 DIAGNOSIS — Z9049 Acquired absence of other specified parts of digestive tract: Secondary | ICD-10-CM | POA: Insufficient documentation

## 2020-10-03 DIAGNOSIS — R059 Cough, unspecified: Secondary | ICD-10-CM | POA: Diagnosis present

## 2020-10-03 DIAGNOSIS — Z20822 Contact with and (suspected) exposure to covid-19: Secondary | ICD-10-CM | POA: Diagnosis not present

## 2020-10-03 DIAGNOSIS — R079 Chest pain, unspecified: Secondary | ICD-10-CM

## 2020-10-03 DIAGNOSIS — F17210 Nicotine dependence, cigarettes, uncomplicated: Secondary | ICD-10-CM | POA: Insufficient documentation

## 2020-10-03 DIAGNOSIS — R509 Fever, unspecified: Secondary | ICD-10-CM

## 2020-10-03 DIAGNOSIS — R0981 Nasal congestion: Secondary | ICD-10-CM | POA: Diagnosis not present

## 2020-10-03 DIAGNOSIS — Z90721 Acquired absence of ovaries, unilateral: Secondary | ICD-10-CM | POA: Diagnosis not present

## 2020-10-03 DIAGNOSIS — F3341 Major depressive disorder, recurrent, in partial remission: Secondary | ICD-10-CM | POA: Insufficient documentation

## 2020-10-03 DIAGNOSIS — Z79899 Other long term (current) drug therapy: Secondary | ICD-10-CM | POA: Diagnosis not present

## 2020-10-03 LAB — SARS CORONAVIRUS 2 (TAT 6-24 HRS): SARS Coronavirus 2: NEGATIVE

## 2020-10-03 NOTE — Discharge Instructions (Addendum)
Your x ray did not show any pneumonia This is most likely viral You can continue the OTC medicines as needed.  Recommend mucinex OTC, tylenol and ibuprofen as needed.  You can check your my chart for covid results.

## 2020-10-03 NOTE — ED Triage Notes (Signed)
Pt presents with cough, chest pain, nasal congestion, and fever xs 4 days.

## 2020-10-04 NOTE — ED Provider Notes (Signed)
[place]    CSN: [id number] Arrival date & time: 10/03/20  0802      History   Chief Complaint Chief Complaint  Patient presents with  . Cough  . Chest Pain  . Nasal Congestion    HPI Virginia Bradley is a 34 y.o. female.   Patient is 34 year old female that presents today with cough, chest pain and fever x1 days.  Symptoms have been constant.  She is using over-the-counter medications without much relief.  Feels like her symptoms are worsening.     Past Medical History:  Diagnosis Date  . Headache   . Seizures [facility])     Patient Active Problem List   Diagnosis Date Noted  . Recurrent major depressive disorder, in partial remission ([place]) 05/19/2019  . Localization-related idiopathic epilepsy and epileptic syndromes with seizures of localized onset, not intractable, without status epilepticus ([place]) 07/20/2018  . Seizure disorder ([place]) 03/24/2016  . Migraine 03/24/2016    Past Surgical History:  Procedure Laterality Date  . APPENDECTOMY    . ovary removed      OB History   No obstetric history on file.      Home Medications    Prior to Admission medications   Medication Sig Start Date End Date Taking? Authorizing Provider  acetaminophen (TYLENOL) 500 MG tablet Take 1,000 mg by mouth daily as needed for mild pain.    [provider]  ibuprofen (ADVIL,MOTRIN) 600 MG tablet Take 1 tablet (600 mg total) by mouth every 6 (six) hours as needed. 03/22/17   Forde Dandy, MD  LORazepam (ATIVAN) 1 MG tablet Take 1 mg by mouth at bedtime. 11/29/18   [provider]  mirtazapine (REMERON) 15 MG tablet Take 15 mg by mouth at bedtime.  08/22/17   [provider]  zonisamide (ZONEGRAN) 100 MG capsule Take 4 capsules every night 06/01/19   Cameron Sprang, MD    Family History Family History  Problem Relation Age of Onset  . Seizures Mother   . Hypertension Maternal Grandmother     Social History Social History   Tobacco Use    . Smoking status: Current Some Day Smoker    Packs/day: 0.50    Types: Cigarettes  . Smokeless tobacco: Never Used  Vaping Use  . Vaping Use: Never used  Substance Use Topics  . Alcohol use: No    Alcohol/week: 0.0 standard drinks  . Drug use: Yes    Types: Marijuana    Comment: Smokes marijuana three times monthly.     Allergies   Depakote er [divalproex sodium er] and Valproic acid   Review of Systems Review of Systems   Physical Exam Triage Vital Signs ED Triage Vitals  Enc Vitals Group     BP 10/03/20 0821 101/74     Pulse Rate 10/03/20 0821 87     Resp 10/03/20 0821 20     Temp 10/03/20 0821 98.6 F (37 C)     Temp Source 10/03/20 0821 Oral     SpO2 10/03/20 0821 98 %     Weight --      Height --      Head Circumference --      Peak Flow --      Pain Score 10/03/20 0819 0     Pain Loc --      Pain Edu? --      Excl. in [place]? --    No data found.  Updated Vital Signs BP 101/74 (BP  Location: Left Arm)   Pulse 87   Temp 98.6 F (37 C) (Oral)   Resp 20   LMP 09/13/2020   SpO2 98%   Visual Acuity Right Eye Distance:   Left Eye Distance:   Bilateral Distance:    Right Eye Near:   Left Eye Near:    Bilateral Near:     Physical Exam Vitals and nursing note reviewed.  Constitutional:      General: She is not in acute distress.    Appearance: Normal appearance. She is well-developed. She is not ill-appearing, toxic-appearing or diaphoretic.  HENT:     Head: Normocephalic.     Right Ear: Tympanic membrane and ear canal normal.     Left Ear: Tympanic membrane and ear canal normal.     Nose: Nose normal.     Mouth/Throat:     Pharynx: Oropharynx is clear.  Eyes:     Conjunctiva/sclera: Conjunctivae normal.  Cardiovascular:     Rate and Rhythm: Normal rate and regular rhythm.  Pulmonary:     Effort: Pulmonary effort is normal.     Breath sounds: Rhonchi present.     Comments: Harsh cough Abdominal:     Palpations: Abdomen is soft.      Tenderness: There is no abdominal tenderness.  Musculoskeletal:        General: Normal range of motion.     Cervical back: Normal range of motion.  Skin:    General: Skin is warm and dry.     Findings: No rash.  Neurological:     Mental Status: She is alert.  Psychiatric:        Mood and Affect: Mood normal.      UC Treatments / Results  Labs (all labs ordered are listed, but only abnormal results are displayed) Labs Reviewed  SARS CORONAVIRUS 2 (TAT 6-24 HRS)    EKG   Radiology DG Chest 2 View  Result Date: 10/03/2020 CLINICAL DATA:  Cough with chest pain and nasal congestion. EXAM: CHEST - 2 VIEW COMPARISON:  01/28/2019 FINDINGS: The lungs are clear without focal pneumonia, edema, pneumothorax or pleural effusion. The cardiopericardial silhouette is within normal limits for size. The visualized bony structures of the thorax show no acute abnormality. IMPRESSION: No active cardiopulmonary disease. Electronically Signed   By: Misty Stanley M.D.   On: 10/03/2020 09:23    Procedures Procedures (including critical care time)  Medications Ordered in UC Medications - No data to display  Initial Impression / Assessment and Plan / UC Course  I have reviewed the triage vital signs and the nursing notes.  Pertinent labs & imaging results that were available during my care of the patient were reviewed by me and considered in my medical decision making (see chart for details).     Viral URI with cough Chest x-ray without any acute findings. Recommended Mucinex over-the-counter and Tylenol or Profen as needed. Covid swab pending Follow up as needed for continued or worsening symptoms  Final Clinical Impressions(s) / UC Diagnoses   Final diagnoses:  Viral URI with cough     Discharge Instructions     Your x ray did not show any pneumonia This is most likely viral You can continue the OTC medicines as needed.  Recommend mucinex OTC, tylenol and ibuprofen as needed.    You can check your my chart for covid results.       ED Prescriptions    None     PDMP not reviewed this encounter.  Orvan July, NP 10/04/20 1045

## 2020-10-19 ENCOUNTER — Other Ambulatory Visit: Payer: Self-pay | Admitting: Specialist

## 2020-10-19 DIAGNOSIS — R0781 Pleurodynia: Secondary | ICD-10-CM

## 2020-10-30 ENCOUNTER — Other Ambulatory Visit: Payer: Self-pay | Admitting: Specialist

## 2020-10-30 ENCOUNTER — Ambulatory Visit
Admission: RE | Admit: 2020-10-30 | Discharge: 2020-10-30 | Disposition: A | Discharge status: Home / Self Care | Payer: 59 | Source: Ambulatory Visit | Attending: Specialist | Admitting: Specialist

## 2020-10-30 ENCOUNTER — Other Ambulatory Visit: Payer: Self-pay

## 2020-10-30 DIAGNOSIS — M546 Pain in thoracic spine: Secondary | ICD-10-CM

## 2020-10-30 DIAGNOSIS — R0781 Pleurodynia: Secondary | ICD-10-CM

## 2020-11-08 ENCOUNTER — Other Ambulatory Visit (HOSPITAL_COMMUNITY)
Admission: RE | Admit: 2020-11-08 | Discharge: 2020-11-08 | Disposition: A | Discharge status: Home / Self Care | Payer: 59 | Source: Ambulatory Visit | Attending: Family Medicine | Admitting: Family Medicine

## 2020-11-08 ENCOUNTER — Other Ambulatory Visit: Payer: Self-pay | Admitting: Family Medicine

## 2020-11-08 DIAGNOSIS — N898 Other specified noninflammatory disorders of vagina: Secondary | ICD-10-CM | POA: Insufficient documentation

## 2020-11-12 LAB — MOLECULAR ANCILLARY ONLY
Bacterial Vaginitis (gardnerella): NEGATIVE
Candida Glabrata: NEGATIVE
Candida Vaginitis: NEGATIVE
Chlamydia: NEGATIVE
Comment: NEGATIVE
Comment: NEGATIVE
Comment: NEGATIVE
Comment: NEGATIVE
Comment: NEGATIVE
Comment: NORMAL
Neisseria Gonorrhea: NEGATIVE
Trichomonas: NEGATIVE

## 2021-03-13 ENCOUNTER — Emergency Department (HOSPITAL_COMMUNITY): Payer: 59

## 2021-03-13 ENCOUNTER — Emergency Department (HOSPITAL_COMMUNITY)
Admission: EM | Admit: 2021-03-13 | Discharge: 2021-03-13 | Disposition: A | Discharge status: Home / Self Care | Payer: 59 | Attending: Emergency Medicine | Admitting: Emergency Medicine

## 2021-03-13 DIAGNOSIS — Z5321 Procedure and treatment not carried out due to patient leaving prior to being seen by health care provider: Secondary | ICD-10-CM | POA: Insufficient documentation

## 2021-03-13 DIAGNOSIS — R42 Dizziness and giddiness: Secondary | ICD-10-CM | POA: Insufficient documentation

## 2021-03-13 DIAGNOSIS — R079 Chest pain, unspecified: Secondary | ICD-10-CM | POA: Insufficient documentation

## 2021-03-13 DIAGNOSIS — R519 Headache, unspecified: Secondary | ICD-10-CM | POA: Insufficient documentation

## 2021-03-13 LAB — BASIC METABOLIC PANEL
Anion gap: 7 (ref 5–15)
BUN: 10 mg/dL (ref 6–20)
CO2: 23 mmol/L (ref 22–32)
Calcium: 9.1 mg/dL (ref 8.9–10.3)
Chloride: 108 mmol/L (ref 98–111)
Creatinine, Ser: 0.75 mg/dL (ref 0.44–1.00)
GFR, Estimated: >60 mL/min (ref >60)
Glucose, Bld: 110 mg/dL — ABNORMAL HIGH (ref 70–99)
Potassium: 3.8 mmol/L (ref 3.5–5.1)
Sodium: 138 mmol/L (ref 135–145)

## 2021-03-13 LAB — CBC
HCT: 41.7 % (ref 36.0–46.0)
Hemoglobin: 14.2 g/dL (ref 12.0–15.0)
MCH: 32 pg (ref 26.0–34.0)
MCHC: 34.1 g/dL (ref 30.0–36.0)
MCV: 93.9 fL (ref 80.0–100.0)
Platelets: 486 10*3/uL — ABNORMAL HIGH (ref 150–400)
RBC: 4.44 MIL/uL (ref 3.87–5.11)
RDW: 13.2 % (ref 11.5–15.5)
WBC: 10.2 10*3/uL (ref 4.0–10.5)
nRBC: 0 % (ref 0.0–0.2)

## 2021-03-13 LAB — TROPONIN I (HIGH SENSITIVITY): Troponin I (High Sensitivity): 3 ng/L (ref <18)

## 2021-03-13 LAB — I-STAT BETA HCG BLOOD, ED (MC, WL, AP ONLY): I-stat hCG, quantitative: <5 m[IU]/mL (ref <5)

## 2021-03-13 NOTE — ED Notes (Signed)
Pt name called for VS no answer

## 2021-03-13 NOTE — ED Triage Notes (Signed)
Pt came in today c/o of She is experiencingCP/Dizzinness/Headache. She stated this started after possibly having had a seizure last night. Pt is not sure what time this occurred. A/Ox4 Pt states she has a hx of seizure but had never felt this bad after.

## 2021-03-13 NOTE — ED Notes (Signed)
Unable to find pt in waiting room for vital signs. 

## 2021-03-21 ENCOUNTER — Other Ambulatory Visit: Payer: Self-pay | Admitting: Family Medicine

## 2021-03-21 ENCOUNTER — Other Ambulatory Visit (HOSPITAL_COMMUNITY)
Admission: RE | Admit: 2021-03-21 | Discharge: 2021-03-21 | Disposition: A | Discharge status: Home / Self Care | Payer: Medicaid Other | Source: Ambulatory Visit | Attending: Family Medicine | Admitting: Family Medicine

## 2021-03-21 DIAGNOSIS — N898 Other specified noninflammatory disorders of vagina: Secondary | ICD-10-CM | POA: Diagnosis not present

## 2021-03-21 DIAGNOSIS — N760 Acute vaginitis: Secondary | ICD-10-CM | POA: Diagnosis not present

## 2021-03-21 DIAGNOSIS — B9689 Other specified bacterial agents as the cause of diseases classified elsewhere: Secondary | ICD-10-CM | POA: Diagnosis not present

## 2021-03-21 DIAGNOSIS — Z113 Encounter for screening for infections with a predominantly sexual mode of transmission: Secondary | ICD-10-CM | POA: Diagnosis not present

## 2021-03-24 LAB — MOLECULAR ANCILLARY ONLY
Bacterial Vaginitis (gardnerella): POSITIVE — AB
Candida Glabrata: NEGATIVE
Candida Vaginitis: NEGATIVE
Chlamydia: NEGATIVE
Comment: NEGATIVE
Comment: NEGATIVE
Comment: NEGATIVE
Comment: NEGATIVE
Comment: NEGATIVE
Comment: NORMAL
Neisseria Gonorrhea: NEGATIVE
Trichomonas: NEGATIVE

## 2021-05-20 ENCOUNTER — Encounter (HOSPITAL_COMMUNITY): Payer: Self-pay

## 2021-05-20 ENCOUNTER — Other Ambulatory Visit: Payer: Self-pay

## 2021-05-20 ENCOUNTER — Ambulatory Visit (HOSPITAL_COMMUNITY)
Admission: EM | Admit: 2021-05-20 | Discharge: 2021-05-20 | Disposition: A | Discharge status: Home / Self Care | Payer: 59 | Attending: Family Medicine | Admitting: Family Medicine

## 2021-05-20 DIAGNOSIS — F17210 Nicotine dependence, cigarettes, uncomplicated: Secondary | ICD-10-CM | POA: Diagnosis not present

## 2021-05-20 DIAGNOSIS — J069 Acute upper respiratory infection, unspecified: Secondary | ICD-10-CM | POA: Insufficient documentation

## 2021-05-20 DIAGNOSIS — Z20822 Contact with and (suspected) exposure to covid-19: Secondary | ICD-10-CM | POA: Diagnosis not present

## 2021-05-20 DIAGNOSIS — F1290 Cannabis use, unspecified, uncomplicated: Secondary | ICD-10-CM | POA: Insufficient documentation

## 2021-05-20 DIAGNOSIS — Z113 Encounter for screening for infections with a predominantly sexual mode of transmission: Secondary | ICD-10-CM | POA: Insufficient documentation

## 2021-05-20 DIAGNOSIS — R059 Cough, unspecified: Secondary | ICD-10-CM | POA: Diagnosis present

## 2021-05-20 LAB — SARS CORONAVIRUS 2 (TAT 6-24 HRS): SARS Coronavirus 2: NEGATIVE

## 2021-05-20 LAB — HIV ANTIBODY (ROUTINE TESTING W REFLEX): HIV Screen 4th Generation wRfx: NONREACTIVE

## 2021-05-20 MED ORDER — PROMETHAZINE-DM 6.25-15 MG/5ML PO SYRP: 5.0000 mL | ORAL_SOLUTION | Freq: Four times a day (QID) | ORAL | 0 refills | Status: DC | PRN

## 2021-05-20 MED ORDER — ALBUTEROL SULFATE HFA 108 (90 BASE) MCG/ACT IN AERS: 1.0000 | INHALATION_SPRAY | Freq: Four times a day (QID) | RESPIRATORY_TRACT | 0 refills | Status: DC | PRN

## 2021-05-20 NOTE — ED Provider Notes (Signed)
[place]    CSN: [id number] Arrival date & time: 05/20/21  1050      History   Chief Complaint Chief Complaint  Patient presents with  . Cough  . Headache  . Nasal Congestion    HPI Virginia Bradley is a 35 y.o. female.   Patient presenting today with 2 to 3-day history of congestion, headache, dry hacking cough, mild body aches.  Denies known fever, chest pain, shortness of breath, abdominal pain, nausea vomiting or diarrhea.  So far trying her typical allergy regimen, DayQuil, Mucinex with minimal relief.  No known history of asthma or other lung disease.  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  Is fully vaccinated. She is also requesting STD screening while she is here today.  She is asymptomatic, no new exposures or new sexual partners but wanting to make sure everything is negative.     Past Medical History:  Diagnosis Date  . Headache   . Seizures [facility])     Patient Active Problem List   Diagnosis Date Noted  . Recurrent major depressive disorder, in partial remission ([place]) 05/19/2019  . Localization-related idiopathic epilepsy and epileptic syndromes with seizures of localized onset, not intractable, without status epilepticus ([place]) 07/20/2018  . Seizure disorder ([place]) 03/24/2016  . Migraine 03/24/2016    Past Surgical History:  Procedure Laterality Date  . APPENDECTOMY    . ovary removed      OB History   No obstetric history on file.      Home Medications    Prior to Admission medications   Medication Sig Start Date End Date Taking? Authorizing Provider  albuterol (VENTOLIN HFA) 108 (90 Base) MCG/ACT inhaler Inhale 1-2 puffs into the lungs every 6 (six) hours as needed for wheezing or shortness of breath. 05/20/21  Yes Volney American, PA-C  promethazine-dextromethorphan (PROMETHAZINE-DM) 6.25-15 MG/5ML syrup Take 5 mLs by mouth 4  (four) times daily as needed for cough. 05/20/21  Yes Volney American, PA-C  acetaminophen (TYLENOL) 500 MG tablet Take 1,000 mg by mouth daily as needed for mild pain.    [provider]  ibuprofen (ADVIL,MOTRIN) 600 MG tablet Take 1 tablet (600 mg total) by mouth every 6 (six) hours as needed. 03/22/17   Forde Dandy, MD  LORazepam (ATIVAN) 1 MG tablet Take 1 mg by mouth at bedtime. 11/29/18   [provider]  mirtazapine (REMERON) 15 MG tablet Take 15 mg by mouth at bedtime.  08/22/17   [provider]  zonisamide (ZONEGRAN) 100 MG capsule Take 4 capsules every night 06/01/19   Cameron Sprang, MD    Family History Family History  Problem Relation Age of Onset  . Seizures Mother   . Hypertension Maternal Grandmother     Social History Social History   Tobacco Use  . Smoking status: Current Some Day Smoker    Packs/day: 0.50    Types: Cigarettes  . Smokeless tobacco: Never Used  Vaping Use  . Vaping Use: Never used  Substance Use Topics  . Alcohol use: No    Alcohol/week: 0.0 standard drinks  . Drug use: Yes    Types: Marijuana    Comment: Smokes marijuana three times monthly.     Allergies   Depakote er [divalproex sodium er] and Valproic acid   Review of Systems Review of Systems Per HPI  Physical Exam Triage Vital  Signs ED Triage Vitals  Enc Vitals Group     BP 05/20/21 1110 112/82     Pulse Rate 05/20/21 1110 98     Resp 05/20/21 1110 20     Temp 05/20/21 1110 98.9 F (37.2 C)     Temp Source 05/20/21 1110 Oral     SpO2 05/20/21 1110 100 %     Weight --      Height --      Head Circumference --      Peak Flow --      Pain Score 05/20/21 1109 0     Pain Loc --      Pain Edu? --      Excl. in [place]? --    No data found.  Updated Vital Signs BP 112/82   Pulse 98   Temp 98.9 F (37.2 C) (Oral)   Resp 20   LMP 05/05/2021 (Exact Date)   SpO2 100%   Visual Acuity Right Eye Distance:   Left Eye Distance:   Bilateral  Distance:    Right Eye Near:   Left Eye Near:    Bilateral Near:     Physical Exam Vitals and nursing note reviewed.  Constitutional:      Appearance: Normal appearance. She is not ill-appearing.  HENT:     Head: Atraumatic.     Right Ear: Tympanic membrane normal.     Left Ear: Tympanic membrane normal.     Nose: Rhinorrhea present.     Mouth/Throat:     Mouth: Mucous membranes are moist.     Pharynx: Oropharynx is clear. Posterior oropharyngeal erythema present. No oropharyngeal exudate.  Eyes:     Extraocular Movements: Extraocular movements intact.     Conjunctiva/sclera: Conjunctivae normal.  Cardiovascular:     Rate and Rhythm: Normal rate and regular rhythm.     Heart sounds: Normal heart sounds.  Pulmonary:     Effort: Pulmonary effort is normal. No respiratory distress.     Breath sounds: Normal breath sounds. No wheezing or rales.  Abdominal:     General: Bowel sounds are normal. There is no distension.     Palpations: Abdomen is soft.     Tenderness: There is no abdominal tenderness. There is no guarding.  Musculoskeletal:        General: Normal range of motion.     Cervical back: Normal range of motion and neck supple.  Skin:    General: Skin is warm and dry.  Neurological:     Mental Status: She is alert and oriented to person, place, and time.  Psychiatric:        Mood and Affect: Mood normal.        Thought Content: Thought content normal.        Judgment: Judgment normal.      UC Treatments / Results  Labs (all labs ordered are listed, but only abnormal results are displayed) Labs Reviewed  SARS CORONAVIRUS 2 (TAT 6-24 HRS)  RPR  HIV ANTIBODY (ROUTINE TESTING W REFLEX)  CERVICOVAGINAL ANCILLARY ONLY    EKG   Radiology No results found.  Procedures Procedures (including critical care time)  Medications Ordered in UC Medications - No data to display  Initial Impression / Assessment and Plan / UC Course  I have reviewed the triage  vital signs and the nursing notes.  Pertinent labs & imaging results that were available during my care of the patient were reviewed by me and considered in my medical decision  making (see chart for details).     Given symptoms and exposures, suspect COVID-19.  COVID PCR pending, Phenergan DM, albuterol, other over-the-counter supportive medications and home care reviewed.  Return for acutely worsening symptoms, work note given.  STD screening pending as well, abstinence until results return reviewed, safe sexual practices discussed.  Treat based on these results.  Final Clinical Impressions(s) / UC Diagnoses   Final diagnoses:  Viral URI with cough  Routine screening for STI (sexually transmitted infection)   Discharge Instructions   None    ED Prescriptions    Medication Sig Dispense Auth. Provider   promethazine-dextromethorphan (PROMETHAZINE-DM) 6.25-15 MG/5ML syrup Take 5 mLs by mouth 4 (four) times daily as needed for cough. 100 mL Volney American, PA-C   albuterol (VENTOLIN HFA) 108 (90 Base) MCG/ACT inhaler Inhale 1-2 puffs into the lungs every 6 (six) hours as needed for wheezing or shortness of breath. 18 g Volney American, [place]     PDMP not reviewed this encounter.   Volney American, [place] 05/20/21 1156

## 2021-05-20 NOTE — ED Triage Notes (Addendum)
Pt in with c/o dry cough, headache, congestion. Pt states she has chest pain that worsens when she cough. 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

## 2021-05-21 LAB — CERVICOVAGINAL ANCILLARY ONLY
Bacterial Vaginitis (gardnerella): POSITIVE — AB
Candida Glabrata: NEGATIVE
Candida Vaginitis: NEGATIVE
Chlamydia: NEGATIVE
Comment: NEGATIVE
Comment: NEGATIVE
Comment: NEGATIVE
Comment: NEGATIVE
Comment: NEGATIVE
Comment: NORMAL
Neisseria Gonorrhea: NEGATIVE
Trichomonas: NEGATIVE

## 2021-05-21 LAB — RPR: RPR Ser Ql: NONREACTIVE

## 2021-05-22 ENCOUNTER — Telehealth (HOSPITAL_COMMUNITY): Payer: Self-pay | Admitting: Emergency Medicine

## 2021-05-22 MED ORDER — METRONIDAZOLE 500 MG PO TABS: 500.0000 mg | ORAL_TABLET | Freq: Two times a day (BID) | ORAL | 0 refills | Status: DC

## 2021-05-24 ENCOUNTER — Ambulatory Visit (HOSPITAL_COMMUNITY)
Admission: EM | Admit: 2021-05-24 | Discharge: 2021-05-24 | Disposition: A | Discharge status: Home / Self Care | Payer: 59 | Attending: Physician Assistant | Admitting: Physician Assistant

## 2021-05-24 ENCOUNTER — Encounter (HOSPITAL_COMMUNITY): Payer: Self-pay | Admitting: Emergency Medicine

## 2021-05-24 ENCOUNTER — Encounter (HOSPITAL_COMMUNITY): Payer: Self-pay

## 2021-05-24 ENCOUNTER — Emergency Department (HOSPITAL_COMMUNITY)
Admission: EM | Admit: 2021-05-24 | Discharge: 2021-05-25 | Disposition: A | Discharge status: Home / Self Care | Payer: 59 | Attending: Emergency Medicine | Admitting: Emergency Medicine

## 2021-05-24 ENCOUNTER — Other Ambulatory Visit: Payer: Self-pay

## 2021-05-24 DIAGNOSIS — K2900 Acute gastritis without bleeding: Secondary | ICD-10-CM | POA: Insufficient documentation

## 2021-05-24 DIAGNOSIS — K921 Melena: Secondary | ICD-10-CM | POA: Diagnosis not present

## 2021-05-24 DIAGNOSIS — F17210 Nicotine dependence, cigarettes, uncomplicated: Secondary | ICD-10-CM | POA: Insufficient documentation

## 2021-05-24 DIAGNOSIS — R1010 Upper abdominal pain, unspecified: Secondary | ICD-10-CM | POA: Diagnosis present

## 2021-05-24 DIAGNOSIS — R1013 Epigastric pain: Secondary | ICD-10-CM

## 2021-05-24 DIAGNOSIS — R112 Nausea with vomiting, unspecified: Secondary | ICD-10-CM

## 2021-05-24 LAB — COMPREHENSIVE METABOLIC PANEL
ALT: 16 U/L (ref 0–44)
AST: 15 U/L (ref 15–41)
Albumin: 4.1 g/dL (ref 3.5–5.0)
Alkaline Phosphatase: 91 U/L (ref 38–126)
Anion gap: 9 (ref 5–15)
BUN: 11 mg/dL (ref 6–20)
CO2: 21 mmol/L — ABNORMAL LOW (ref 22–32)
Calcium: 9.4 mg/dL (ref 8.9–10.3)
Chloride: 104 mmol/L (ref 98–111)
Creatinine, Ser: 0.72 mg/dL (ref 0.44–1.00)
GFR, Estimated: >60 mL/min (ref >60)
Glucose, Bld: 99 mg/dL (ref 70–99)
Potassium: 3.7 mmol/L (ref 3.5–5.1)
Sodium: 134 mmol/L — ABNORMAL LOW (ref 135–145)
Total Bilirubin: 0.6 mg/dL (ref 0.3–1.2)
Total Protein: 6.9 g/dL (ref 6.5–8.1)

## 2021-05-24 LAB — ABO/RH: ABO/RH(D): B POS

## 2021-05-24 LAB — CBC WITH DIFFERENTIAL/PLATELET
Abs Immature Granulocytes: 0.05 10*3/uL (ref 0.00–0.07)
Basophils Absolute: 0 10*3/uL (ref 0.0–0.1)
Basophils Relative: 0 %
Eosinophils Absolute: 0.2 10*3/uL (ref 0.0–0.5)
Eosinophils Relative: 1 %
HCT: 44.6 % (ref 36.0–46.0)
Hemoglobin: 14.9 g/dL (ref 12.0–15.0)
Immature Granulocytes: 0 %
Lymphocytes Relative: 22 %
Lymphs Abs: 2.9 10*3/uL (ref 0.7–4.0)
MCH: 31.6 pg (ref 26.0–34.0)
MCHC: 33.4 g/dL (ref 30.0–36.0)
MCV: 94.7 fL (ref 80.0–100.0)
Monocytes Absolute: 1 10*3/uL (ref 0.1–1.0)
Monocytes Relative: 8 %
Neutro Abs: 9.3 10*3/uL — ABNORMAL HIGH (ref 1.7–7.7)
Neutrophils Relative %: 69 %
Platelets: 524 10*3/uL — ABNORMAL HIGH (ref 150–400)
RBC: 4.71 MIL/uL (ref 3.87–5.11)
RDW: 13.3 % (ref 11.5–15.5)
WBC: 13.5 10*3/uL — ABNORMAL HIGH (ref 4.0–10.5)
nRBC: 0 % (ref 0.0–0.2)

## 2021-05-24 LAB — TYPE AND SCREEN
ABO/RH(D): B POS
Antibody Screen: NEGATIVE

## 2021-05-24 LAB — LIPASE, BLOOD: Lipase: 53 U/L — ABNORMAL HIGH (ref 11–51)

## 2021-05-24 LAB — I-STAT BETA HCG BLOOD, ED (MC, WL, AP ONLY): I-stat hCG, quantitative: <5 m[IU]/mL (ref <5)

## 2021-05-24 MED ORDER — ONDANSETRON 4 MG PO TBDP
4.0000 mg | ORAL_TABLET | Freq: Once | ORAL | Status: AC
  Administered 2021-05-24: 4 mg via ORAL

## 2021-05-24 MED ORDER — ONDANSETRON 4 MG PO TBDP
ORAL_TABLET | ORAL | Status: AC
  Filled 2021-05-24: qty 1

## 2021-05-24 MED ORDER — ONDANSETRON 4 MG PO TBDP: 4.0000 mg | ORAL_TABLET | Freq: Once | ORAL | Status: DC

## 2021-05-24 NOTE — ED Provider Notes (Signed)
[place]    CSN: [id number] Arrival date & time: 05/24/21  1849      History   Chief Complaint Chief Complaint  Patient presents with  . Abdominal Pain    nau  . Nausea  . Emesis    HPI Joyce Heitman is a 35 y.o. female.   Patient presents today with a 4 to 5-day history of abdominal pain.  Reports symptoms began after she was prescribed Promethazine DM on 05/20/2021 for cough symptoms.  She reports abdominal pain is rated 9 on a 0-10 pain scale, generalized throughout abdomen but worse in epigastrium, described as aching, no aggravating relieving factors identified.  She has not tried any over-the-counter medications including Pepto-Bismol.  She does report melena for the same timeframe and denies history of peptic ulcer disease.  She denies regular NSAID use and does not take iron supplements.  She does not drink alcohol and takes no diabetes medication including GLP-1 agonist.  She denies history of gastrointestinal disorder.  She has had difficulty keeping liquids and foods down since symptom onset including water.     Past Medical History:  Diagnosis Date  . Headache   . Seizures [facility])     Patient Active Problem List   Diagnosis Date Noted  . Recurrent major depressive disorder, in partial remission ([place]) 05/19/2019  . Localization-related idiopathic epilepsy and epileptic syndromes with seizures of localized onset, not intractable, without status epilepticus ([place]) 07/20/2018  . Seizure disorder ([place]) 03/24/2016  . Migraine 03/24/2016    Past Surgical History:  Procedure Laterality Date  . APPENDECTOMY    . ovary removed      OB History   No obstetric history on file.      Home Medications    Prior to Admission medications   Medication Sig Start Date End Date Taking? Authorizing Provider  acetaminophen (TYLENOL) 500 MG tablet Take 1,000 mg by mouth daily as needed for mild pain.    [provider]  albuterol (VENTOLIN HFA) 108 (90  Base) MCG/ACT inhaler Inhale 1-2 puffs into the lungs every 6 (six) hours as needed for wheezing or shortness of breath. 05/20/21   Volney American, PA-C  ibuprofen (ADVIL,MOTRIN) 600 MG tablet Take 1 tablet (600 mg total) by mouth every 6 (six) hours as needed. 03/22/17   Forde Dandy, MD  LORazepam (ATIVAN) 1 MG tablet Take 1 mg by mouth at bedtime. 11/29/18   [provider]  metroNIDAZOLE (FLAGYL) 500 MG tablet Take 1 tablet (500 mg total) by mouth 2 (two) times daily. 05/22/21   Chase Picket, MD  mirtazapine (REMERON) 15 MG tablet Take 15 mg by mouth at bedtime.  08/22/17   [provider]  promethazine-dextromethorphan (PROMETHAZINE-DM) 6.25-15 MG/5ML syrup Take 5 mLs by mouth 4 (four) times daily as needed for cough. 05/20/21   Volney American, PA-C  zonisamide [facility]) 100 MG capsule Take 4 capsules every night 06/01/19   Cameron Sprang, MD    Family History Family History  Problem Relation Age of Onset  . Seizures Mother   . Hypertension Maternal Grandmother     Social History Social History   Tobacco Use  . Smoking status: Current Some Day Smoker    Packs/day: 0.50    Types: Cigarettes  . Smokeless tobacco: Never Used  Vaping Use  . Vaping Use: Never used  Substance Use Topics  . Alcohol use: No    Alcohol/week: 0.0 standard drinks  . Drug use: Yes  Types: Marijuana    Comment: Smokes marijuana three times monthly.     Allergies   Depakote er [divalproex sodium er] and Valproic acid   Review of Systems Review of Systems  Constitutional: Positive for activity change, appetite change and fatigue. Negative for fever.  Respiratory: Negative for cough and shortness of breath.   Cardiovascular: Negative for chest pain.  Gastrointestinal: Positive for abdominal pain, blood in stool, diarrhea, nausea and vomiting. Negative for constipation.  Musculoskeletal: Negative for arthralgias and myalgias.  Neurological: Positive for  light-headedness. Negative for dizziness and headaches.     Physical Exam Triage Vital Signs ED Triage Vitals  Enc Vitals Group     BP 05/24/21 1924 118/72     Pulse Rate 05/24/21 1924 (!) 109     Resp 05/24/21 1924 20     Temp 05/24/21 1924 97.7 F (36.5 C)     Temp Source 05/24/21 1924 Oral     SpO2 05/24/21 1924 100 %     Weight --      Height --      Head Circumference --      Peak Flow --      Pain Score 05/24/21 1925 7     Pain Loc --      Pain Edu? --      Excl. in [place]? --    No data found.  Updated Vital Signs BP 118/72 (BP Location: Right Arm)   Pulse (!) 109   Temp 97.7 F (36.5 C) (Oral)   Resp 20   LMP 05/05/2021 (Exact Date)   SpO2 100%   Visual Acuity Right Eye Distance:   Left Eye Distance:   Bilateral Distance:    Right Eye Near:   Left Eye Near:    Bilateral Near:     Physical Exam Vitals reviewed.  Constitutional:      General: She is awake. She is not in acute distress.    Appearance: Normal appearance. She is ill-appearing.     Comments: Very pleasant female appears stated age in no acute distress  HENT:     Head: Normocephalic and atraumatic.  Cardiovascular:     Rate and Rhythm: Regular rhythm. Tachycardia present.     Heart sounds: Normal heart sounds. No murmur heard.   Pulmonary:     Effort: Pulmonary effort is normal.     Breath sounds: Normal breath sounds. No wheezing, rhonchi or rales.     Comments: Clear to auscultation bilaterally Abdominal:     General: Bowel sounds are normal.     Palpations: Abdomen is soft.     Tenderness: There is abdominal tenderness in the right upper quadrant and epigastric area. There is guarding. There is no right CVA tenderness, left CVA tenderness or rebound.     Comments: Significant tenderness palpation throughout upper abdomen particularly in epigastric region.  Psychiatric:        Behavior: Behavior is cooperative.      UC Treatments / Results  Labs (all labs ordered are listed,  but only abnormal results are displayed) Labs Reviewed - No data to display  EKG   Radiology No results found.  Procedures Procedures (including critical care time)  Medications Ordered in UC Medications  ondansetron (ZOFRAN-ODT) disintegrating tablet 4 mg (4 mg Oral Given 05/24/21 1946)    Initial Impression / Assessment and Plan / UC Course  I have reviewed the triage vital signs and the nursing notes.  Pertinent labs & imaging results that were available during  my care of the patient were reviewed by me and considered in my medical decision making (see chart for details).     Given severity of pain and tenderness to palpation on exam discussed the need for advanced imaging for further evaluation.  Concern for peptic ulcer disease with associated anemia versus pancreatitis.  Unfortunately, we are unable to obtain CT scan in urgent care so patient was referred to the emergency room for further evaluation.  She was given 1 dose of Zofran to help manage symptoms.  Vital signs are stable at the time of discharge and patient was safe for private transport.  She will go directly to [facility] emergency room following visit for further evaluation and management.  Final Clinical Impressions(s) / UC Diagnoses   Final diagnoses:  Epigastric abdominal pain  Nausea and vomiting, intractability of vomiting not specified, unspecified vomiting type  Melena     Discharge Instructions     GO TO ER.     ED Prescriptions    None     PDMP not reviewed this encounter.   Terrilee Croak, PA-C 05/24/21 1950

## 2021-05-24 NOTE — ED Triage Notes (Signed)
Patient reports epigastric pain with emesis and black stools this week , denies fever or chills .

## 2021-05-24 NOTE — ED Notes (Signed)
Patient is being discharged from the Urgent Care and sent to the Emergency Department via POV . Per Raspet PA, patient is in need of higher level of care due to severe epigastric pain, Nausea/vomiting, melena. Patient is aware and verbalizes understanding of plan of care.  Vitals:   05/24/21 1924  BP: 118/72  Pulse: (!) 109  Resp: 20  Temp: 97.7 F (36.5 C)  SpO2: 100%

## 2021-05-24 NOTE — ED Provider Notes (Signed)
Emergency Medicine Provider Triage Evaluation Note  [facility] , a 35 y.o. female  was evaluated in triage.  Pt complains of epigastric pain since May 30.  Also having black-colored stools.  Reports nausea and vomiting.  Sent by urgent care.  Review of Systems  Positive: Abdominal pain, black-colored stools and vomiting Negative: Fever  Physical Exam  BP 111/68 (BP Location: Right Arm)   Pulse (!) 106   Temp 98.5 F (36.9 C) (Oral)   Resp 16   Ht 5\' 7"  (1.702 m)   Wt 86.2 kg   LMP 05/05/2021 (Exact Date)   SpO2 99%   BMI 29.76 kg/m  Gen:   Awake, no distress   Resp:  Normal effort  MSK:   Moves extremities without difficulty  Other:  No vomiting on exam  Medical Decision Making  Medically screening exam initiated at 8:45 PM.  Appropriate orders placed.  [facility] was informed that the remainder of the evaluation will be completed by another provider, this initial triage assessment does not replace that evaluation, and the importance of remaining in the ED until their evaluation is complete.  Labs ordered   Delia Heady, Hershal Coria 05/24/21 2046    Tegeler, Gwenyth Allegra, MD 05/24/21 2122

## 2021-05-24 NOTE — ED Triage Notes (Signed)
Pt presents with epigastric abdominal pain, nausea, vomiting, and black stools X 4 days.

## 2021-05-24 NOTE — Discharge Instructions (Addendum)
GO TO ER °

## 2021-05-25 ENCOUNTER — Emergency Department (HOSPITAL_COMMUNITY): Payer: 59

## 2021-05-25 LAB — POC OCCULT BLOOD, ED: Fecal Occult Bld: NEGATIVE

## 2021-05-25 LAB — URINALYSIS, ROUTINE W REFLEX MICROSCOPIC
Bilirubin Urine: NEGATIVE
Glucose, UA: NEGATIVE mg/dL
Ketones, ur: 5 mg/dL — AB
Nitrite: NEGATIVE
Protein, ur: NEGATIVE mg/dL
Specific Gravity, Urine: 1.027 (ref 1.005–1.030)
pH: 5 (ref 5.0–8.0)

## 2021-05-25 MED ORDER — SUCRALFATE 1 GM/10ML PO SUSP: 1.0000 g | Freq: Three times a day (TID) | ORAL | 0 refills | Status: DC

## 2021-05-25 MED ORDER — PANTOPRAZOLE SODIUM 40 MG PO TBEC
40.0000 mg | DELAYED_RELEASE_TABLET | Freq: Every day | ORAL | 3 refills | Status: DC
Start: 2021-05-25 — End: 2022-10-06

## 2021-05-25 MED ORDER — SODIUM CHLORIDE 0.9 % IV BOLUS
1000.0000 mL | Freq: Once | INTRAVENOUS | Status: AC
  Administered 2021-05-25: 1000 mL via INTRAVENOUS

## 2021-05-25 MED ORDER — ONDANSETRON HCL 4 MG/2ML IJ SOLN
4.0000 mg | Freq: Once | INTRAMUSCULAR | Status: AC
  Administered 2021-05-25: 4 mg via INTRAVENOUS
  Filled 2021-05-25: qty 2

## 2021-05-25 MED ORDER — PANTOPRAZOLE SODIUM 40 MG IV SOLR
40.0000 mg | Freq: Once | INTRAVENOUS | Status: AC
  Administered 2021-05-25: 40 mg via INTRAVENOUS
  Filled 2021-05-25: qty 40

## 2021-05-25 NOTE — ED Provider Notes (Signed)
[facility]Y [facility]MENT Provider Note   CSN: [id number] Arrival date & time: 05/24/21  2001     History Chief Complaint  Patient presents with  . Abdominal Pain    [facility] is a 35 y.o. female.  Patient presents to the emergency department for evaluation of abdominal pain.  Patient reports that she started having pain on May 30.  Pain is in the center of her upper abdomen and radiates up into the chest and back at times.  It feels like acid reflux.  Over the last 24 hours, pain has been calm continuous and has intensified.  She has had nausea and vomiting through the day today.  Patient reports that her stools have been dark but has not had any hematemesis.        Past Medical History:  Diagnosis Date  . Headache   . Seizures [facility])     Patient Active Problem List   Diagnosis Date Noted  . Recurrent major depressive disorder, in partial remission ([place]) 05/19/2019  . Localization-related idiopathic epilepsy and epileptic syndromes with seizures of localized onset, not intractable, without status epilepticus ([place]) 07/20/2018  . Seizure disorder ([place]) 03/24/2016  . Migraine 03/24/2016    Past Surgical History:  Procedure Laterality Date  . APPENDECTOMY    . ovary removed       OB History   No obstetric history on file.     Family History  Problem Relation Age of Onset  . Seizures Mother   . Hypertension Maternal Grandmother     Social History   Tobacco Use  . Smoking status: Current Some Day Smoker    Packs/day: 0.50    Types: Cigarettes  . Smokeless tobacco: Never Used  Vaping Use  . Vaping Use: Never used  Substance Use Topics  . Alcohol use: No    Alcohol/week: 0.0 standard drinks  . Drug use: Yes    Types: Marijuana    Comment: Smokes marijuana three times monthly.    Home Medications Prior to Admission medications   Medication Sig Start Date End Date Taking? Authorizing Provider  acetaminophen (TYLENOL) 500 MG  tablet Take 1,000 mg by mouth daily as needed for mild pain.   Yes [provider]  albuterol (VENTOLIN HFA) 108 (90 Base) MCG/ACT inhaler Inhale 1-2 puffs into the lungs every 6 (six) hours as needed for wheezing or shortness of breath. 05/20/21  Yes Volney American, PA-C  metroNIDAZOLE (FLAGYL) 500 MG tablet Take 1 tablet (500 mg total) by mouth 2 (two) times daily. 05/22/21  Yes Lamptey, Myrene Galas, MD  montelukast (SINGULAIR) 10 MG tablet Take 1 tablet by mouth at bedtime as needed (allergies/breathing.). 03/21/21  Yes [provider]  promethazine-dextromethorphan (PROMETHAZINE-DM) 6.25-15 MG/5ML syrup Take 5 mLs by mouth 4 (four) times daily as needed for cough. Patient not taking: No sig reported 05/20/21   Volney American, PA-C  zonisamide [facility]) 100 MG capsule Take 4 capsules every night Patient not taking: No sig reported 06/01/19   Cameron Sprang, MD    Allergies    Depakote er [place] sodium er], Valproic acid, and Phenytoin sodium extended  Review of Systems   Review of Systems  Gastrointestinal: Positive for abdominal pain, nausea and vomiting.  All other systems reviewed and are negative.   Physical Exam Updated Vital Signs BP 101/63   Pulse 81   Temp 98.4 F (36.9 C) (Oral)   Resp 18   Ht 5\' 7"  (1.702 m)  Wt 92 kg   LMP 05/05/2021 (Exact Date)   SpO2 100%   BMI 31.77 kg/m   Physical Exam Vitals and nursing note reviewed.  Constitutional:      General: She is not in acute distress.    Appearance: Normal appearance. She is well-developed.  HENT:     Head: Normocephalic and atraumatic.     Right Ear: Hearing normal.     Left Ear: Hearing normal.     Nose: Nose normal.  Eyes:     Conjunctiva/sclera: Conjunctivae normal.     Pupils: Pupils are equal, round, and reactive to light.  Cardiovascular:     Rate and Rhythm: Regular rhythm.     Heart sounds: S1 normal and S2 normal. No murmur heard. No friction rub. No gallop.    Pulmonary:     Effort: Pulmonary effort is normal. No respiratory distress.     Breath sounds: Normal breath sounds.  Chest:     Chest wall: No tenderness.  Abdominal:     General: Bowel sounds are normal.     Palpations: Abdomen is soft.     Tenderness: There is abdominal tenderness in the epigastric area. There is no guarding or rebound. Negative signs include Murphy's sign and McBurney's sign.     Hernia: No hernia is present.  Musculoskeletal:        General: Normal range of motion.     Cervical back: Normal range of motion and neck supple.  Skin:    General: Skin is warm and dry.     Findings: No rash.  Neurological:     Mental Status: She is alert and oriented to person, place, and time.     GCS: GCS eye subscore is 4. GCS verbal subscore is 5. GCS motor subscore is 6.     Cranial Nerves: No cranial nerve deficit.     Sensory: No sensory deficit.     Coordination: Coordination normal.  Psychiatric:        Speech: Speech normal.        Behavior: Behavior normal.        Thought Content: Thought content normal.     ED Results / Procedures / Treatments   Labs (all labs ordered are listed, but only abnormal results are displayed) Labs Reviewed  COMPREHENSIVE METABOLIC PANEL - Abnormal; Notable for the following components:      Result Value   Sodium 134 (*)    CO2 21 (*)    All other components within normal limits  CBC WITH DIFFERENTIAL/PLATELET - Abnormal; Notable for the following components:   WBC 13.5 (*)    Platelets 524 (*)    Neutro Abs 9.3 (*)    All other components within normal limits  LIPASE, BLOOD - Abnormal; Notable for the following components:   Lipase 53 (*)    All other components within normal limits  URINALYSIS, ROUTINE W REFLEX MICROSCOPIC - Abnormal; Notable for the following components:   APPearance CLOUDY (*)    Hgb urine dipstick SMALL (*)    Ketones, ur 5 (*)    Leukocytes,Ua MODERATE (*)    Bacteria, UA RARE (*)    All other  components within normal limits  I-STAT BETA HCG BLOOD, ED ([facility], [facility], [facility] ONLY)  POC OCCULT BLOOD, ED  TYPE AND SCREEN  ABO/RH    EKG None  Radiology US ABDOMEN LIMITED RUQ (LIVER/GB)  Result Date: 05/25/2021 CLINICAL DATA:  Upper abdominal pain EXAM: ULTRASOUND ABDOMEN LIMITED RIGHT UPPER QUADRANT COMPARISON:  None. FINDINGS: Gallbladder: No gallstones or wall thickening visualized. No sonographic Murphy sign noted by sonographer. Common bile duct: Diameter: 5 mm in proximal diameter Liver: No focal lesion identified. Within normal limits in parenchymal echogenicity. Portal vein is patent on color Doppler imaging with normal direction of blood flow towards the liver. Other: None. IMPRESSION: Unremarkable examination Electronically Signed   By: Fidela Salisbury MD   On: 05/25/2021 03:05    Procedures Procedures   Medications Ordered in ED Medications  pantoprazole (PROTONIX) injection 40 mg (40 mg Intravenous Given 05/25/21 0135)  sodium chloride 0.9 % bolus 1,000 mL (0 mLs Intravenous Stopped 05/25/21 0253)  ondansetron (ZOFRAN) injection 4 mg (4 mg Intravenous Given 05/25/21 0135)    ED Course  I have reviewed the triage vital signs and the nursing notes.  Pertinent labs & imaging results that were available during my care of the patient were reviewed by me and considered in my medical decision making (see chart for details).    MDM Rules/Calculators/A&P                          Patient presents to the emergency department for evaluation of abdominal pain.  Patient has been having progressively worsening upper abdominal pain since May 30.  Pain has become persistent over the last 24 hours and she has had nausea and vomiting.  She reports that initially it was vomiting food, then became yellow bile.  No blood in the vomit.  She has noticed dark stools but her Hemoccult was negative today.  Lab work is reassuring.  Normal LFTs.  No elevation of BUN to suggest digestion of blood.  Hemoglobin is  normal.  Ultrasound does not show any evidence of gallstone.  No lower abdominal pain or tenderness on exam to suggest appendicitis or pelvic etiology.  Suspect gastritis, patient does have a history of reflux.  Will aggressively treat, follow-up with GI on-call.  Final Clinical Impression(s) / ED Diagnoses Final diagnoses:  Acute gastritis without hemorrhage, unspecified gastritis type    Rx / DC Orders ED Discharge Orders    None       Orpah Greek, MD 05/25/21 [phone]

## 2021-05-25 NOTE — ED Notes (Signed)
Assisted provider with rectal exam

## 2021-05-28 ENCOUNTER — Encounter: Payer: Self-pay | Admitting: Gastroenterology

## 2021-06-24 ENCOUNTER — Other Ambulatory Visit: Payer: Self-pay

## 2021-06-24 ENCOUNTER — Encounter (HOSPITAL_COMMUNITY): Payer: Self-pay

## 2021-06-24 ENCOUNTER — Ambulatory Visit (HOSPITAL_COMMUNITY)
Admission: EM | Admit: 2021-06-24 | Discharge: 2021-06-24 | Disposition: A | Discharge status: Home / Self Care | Payer: 59 | Attending: Emergency Medicine | Admitting: Emergency Medicine

## 2021-06-24 DIAGNOSIS — Z3202 Encounter for pregnancy test, result negative: Secondary | ICD-10-CM

## 2021-06-24 DIAGNOSIS — Z113 Encounter for screening for infections with a predominantly sexual mode of transmission: Secondary | ICD-10-CM | POA: Diagnosis not present

## 2021-06-24 DIAGNOSIS — R350 Frequency of micturition: Secondary | ICD-10-CM | POA: Diagnosis not present

## 2021-06-24 LAB — POCT URINALYSIS DIPSTICK, ED / UC
Bilirubin Urine: NEGATIVE
Glucose, UA: NEGATIVE mg/dL
Ketones, ur: NEGATIVE mg/dL
Nitrite: NEGATIVE
Protein, ur: NEGATIVE mg/dL
Specific Gravity, Urine: 1.025 (ref 1.005–1.030)
Urobilinogen, UA: 0.2 mg/dL (ref 0.0–1.0)
pH: 6 (ref 5.0–8.0)

## 2021-06-24 LAB — POC URINE PREG, ED: Preg Test, Ur: NEGATIVE

## 2021-06-24 LAB — HIV ANTIBODY (ROUTINE TESTING W REFLEX): HIV Screen 4th Generation wRfx: NONREACTIVE

## 2021-06-24 MED ORDER — AZO BORIC ACID 600 MG VA SUPP: 1.0000 | Freq: Every day | VAGINAL | 0 refills | Status: AC

## 2021-06-24 NOTE — Discharge Instructions (Addendum)
Use the Boric Acid suppository vaginally for 7 days at the onset of symptoms to help treat BV.   You can also use the Boric Acid suppository vaginally 2-3 times a week to help prevent the development of BV.   We will contact you with the results from your lab work and any additional treatment.    Do not have sex while taking undergoing treatment for STI.  Make sure that all of your partners get tested and treated.   Use a condom or other barrier method for all sexual encounters.    Return or go to the Emergency Department if symptoms worsen or do not improve in the next few days.

## 2021-06-24 NOTE — ED Provider Notes (Signed)
[place]    CSN: [id number] Arrival date & time: 06/24/21  1002      History   Chief Complaint Chief Complaint  Patient presents with   Std testing   Urinary Frequency    HPI Virginia Bradley is a 35 y.o. female.   Patient here for STI testing and evaluation of urinary frequency.  Denies any urgency or dysuria.  Denies any vaginal discharge, pain, or irritation.  Reports having recurrent BV that is frequently treated with antibiotics.  Denies any trauma, injury, or other precipitating event.  Denies any specific alleviating or aggravating factors.  Denies any fevers, chest pain, shortness of breath, N/V/D, numbness, tingling, weakness, abdominal pain, or headaches.     The history is provided by the patient.  Urinary Frequency   Past Medical History:  Diagnosis Date   Headache    Seizures [facility])     Patient Active Problem List   Diagnosis Date Noted   Recurrent major depressive disorder, in partial remission ([place]) 05/19/2019   Localization-related idiopathic epilepsy and epileptic syndromes with seizures of localized onset, not intractable, without status epilepticus ([place]) 07/20/2018   Seizure disorder ([place]) 03/24/2016   Migraine 03/24/2016    Past Surgical History:  Procedure Laterality Date   APPENDECTOMY     ovary removed      OB History   No obstetric history on file.      Home Medications    Prior to Admission medications   Medication Sig Start Date End Date Taking? Authorizing Provider  Boric Acid Vaginal (AZO BORIC ACID) 600 MG SUPP Place 1 applicator vaginally daily for 7 days. 06/24/21 07/01/21 Yes Pearson Forster, NP  acetaminophen (TYLENOL) 500 MG tablet Take 1,000 mg by mouth daily as needed for mild pain.    [provider]  albuterol (VENTOLIN HFA) 108 (90 Base) MCG/ACT inhaler Inhale 1-2 puffs into the lungs every 6 (six) hours as needed for wheezing or shortness of breath. 05/20/21   Volney American, PA-C  metroNIDAZOLE  (FLAGYL) 500 MG tablet Take 1 tablet (500 mg total) by mouth 2 (two) times daily. 05/22/21   Lamptey, Myrene Galas, MD  montelukast (SINGULAIR) 10 MG tablet Take 1 tablet by mouth at bedtime as needed (allergies/breathing.). 03/21/21   [provider]  pantoprazole (PROTONIX) 40 MG tablet Take 1 tablet (40 mg total) by mouth daily. 05/25/21   Orpah Greek, MD  promethazine-dextromethorphan (PROMETHAZINE-DM) 6.25-15 MG/5ML syrup Take 5 mLs by mouth 4 (four) times daily as needed for cough. Patient not taking: No sig reported 05/20/21   Volney American, PA-C  sucralfate (CARAFATE) 1 GM/10ML suspension Take 10 mLs (1 g total) by mouth 4 (four) times daily -  with meals and at bedtime. 05/25/21   Orpah Greek, MD  zonisamide (ZONEGRAN) 100 MG capsule Take 4 capsules every night Patient not taking: No sig reported 06/01/19   Cameron Sprang, MD    Family History Family History  Problem Relation Age of Onset   Seizures Mother    Hypertension Maternal Grandmother     Social History Social History   Tobacco Use   Smoking status: Some Days    Packs/day: 0.50    Pack years: 0.00    Types: Cigarettes   Smokeless tobacco: Never  Vaping Use   Vaping Use: Never used  Substance Use Topics   Alcohol use: No    Alcohol/week: 0.0 standard drinks   Drug use: Yes    Types: Marijuana  Comment: Smokes marijuana three times monthly.     Allergies   Depakote er [divalproex sodium er], Valproic acid, and Phenytoin sodium extended   Review of Systems Review of Systems  Genitourinary:  Positive for frequency. Negative for dysuria, pelvic pain, urgency, vaginal discharge and vaginal pain.  All other systems reviewed and are negative.   Physical Exam Triage Vital Signs ED Triage Vitals  Enc Vitals Group     BP 06/24/21 1016 122/73     Pulse Rate 06/24/21 1016 (!) 114     Resp 06/24/21 1016 18     Temp 06/24/21 1016 99.6 F (37.6 C)     Temp Source 06/24/21 1016  Oral     SpO2 06/24/21 1016 97 %     Weight --      Height --      Head Circumference --      Peak Flow --      Pain Score 06/24/21 1014 0     Pain Loc --      Pain Edu? --      Excl. in [place]? --    No data found.  Updated Vital Signs BP 122/73 (BP Location: Right Arm)   Pulse (!) 114   Temp 99.6 F (37.6 C) (Oral)   Resp 18   LMP 06/02/2021 (Exact Date)   SpO2 97%   Visual Acuity Right Eye Distance:   Left Eye Distance:   Bilateral Distance:    Right Eye Near:   Left Eye Near:    Bilateral Near:     Physical Exam Vitals and nursing note reviewed.  Constitutional:      General: She is not in acute distress.    Appearance: Normal appearance. She is not ill-appearing, toxic-appearing or diaphoretic.  HENT:     Head: Normocephalic and atraumatic.  Eyes:     Conjunctiva/sclera: Conjunctivae normal.  Cardiovascular:     Rate and Rhythm: Normal rate.     Pulses: Normal pulses.  Pulmonary:     Effort: Pulmonary effort is normal.  Abdominal:     General: Abdomen is flat.  Genitourinary:    Comments: declines Musculoskeletal:        General: Normal range of motion.     Cervical back: Normal range of motion.  Skin:    General: Skin is warm and dry.  Neurological:     General: No focal deficit present.     Mental Status: She is alert and oriented to person, place, and time.  Psychiatric:        Mood and Affect: Mood normal.     UC Treatments / Results  Labs (all labs ordered are listed, but only abnormal results are displayed) Labs Reviewed  POCT URINALYSIS DIPSTICK, ED / UC - Abnormal; Notable for the following components:      Result Value   Hgb urine dipstick TRACE (*)    Leukocytes,Ua TRACE (*)    All other components within normal limits  URINE CULTURE  RPR  HIV ANTIBODY (ROUTINE TESTING W REFLEX)  POC URINE PREG, ED  CERVICOVAGINAL ANCILLARY ONLY    EKG   Radiology No results found.  Procedures Procedures (including critical care  time)  Medications Ordered in UC Medications - No data to display  Initial Impression / Assessment and Plan / UC Course  I have reviewed the triage vital signs and the nursing notes.  Pertinent labs & imaging results that were available during my care of the patient were reviewed by me  and considered in my medical decision making (see chart for details).    Assessment negative for red flags or concerns.  Urinalysis positive for hgb and leukocytes, urine culture pending.  Self swab obtained and will treat based on results.  RPR and HIV labs obtained.  Recommend boric acid suppository for BV symptoms and 2-3 times a week for prevention.  Discussed safe sex practices including condoms or other barrier method use.  Follow up with primary care as needed.  Final Clinical Impressions(s) / UC Diagnoses   Final diagnoses:  Screen for STD (sexually transmitted disease)  Negative pregnancy test  Urinary frequency     Discharge Instructions      Use the Boric Acid suppository vaginally for 7 days at the onset of symptoms to help treat BV.   You can also use the Boric Acid suppository vaginally 2-3 times a week to help prevent the development of BV.   We will contact you with the results from your lab work and any additional treatment.    Do not have sex while taking undergoing treatment for STI.  Make sure that all of your partners get tested and treated.   Use a condom or other barrier method for all sexual encounters.    Return or go to the Emergency Department if symptoms worsen or do not improve in the next few days.      ED Prescriptions     Medication Sig Dispense Auth. Provider   Boric Acid Vaginal (AZO BORIC ACID) 600 MG SUPP Place 1 applicator vaginally daily for 7 days. 14 suppository Pearson Forster, NP      PDMP not reviewed this encounter.   Pearson Forster, NP 06/24/21 1057

## 2021-06-24 NOTE — ED Triage Notes (Addendum)
Pt in for std testing  Denies any vaginal sx  Pt also c/o urinary frequency

## 2021-06-25 LAB — CERVICOVAGINAL ANCILLARY ONLY
Bacterial Vaginitis (gardnerella): POSITIVE — AB
Candida Glabrata: NEGATIVE
Candida Vaginitis: POSITIVE — AB
Chlamydia: NEGATIVE
Comment: NEGATIVE
Comment: NEGATIVE
Comment: NEGATIVE
Comment: NEGATIVE
Comment: NEGATIVE
Comment: NORMAL
Neisseria Gonorrhea: NEGATIVE
Trichomonas: NEGATIVE

## 2021-06-25 LAB — URINE CULTURE

## 2021-06-25 LAB — RPR: RPR Ser Ql: NONREACTIVE

## 2021-06-26 ENCOUNTER — Telehealth (HOSPITAL_COMMUNITY): Payer: Self-pay | Admitting: Emergency Medicine

## 2021-06-26 MED ORDER — METRONIDAZOLE 0.75 % VA GEL: 1.0000 | Freq: Every day | VAGINAL | 0 refills | Status: AC

## 2021-06-26 MED ORDER — FLUCONAZOLE 150 MG PO TABS: 150.0000 mg | ORAL_TABLET | Freq: Once | ORAL | 0 refills | Status: AC

## 2021-07-05 ENCOUNTER — Ambulatory Visit: Payer: 59 | Admitting: Gastroenterology

## 2021-07-09 ENCOUNTER — Other Ambulatory Visit: Payer: Self-pay

## 2021-07-09 ENCOUNTER — Ambulatory Visit (HOSPITAL_COMMUNITY)
Admission: EM | Admit: 2021-07-09 | Discharge: 2021-07-09 | Disposition: A | Discharge status: Home / Self Care | Payer: 59 | Attending: Internal Medicine | Admitting: Internal Medicine

## 2021-07-09 ENCOUNTER — Encounter (HOSPITAL_COMMUNITY): Payer: Self-pay

## 2021-07-09 DIAGNOSIS — M545 Low back pain, unspecified: Secondary | ICD-10-CM

## 2021-07-09 MED ORDER — TIZANIDINE HCL 4 MG PO TABS: 4.0000 mg | ORAL_TABLET | Freq: Three times a day (TID) | ORAL | 0 refills | Status: DC | PRN

## 2021-07-09 MED ORDER — KETOROLAC TROMETHAMINE 30 MG/ML IJ SOLN
INTRAMUSCULAR | Status: AC
  Filled 2021-07-09: qty 1

## 2021-07-09 MED ORDER — IBUPROFEN 600 MG PO TABS: 600.0000 mg | ORAL_TABLET | Freq: Three times a day (TID) | ORAL | 0 refills | Status: DC | PRN

## 2021-07-09 MED ORDER — KETOROLAC TROMETHAMINE 30 MG/ML IJ SOLN
30.0000 mg | Freq: Once | INTRAMUSCULAR | Status: AC
  Administered 2021-07-09: 30 mg via INTRAMUSCULAR

## 2021-07-09 NOTE — ED Triage Notes (Signed)
Pt c/o medial lower back pain Started: Sunday Interventions:motrin, aleve with no relief

## 2021-07-09 NOTE — Discharge Instructions (Addendum)
Gentle range of motion exercises Heat therapy 15 to 30 minutes on-30 minutes off x4 cycles twice daily Back strengthening exercises Return to urgent care if symptoms worsen.

## 2021-07-10 NOTE — ED Provider Notes (Signed)
[place]    CSN: [id number] Arrival date & time: 07/09/21  1650      History   Chief Complaint Chief Complaint  Patient presents with   Back Pain    HPI Virginia Bradley is a 35 y.o. female comes to the urgent care with a 1 day history of low back pain.  Pain is sharp in nature.  Pain is severe, currently 8 out of 10.  It is aggravated by movement and relieved by sitting still.  Patient denies any trauma or falls.  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  Pain does not radiate into the feet but appears to radiate into both buttocks.  No numbness or tingling in the lower extremities.  No urinary or bowel problems. HPI  Past Medical History:  Diagnosis Date   Headache    Seizures [facility])     Patient Active Problem List   Diagnosis Date Noted   Recurrent major depressive disorder, in partial remission ([place]) 05/19/2019   Localization-related idiopathic epilepsy and epileptic syndromes with seizures of localized onset, not intractable, without status epilepticus ([place]) 07/20/2018   Seizure disorder ([place]) 03/24/2016   Migraine 03/24/2016    Past Surgical History:  Procedure Laterality Date   APPENDECTOMY     ovary removed      OB History   No obstetric history on file.      Home Medications    Prior to Admission medications   Medication Sig Start Date End Date Taking? Authorizing Provider  ibuprofen (ADVIL) 600 MG tablet Take 1 tablet (600 mg total) by mouth every 8 (eight) hours as needed. 07/09/21  Yes Kelby Adell, Myrene Galas, MD  tiZANidine (ZANAFLEX) 4 MG tablet Take 1 tablet (4 mg total) by mouth every 8 (eight) hours as needed for muscle spasms. 07/09/21  Yes Darryon Bastin, Myrene Galas, MD  acetaminophen (TYLENOL) 500 MG tablet Take 1,000 mg by mouth daily as needed for mild pain.    [provider]  albuterol (VENTOLIN HFA) 108 (90 Base) MCG/ACT inhaler Inhale 1-2 puffs into the lungs every 6 (six) hours as needed for wheezing or shortness of breath.  05/20/21   Volney American, PA-C  montelukast (SINGULAIR) 10 MG tablet Take 1 tablet by mouth at bedtime as needed (allergies/breathing.). 03/21/21   [provider]  pantoprazole (PROTONIX) 40 MG tablet Take 1 tablet (40 mg total) by mouth daily. 05/25/21   Orpah Greek, MD  sucralfate (CARAFATE) 1 GM/10ML suspension Take 10 mLs (1 g total) by mouth 4 (four) times daily -  with meals and at bedtime. 05/25/21   Orpah Greek, MD    Family History Family History  Problem Relation Age of Onset   Seizures Mother    Hypertension Maternal Grandmother     Social History Social History   Tobacco Use   Smoking status: Some Days    Packs/day: 0.50    Types: Cigarettes   Smokeless tobacco: Never  Vaping Use   Vaping Use: Never used  Substance Use Topics   Alcohol use: No    Alcohol/week: 0.0 standard drinks   Drug use: Yes    Types: Marijuana    Comment: Smokes marijuana three times monthly.     Allergies   Depakote er [divalproex sodium er], Valproic acid, and Phenytoin sodium extended   Review of Systems Review of Systems  Constitutional: Negative.   Genitourinary: Negative.   Musculoskeletal:  Positive for back pain and myalgias. Negative for arthralgias,  neck pain and neck stiffness.  Skin: Negative.   Neurological: Negative.     Physical Exam Triage Vital Signs ED Triage Vitals  Enc Vitals Group     BP 07/09/21 1810 122/86     Pulse Rate 07/09/21 1810 87     Resp 07/09/21 1810 18     Temp 07/09/21 1810 98.2 F (36.8 C)     Temp Source 07/09/21 1810 Oral     SpO2 07/09/21 1810 100 %     Weight --      Height --      Head Circumference --      Peak Flow --      Pain Score 07/09/21 1812 10     Pain Loc --      Pain Edu? --      Excl. in [place]? --    No data found.  Updated Vital Signs BP 122/86   Pulse 87   Temp 98.2 F (36.8 C) (Oral)   Resp 18   LMP 06/28/2021   SpO2 100%   Visual Acuity Right Eye Distance:   Left Eye  Distance:   Bilateral Distance:    Right Eye Near:   Left Eye Near:    Bilateral Near:     Physical Exam Vitals and nursing note reviewed.  Constitutional:      General: She is not in acute distress.    Appearance: She is not ill-appearing.  Cardiovascular:     Rate and Rhythm: Normal rate and regular rhythm.  Musculoskeletal:        General: Tenderness present. Normal range of motion.     Comments: Tenderness on palpation over the sacroiliac joints bilaterally.  Full range of motion around the lumbosacral spine.  Straight leg raise test is negative  Neurological:     Mental Status: She is alert.     UC Treatments / Results  Labs (all labs ordered are listed, but only abnormal results are displayed) Labs Reviewed - No data to display  EKG   Radiology No results found.  Procedures Procedures (including critical care time)  Medications Ordered in UC Medications  ketorolac (TORADOL) 30 MG/ML injection 30 mg (30 mg Intramuscular Given 07/09/21 1855)    Initial Impression / Assessment and Plan / UC Course  I have reviewed the triage vital signs and the nursing notes.  Pertinent labs & imaging results that were available during my care of the patient were reviewed by me and considered in my medical decision making (see chart for details).     1.  Low back pain: Toradol 30 mg IM x1 dose Ibuprofen 600 mg every 6 hours as needed for pain Tizanidine 4 mg at bedtime as needed for back stiffness or muscle spasms Patient is advised to return to urgent care if symptoms does not improve No indication for imaging at this time. Final Clinical Impressions(s) / UC Diagnoses   Final diagnoses:  Acute bilateral low back pain without sciatica     Discharge Instructions      Gentle range of motion exercises Heat therapy 15 to 30 minutes on-30 minutes off x4 cycles twice daily Back strengthening exercises Return to urgent care if symptoms worsen.   ED Prescriptions      Medication Sig Dispense Auth. Provider   ibuprofen (ADVIL) 600 MG tablet Take 1 tablet (600 mg total) by mouth every 8 (eight) hours as needed. 30 tablet Lindzy Rupert, Myrene Galas, MD   tiZANidine (ZANAFLEX) 4 MG tablet Take 1 tablet (  4 mg total) by mouth every 8 (eight) hours as needed for muscle spasms. 15 tablet Linder Prajapati, Myrene Galas, MD      PDMP not reviewed this encounter.   Chase Picket, MD 07/10/21 [id number]

## 2021-09-17 ENCOUNTER — Ambulatory Visit (HOSPITAL_COMMUNITY)
Admission: EM | Admit: 2021-09-17 | Discharge: 2021-09-17 | Disposition: A | Discharge status: Home / Self Care | Payer: 59

## 2021-09-17 ENCOUNTER — Other Ambulatory Visit: Payer: Self-pay

## 2021-09-17 ENCOUNTER — Encounter (HOSPITAL_COMMUNITY): Payer: Self-pay

## 2021-09-17 DIAGNOSIS — U071 COVID-19: Secondary | ICD-10-CM

## 2021-09-17 DIAGNOSIS — M791 Myalgia, unspecified site: Secondary | ICD-10-CM | POA: Diagnosis not present

## 2021-09-17 NOTE — ED Provider Notes (Signed)
[place]    CSN: [id number] Arrival date & time: 09/17/21  1836      History   Chief Complaint Chief Complaint  Patient presents with   Covid +    HPI Virginia Bradley is a 35 y.o. female.   Pt reports tested positive for COVID yesterday on a home test.  She reports body aches, fatigue, sore throat started 09/14/2021. Reports symptoms have improved at this time.  Pt recently went on a cruise in the [place]. She was taking Dayquil and ibuprofen with some relief.  She needs a note for work.    Past Medical History:  Diagnosis Date   Headache    Seizures [facility])     Patient Active Problem List   Diagnosis Date Noted   Recurrent major depressive disorder, in partial remission ([place]) 05/19/2019   Localization-related idiopathic epilepsy and epileptic syndromes with seizures of localized onset, not intractable, without status epilepticus ([place]) 07/20/2018   Seizure disorder ([place]) 03/24/2016   Migraine 03/24/2016    Past Surgical History:  Procedure Laterality Date   APPENDECTOMY     ovary removed      OB History   No obstetric history on file.      Home Medications    Prior to Admission medications   Medication Sig Start Date End Date Taking? Authorizing Provider  acetaminophen (TYLENOL) 500 MG tablet Take 1,000 mg by mouth daily as needed for mild pain.    [provider]  albuterol (VENTOLIN HFA) 108 (90 Base) MCG/ACT inhaler Inhale 1-2 puffs into the lungs every 6 (six) hours as needed for wheezing or shortness of breath. 05/20/21   Volney American, PA-C  ibuprofen (ADVIL) 600 MG tablet Take 1 tablet (600 mg total) by mouth every 8 (eight) hours as needed. 07/09/21   Lamptey, Myrene Galas, MD  montelukast (SINGULAIR) 10 MG tablet Take 1 tablet by mouth at bedtime as needed (allergies/breathing.). 03/21/21   [provider]  pantoprazole (PROTONIX) 40 MG tablet Take 1 tablet (40 mg total) by mouth daily. 05/25/21   Orpah Greek, MD   sucralfate (CARAFATE) 1 GM/10ML suspension Take 10 mLs (1 g total) by mouth 4 (four) times daily -  with meals and at bedtime. 05/25/21   Orpah Greek, MD  tiZANidine (ZANAFLEX) 4 MG tablet Take 1 tablet (4 mg total) by mouth every 8 (eight) hours as needed for muscle spasms. 07/09/21   LampteyMyrene Galas, MD    Family History Family History  Problem Relation Age of Onset   Seizures Mother    Hypertension Maternal Grandmother     Social History Social History   Tobacco Use   Smoking status: Some Days    Packs/day: 0.50    Types: Cigarettes   Smokeless tobacco: Never  Vaping Use   Vaping Use: Never used  Substance Use Topics   Alcohol use: No    Alcohol/week: 0.0 standard drinks   Drug use: Yes    Types: Marijuana    Comment: Smokes marijuana three times monthly.     Allergies   Depakote er [divalproex sodium er], Valproic acid, and Phenytoin sodium extended   Review of Systems Review of Systems  Constitutional:  Positive for chills and fatigue. Negative for fever.  HENT:  Positive for congestion. Negative for ear pain and sore throat.   Eyes:  Negative for pain and visual disturbance.  Respiratory:  Negative for cough and shortness of breath.   Cardiovascular:  Negative for chest pain  and palpitations.  Gastrointestinal:  Negative for abdominal pain and vomiting.  Genitourinary:  Negative for dysuria and hematuria.  Musculoskeletal:  Positive for myalgias. Negative for arthralgias and back pain.  Skin:  Negative for color change and rash.  Neurological:  Negative for seizures and syncope.  All other systems reviewed and are negative.   Physical Exam Triage Vital Signs ED Triage Vitals  Enc Vitals Group     BP 09/17/21 1934 117/81     Pulse Rate 09/17/21 1934 88     Resp 09/17/21 1934 17     Temp 09/17/21 1934 99.3 F (37.4 C)     Temp Source 09/17/21 1934 Oral     SpO2 09/17/21 1934 100 %     Weight --      Height --      Head Circumference --       Peak Flow --      Pain Score 09/17/21 1933 5     Pain Loc --      Pain Edu? --      Excl. in [place]? --    No data found.  Updated Vital Signs BP 117/81 (BP Location: Right Arm)   Pulse 88   Temp 99.3 F (37.4 C) (Oral)   Resp 17   LMP 07/28/2021   SpO2 100%   Visual Acuity Right Eye Distance:   Left Eye Distance:   Bilateral Distance:    Right Eye Near:   Left Eye Near:    Bilateral Near:     Physical Exam Vitals and nursing note reviewed.  Constitutional:      General: She is not in acute distress.    Appearance: She is well-developed.  HENT:     Head: Normocephalic and atraumatic.  Eyes:     Conjunctiva/sclera: Conjunctivae normal.  Cardiovascular:     Rate and Rhythm: Normal rate and regular rhythm.     Heart sounds: No murmur heard. Pulmonary:     Effort: Pulmonary effort is normal. No respiratory distress.     Breath sounds: Normal breath sounds.  Abdominal:     Palpations: Abdomen is soft.     Tenderness: There is no abdominal tenderness.  Musculoskeletal:     Cervical back: Neck supple.  Skin:    General: Skin is warm and dry.  Neurological:     Mental Status: She is alert.     UC Treatments / Results  Labs (all labs ordered are listed, but only abnormal results are displayed) Labs Reviewed - No data to display  EKG   Radiology No results found.  Procedures Procedures (including critical care time)  Medications Ordered in UC Medications - No data to display  Initial Impression / Assessment and Plan / UC Course  I have reviewed the triage vital signs and the nursing notes.  Pertinent labs & imaging results that were available during my care of the patient were reviewed by me and considered in my medical decision making (see chart for details).     Pt tested positive for COVID on home test.  At this time symptoms have improved.  Pt well appearing, vitals wnl.  Work note given. Isolation precautions discussed.  Final Clinical  Impressions(s) / UC Diagnoses   Final diagnoses:  None   Discharge Instructions   None    ED Prescriptions   None    PDMP not reviewed this encounter.   Ward, Lenise Arena, PA-C 09/17/21 1941

## 2021-09-17 NOTE — Discharge Instructions (Addendum)
Isolate for 5 days from symptoms onset, after that wear a mask around others for 5 days.   Continue with supportive care at home.

## 2021-09-17 NOTE — ED Triage Notes (Signed)
Pt presents covid positive as of at home test last night; needs a note for work to [employer].

## 2021-11-18 ENCOUNTER — Other Ambulatory Visit: Payer: Self-pay

## 2021-11-18 ENCOUNTER — Encounter (HOSPITAL_COMMUNITY): Payer: Self-pay | Admitting: *Deleted

## 2021-11-18 ENCOUNTER — Ambulatory Visit (HOSPITAL_COMMUNITY)
Admission: EM | Admit: 2021-11-18 | Discharge: 2021-11-18 | Disposition: A | Discharge status: Home / Self Care | Payer: 59 | Attending: Student | Admitting: Student

## 2021-11-18 DIAGNOSIS — N939 Abnormal uterine and vaginal bleeding, unspecified: Secondary | ICD-10-CM | POA: Diagnosis not present

## 2021-11-18 LAB — POC URINE PREG, ED: Preg Test, Ur: NEGATIVE

## 2021-11-18 MED ORDER — NAPROXEN 500 MG PO TABS: 500.0000 mg | ORAL_TABLET | Freq: Two times a day (BID) | ORAL | 0 refills | Status: AC

## 2021-11-18 MED ORDER — ONDANSETRON 8 MG PO TBDP: 8.0000 mg | ORAL_TABLET | Freq: Three times a day (TID) | ORAL | 0 refills | Status: DC | PRN

## 2021-11-18 NOTE — ED Triage Notes (Signed)
Pt reports being on her menses with bad cramps and heavy vag blood flow . Pt reports seeing blood clots.

## 2021-11-18 NOTE — Discharge Instructions (Addendum)
-  Take the Zofran (ondansetron) up to 3 times daily for nausea and vomiting. Dissolve one pill under your tongue or between your teeth and your cheek. -Naproxen up to twice a day. You can also take tylenol 1000mg  up to 3x daily. Avoid other NSAIDs like ibuprofen while taking naproxen. Take naproxen with food.  -Follow-up with your PCP in 2-3 days. They can hopefully start you on birth control and check your iron levels -Head to ED if worsening abd pain, worsening bleeding, dizziness, etc.

## 2021-11-18 NOTE — ED Provider Notes (Signed)
[place]    CSN: [id number] Arrival date & time: 11/18/21  1507      History   Chief Complaint Chief Complaint  Patient presents with   Abdominal Cramping   heavy vag blood flow with clots     HPI Virginia Bradley is a 35 y.o. female presenting with abnormal uterine bleeding.  Medical history similar issues in the past, but this is the worst episode.  Describes 2 days of heavy bleeding with dime size clots.  Has used about 11 pads today.  States that she first saw her primary care, who advised her to go to the [facility], she was turned away from the [facility] as she is not pregnant, and she called her gynecologist who cannot see her until February and so she presents to the urgent care.  Describes crampy lower abdominal pain.  States she became very concerned that the blood clots.  Denies dizziness, weakness, flank pain, fever/chills.  Denies vaginal discharge before her symptoms started.  Denies urinary symptoms.  HPI  Past Medical History:  Diagnosis Date   Headache    Seizures [facility])     Patient Active Problem List   Diagnosis Date Noted   Recurrent major depressive disorder, in partial remission ([place]) 05/19/2019   Localization-related idiopathic epilepsy and epileptic syndromes with seizures of localized onset, not intractable, without status epilepticus ([place]) 07/20/2018   Seizure disorder ([place]) 03/24/2016   Migraine 03/24/2016    Past Surgical History:  Procedure Laterality Date   APPENDECTOMY     ovary removed      OB History   No obstetric history on file.      Home Medications    Prior to Admission medications   Medication Sig Start Date End Date Taking? Authorizing Provider  naproxen (NAPROSYN) 500 MG tablet Take 1 tablet (500 mg total) by mouth 2 (two) times daily for 5 days. 11/18/21 11/23/21 Yes Hazel Sams, PA-C  ondansetron (ZOFRAN-ODT) 8 MG disintegrating tablet Take 1 tablet (8 mg total) by mouth every 8 (eight) hours  as needed for nausea or vomiting. 11/18/21  Yes Hazel Sams, PA-C  acetaminophen (TYLENOL) 500 MG tablet Take 1,000 mg by mouth daily as needed for mild pain.    [provider]  albuterol (VENTOLIN HFA) 108 (90 Base) MCG/ACT inhaler Inhale 1-2 puffs into the lungs every 6 (six) hours as needed for wheezing or shortness of breath. 05/20/21   Volney American, PA-C  ibuprofen (ADVIL) 600 MG tablet Take 1 tablet (600 mg total) by mouth every 8 (eight) hours as needed. 07/09/21   Lamptey, Myrene Galas, MD  montelukast (SINGULAIR) 10 MG tablet Take 1 tablet by mouth at bedtime as needed (allergies/breathing.). 03/21/21   [provider]  pantoprazole (PROTONIX) 40 MG tablet Take 1 tablet (40 mg total) by mouth daily. 05/25/21   Orpah Greek, MD    Family History Family History  Problem Relation Age of Onset   Seizures Mother    Hypertension Maternal Grandmother     Social History Social History   Tobacco Use   Smoking status: Some Days    Packs/day: 0.50    Types: Cigarettes   Smokeless tobacco: Never  Vaping Use   Vaping Use: Never used  Substance Use Topics   Alcohol use: No    Alcohol/week: 0.0 standard drinks   Drug use: Yes    Types: Marijuana    Comment: Smokes marijuana three times monthly.     Allergies  Depakote er [divalproex sodium er], Valproic acid, and Phenytoin sodium extended   Review of Systems Review of Systems  Constitutional:  Negative for chills and fever.  HENT:  Negative for sore throat.   Eyes:  Negative for pain and redness.  Respiratory:  Negative for shortness of breath.   Cardiovascular:  Negative for chest pain.  Gastrointestinal:  Negative for abdominal pain, diarrhea, nausea and vomiting.  Genitourinary:  Positive for menstrual problem. Negative for decreased urine volume, difficulty urinating, dysuria, flank pain, frequency, genital sores, hematuria and urgency.  Musculoskeletal:  Negative for back pain.  Skin:   Negative for rash.  All other systems reviewed and are negative.   Physical Exam Triage Vital Signs ED Triage Vitals  Enc Vitals Group     BP 11/18/21 1655 121/81     Pulse Rate 11/18/21 1655 77     Resp 11/18/21 1655 18     Temp 11/18/21 1655 99.2 F (37.3 C)     Temp src --      SpO2 11/18/21 1655 98 %     Weight --      Height --      Head Circumference --      Peak Flow --      Pain Score 11/18/21 1654 10     Pain Loc --      Pain Edu? --      Excl. in [place]? --    No data found.  Updated Vital Signs BP 121/81   Pulse 77   Temp 99.2 F (37.3 C)   Resp 18   LMP 11/18/2021   SpO2 98%   Visual Acuity Right Eye Distance:   Left Eye Distance:   Bilateral Distance:    Right Eye Near:   Left Eye Near:    Bilateral Near:     Physical Exam Vitals reviewed.  Constitutional:      General: She is not in acute distress.    Appearance: Normal appearance. She is not ill-appearing.  HENT:     Head: Normocephalic and atraumatic.     Mouth/Throat:     Mouth: Mucous membranes are moist.     Comments: Moist mucous membranes Eyes:     Extraocular Movements: Extraocular movements intact.     Pupils: Pupils are equal, round, and reactive to light.  Cardiovascular:     Rate and Rhythm: Normal rate and regular rhythm.     Heart sounds: Normal heart sounds.  Pulmonary:     Effort: Pulmonary effort is normal.     Breath sounds: Normal breath sounds. No wheezing, rhonchi or rales.  Abdominal:     General: Bowel sounds are normal. There is no distension.     Palpations: Abdomen is soft. There is no mass.     Tenderness: There is abdominal tenderness in the right lower quadrant and left lower quadrant. There is no right CVA tenderness, left CVA tenderness, guarding or rebound. Negative signs include Murphy's sign, Rovsing's sign and McBurney's sign.     Comments: Some lower abd tenderness to palpation without guarding or rebound. Comfortable throughout exam .  Skin:     General: Skin is warm.     Capillary Refill: Capillary refill takes less than 2 seconds.     Comments: Good skin turgor  Neurological:     General: No focal deficit present.     Mental Status: She is alert and oriented to person, place, and time.  Psychiatric:  Mood and Affect: Mood normal.        Behavior: Behavior normal.     UC Treatments / Results  Labs (all labs ordered are listed, but only abnormal results are displayed) Labs Reviewed  POC URINE PREG, ED    EKG   Radiology No results found.  Procedures Procedures (including critical care time)  Medications Ordered in UC Medications - No data to display  Initial Impression / Assessment and Plan / UC Course  I have reviewed the triage vital signs and the nursing notes.  Pertinent labs & imaging results that were available during my care of the patient were reviewed by me and considered in my medical decision making (see chart for details).     This patient is a very pleasant 35 y.o. year old female presenting with AUB x2 days.   U-preg negative.   Given two days of symptoms, I do not recommend Megace at this time. Trial of naproxen and zofran ODT prn. F/u with PCP in 2-3 days for recheck and CBC. Could discuss OCP initiation at that time. Also rec f/u with gyn at their earliest convenience.   ED return precautions discussed. Patient verbalizes understanding and agreement.    Final Clinical Impressions(s) / UC Diagnoses   Final diagnoses:  Abnormal uterine bleeding (AUB)     Discharge Instructions      -Take the Zofran (ondansetron) up to 3 times daily for nausea and vomiting. Dissolve one pill under your tongue or between your teeth and your cheek. -Naproxen up to twice a day. You can also take tylenol 1000mg  up to 3x daily. Avoid other NSAIDs like ibuprofen while taking naproxen. Take naproxen with food.  -Follow-up with your PCP in 2-3 days. They can hopefully start you on birth control and check  your iron levels -Head to ED if worsening abd pain, worsening bleeding, dizziness, etc.     ED Prescriptions     Medication Sig Dispense Auth. Provider   naproxen (NAPROSYN) 500 MG tablet Take 1 tablet (500 mg total) by mouth 2 (two) times daily for 5 days. 10 tablet Hazel Sams, PA-C   ondansetron (ZOFRAN-ODT) 8 MG disintegrating tablet Take 1 tablet (8 mg total) by mouth every 8 (eight) hours as needed for nausea or vomiting. 20 tablet Hazel Sams, PA-C      PDMP not reviewed this encounter.   Hazel Sams, PA-C 11/18/21 1850

## 2022-01-09 ENCOUNTER — Ambulatory Visit (HOSPITAL_COMMUNITY)
Admission: EM | Admit: 2022-01-09 | Discharge: 2022-01-09 | Disposition: A | Discharge status: Home / Self Care | Payer: Commercial Managed Care - HMO | Attending: Family Medicine | Admitting: Family Medicine

## 2022-01-09 ENCOUNTER — Encounter (HOSPITAL_COMMUNITY): Payer: Self-pay | Admitting: Emergency Medicine

## 2022-01-09 ENCOUNTER — Other Ambulatory Visit: Payer: Self-pay

## 2022-01-09 DIAGNOSIS — J069 Acute upper respiratory infection, unspecified: Secondary | ICD-10-CM | POA: Diagnosis not present

## 2022-01-09 DIAGNOSIS — J029 Acute pharyngitis, unspecified: Secondary | ICD-10-CM | POA: Insufficient documentation

## 2022-01-09 LAB — POC INFLUENZA A AND B ANTIGEN (URGENT CARE ONLY)
INFLUENZA A ANTIGEN, POC: NEGATIVE
INFLUENZA B ANTIGEN, POC: NEGATIVE

## 2022-01-09 LAB — POCT RAPID STREP A, ED / UC: Streptococcus, Group A Screen (Direct): NEGATIVE

## 2022-01-09 NOTE — ED Triage Notes (Signed)
Onset of symptoms Tuesday, 01/07/2022.  Patient has a sore throat, cough, chest soreness with coughing, runny nose and headache.  Coughing up thick yellow per patient

## 2022-01-09 NOTE — ED Provider Notes (Signed)
[place]    CSN: [id number] Arrival date & time: 01/09/22  0805      History   Chief Complaint Chief Complaint  Patient presents with   Sore Throat    HPI Virginia Bradley is a 36 y.o. female.   Patient is here for uri symptoms.  Started 2 days ago with runny nose, congestion.  Having sore throat, headache, cough, yellow phlegm.  No fevers/chills.  Mild sob, no wheezing.  Took dayquil without help.  She works in [employer].    Past Medical History:  Diagnosis Date   Headache    Seizures [facility])     Patient Active Problem List   Diagnosis Date Noted   Recurrent major depressive disorder, in partial remission ([place]) 05/19/2019   Localization-related idiopathic epilepsy and epileptic syndromes with seizures of localized onset, not intractable, without status epilepticus ([place]) 07/20/2018   Seizure disorder ([place]) 03/24/2016   Migraine 03/24/2016    Past Surgical History:  Procedure Laterality Date   APPENDECTOMY     ovary removed      OB History   No obstetric history on file.      Home Medications    Prior to Admission medications   Medication Sig Start Date End Date Taking? Authorizing Provider  acetaminophen (TYLENOL) 500 MG tablet Take 1,000 mg by mouth daily as needed for mild pain.    [provider]  albuterol (VENTOLIN HFA) 108 (90 Base) MCG/ACT inhaler Inhale 1-2 puffs into the lungs every 6 (six) hours as needed for wheezing or shortness of breath. Patient not taking: Reported on 01/09/2022 05/20/21   Volney American, PA-C  ALPRAZolam Duanne Moron) 0.5 MG tablet alprazolam 0.5 mg tablet  TAKE 1 TABLET BY MOUTH THREE TIMES DAILY    [provider]  ibuprofen (ADVIL) 600 MG tablet Take 1 tablet (600 mg total) by mouth every 8 (eight) hours as needed. 07/09/21   Lamptey, Myrene Galas, MD  montelukast (SINGULAIR) 10 MG tablet Take 1 tablet by mouth at bedtime as needed (allergies/breathing.). Patient not taking: Reported on 01/09/2022  03/21/21   [provider]  ondansetron (ZOFRAN-ODT) 8 MG disintegrating tablet Take 1 tablet (8 mg total) by mouth every 8 (eight) hours as needed for nausea or vomiting. Patient not taking: Reported on 01/09/2022 11/18/21   Hazel Sams, PA-C  pantoprazole (PROTONIX) 40 MG tablet Take 1 tablet (40 mg total) by mouth daily. Patient not taking: Reported on 01/09/2022 05/25/21   Orpah Greek, MD    Family History Family History  Problem Relation Age of Onset   Seizures Mother    Hypertension Maternal Grandmother     Social History Social History   Tobacco Use   Smoking status: Former    Packs/day: 0.50    Types: Cigarettes   Smokeless tobacco: Never  Vaping Use   Vaping Use: Never used  Substance Use Topics   Alcohol use: No    Alcohol/week: 0.0 standard drinks   Drug use: Yes    Types: Marijuana    Comment: Smokes marijuana three times monthly.     Allergies   Depakote er [divalproex sodium er], Valproic acid, and Phenytoin sodium extended   Review of Systems Review of Systems  Constitutional:  Positive for fatigue. Negative for chills and fever.  HENT:  Positive for congestion, rhinorrhea and sore throat. Negative for ear pain.   Respiratory:  Positive for cough and chest tightness. Negative for wheezing.   Cardiovascular: Negative.   Gastrointestinal: Negative.  Physical Exam Triage Vital Signs ED Triage Vitals  Enc Vitals Group     BP 01/09/22 0831 104/70     Pulse Rate 01/09/22 0831 98     Resp 01/09/22 0831 20     Temp 01/09/22 0831 98.3 F (36.8 C)     Temp Source 01/09/22 0831 Oral     SpO2 01/09/22 0831 97 %     Weight --      Height --      Head Circumference --      Peak Flow --      Pain Score 01/09/22 0827 8     Pain Loc --      Pain Edu? --      Excl. in [place]? --    No data found.  Updated Vital Signs BP 104/70 (BP Location: Right Arm)    Pulse 98    Temp 98.3 F (36.8 C) (Oral)    Resp 20 Comment: sniffling/runny  nose   LMP 12/15/2021    SpO2 97%   Visual Acuity Right Eye Distance:   Left Eye Distance:   Bilateral Distance:    Right Eye Near:   Left Eye Near:    Bilateral Near:     Physical Exam Constitutional:      Appearance: She is well-developed.  HENT:     Head: Normocephalic and atraumatic.     Right Ear: Tympanic membrane normal.     Left Ear: Tympanic membrane normal.     Nose: Congestion and rhinorrhea present.     Mouth/Throat:     Mouth: Mucous membranes are moist.     Pharynx: Posterior oropharyngeal erythema present. No pharyngeal swelling or oropharyngeal exudate.  Cardiovascular:     Rate and Rhythm: Normal rate and regular rhythm.     Heart sounds: Normal heart sounds.  Pulmonary:     Effort: Pulmonary effort is normal.     Breath sounds: Normal breath sounds.  Musculoskeletal:     Cervical back: Normal range of motion and neck supple.  Lymphadenopathy:     Cervical: Cervical adenopathy present.  Neurological:     Mental Status: She is alert.     UC Treatments / Results  Labs (all labs ordered are listed, but only abnormal results are displayed) Labs Reviewed  CULTURE, GROUP A STREP [facility])  POCT RAPID STREP A, ED / UC  POC INFLUENZA A AND B ANTIGEN (URGENT CARE ONLY)    EKG   Radiology No results found.  Procedures Procedures (including critical care time)  Medications Ordered in UC Medications - No data to display  Initial Impression / Assessment and Plan / UC Course  I have reviewed the triage vital signs and the nursing notes.  Pertinent labs & imaging results that were available during my care of the patient were reviewed by me and considered in my medical decision making (see chart for details).   Patient seen for uri symptoms.  Flu and strep negative.  She declines covid testing.  Supportive care at this time.  Follow up with pcp if not improving.   Final Clinical Impressions(s) / UC Diagnoses   Final diagnoses:  Pharyngitis,  unspecified etiology  Viral URI with cough     Discharge Instructions      You were seen today for upper respiratory symptoms.  Your strep test was negative.  This will be sent for culture.  Your flu swab was negative.  You have declined a covid test today.  At this time  I recommend tylenol/motrin to help with pain, as well as salt water gargles to help with your sore throat.  I recommend over the counter sudafed and mucinex to help with your symptoms.   Please get plenty of rest and fluids as well.      ED Prescriptions   None    PDMP not reviewed this encounter.   Rondel Oh, MD 01/09/22 [phone]

## 2022-01-09 NOTE — Discharge Instructions (Addendum)
You were seen today for upper respiratory symptoms.  Your strep test was negative.  This will be sent for culture.  Your flu swab was negative.  You have declined a covid test today.  At this time I recommend tylenol/motrin to help with pain, as well as salt water gargles to help with your sore throat.  I recommend over the counter sudafed and mucinex to help with your symptoms.   Please get plenty of rest and fluids as well.

## 2022-01-11 LAB — CULTURE, GROUP A STREP (THRC)

## 2022-01-22 IMAGING — CT CT CHEST W/O CM
1 series · 14 of 34 positions shown, 18 images · non-contrast
Comparison: Contemporary CT of the thoracic spine. Chest radiograph
10/03/2020

CLINICAL DATA: Pain in the mid back and ribs began 1 year prior
marked with vitamin-E tablets

EXAM:
CT CHEST WITHOUT CONTRAST
TECHNIQUE: Multidetector CT imaging of the chest was performed following the
standard protocol without IV contrast.

[Series 2: chest w/(date) · axial · 0.78mm/px · z∈[-324,-74]mm · 14 of 147 slices shown, 18 images]
[im 11/147  mediastinal]
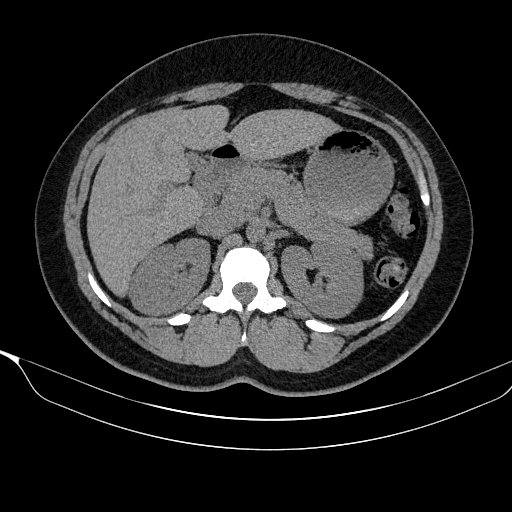
[im 11/147  lung]
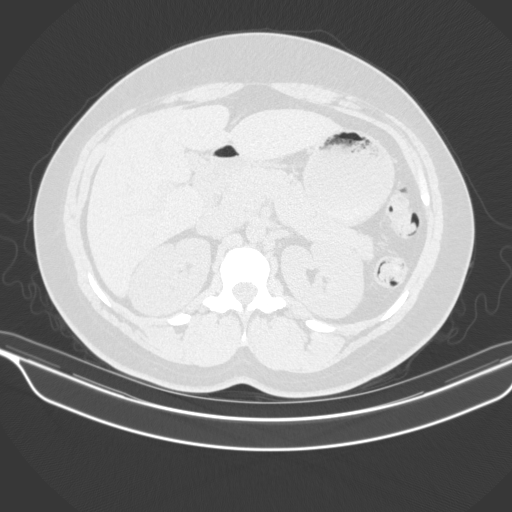
[im 22/147  lung]
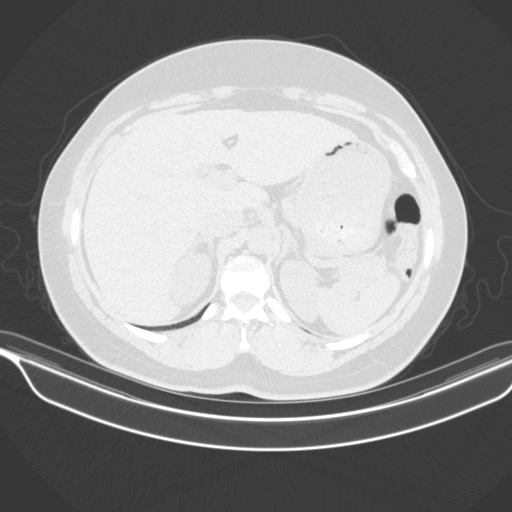
[im 30/147  lung]
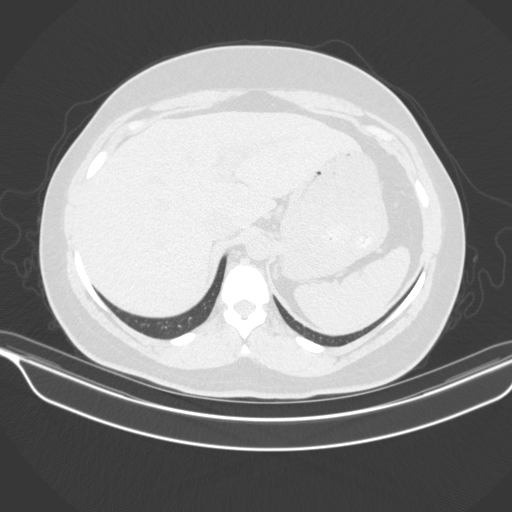
[im 44/147  lung]
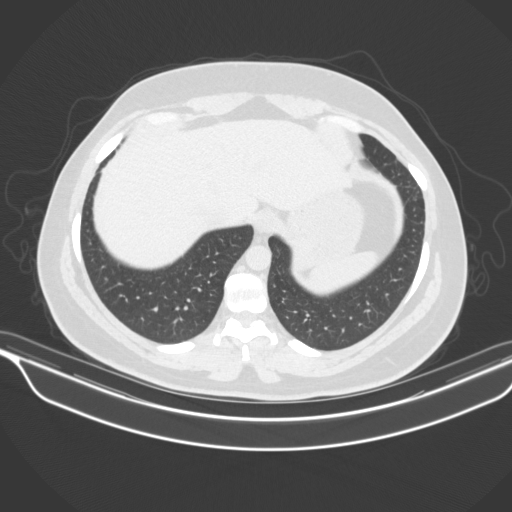
[im 55/147  mediastinal]
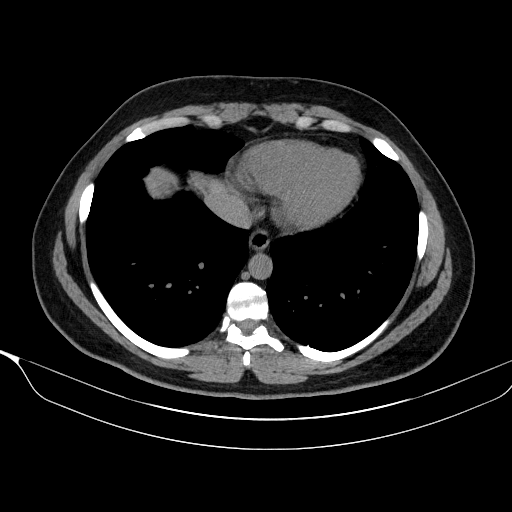
[im 55/147  lung]
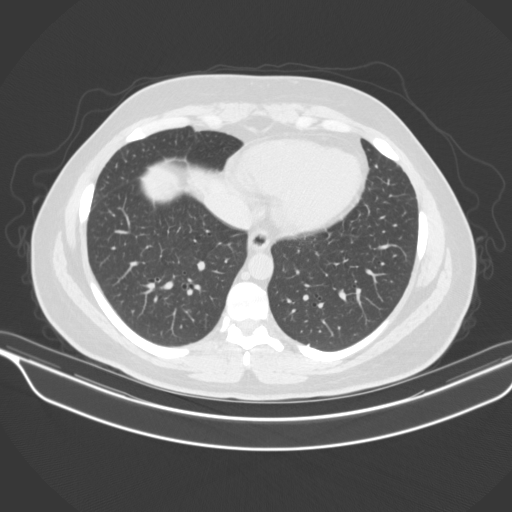
[im 60/147  lung]
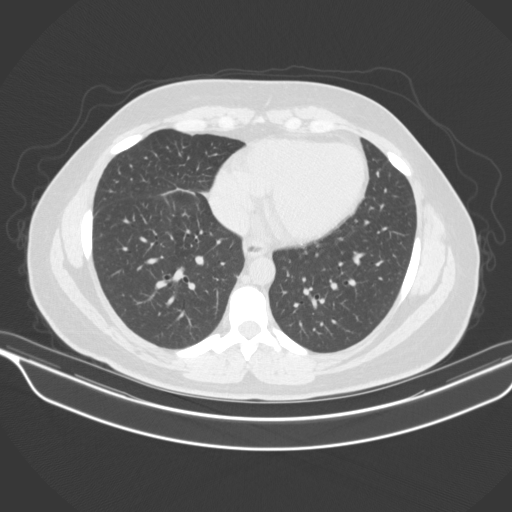
[im 69/147  lung]
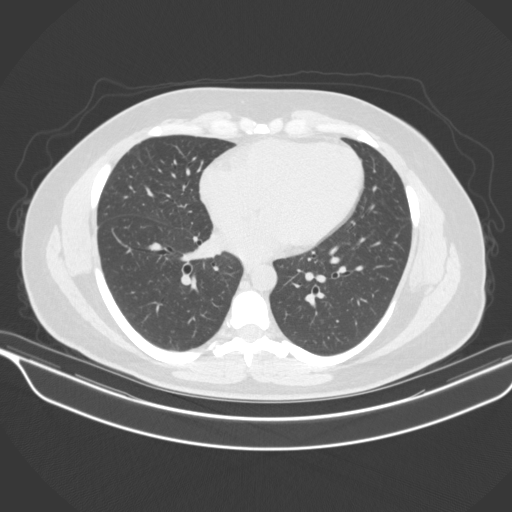
[im 78/147  lung]
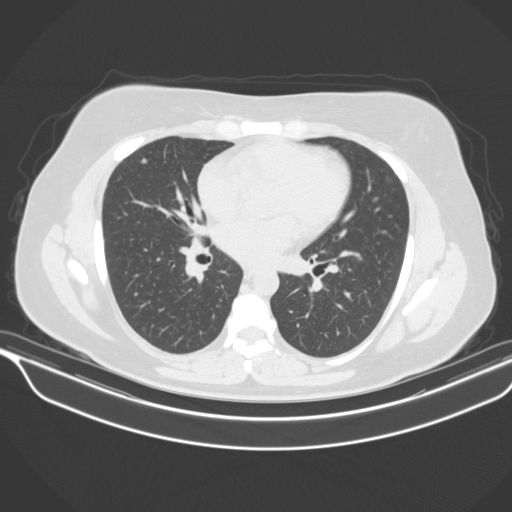
[im 87/147  mediastinal]
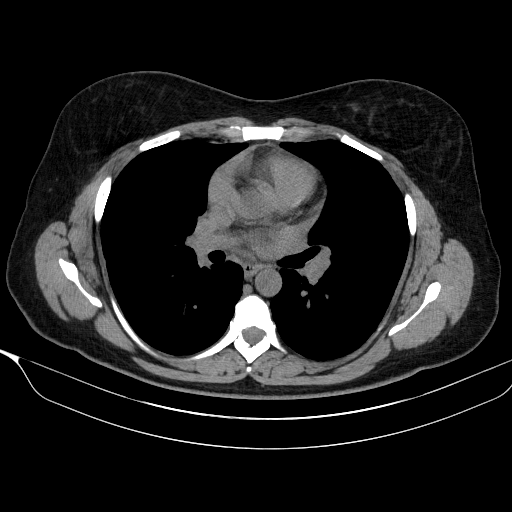
[im 87/147  lung]
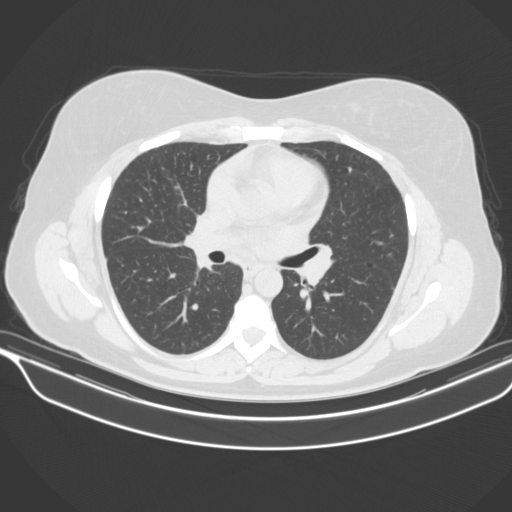
[im 92/147  lung]
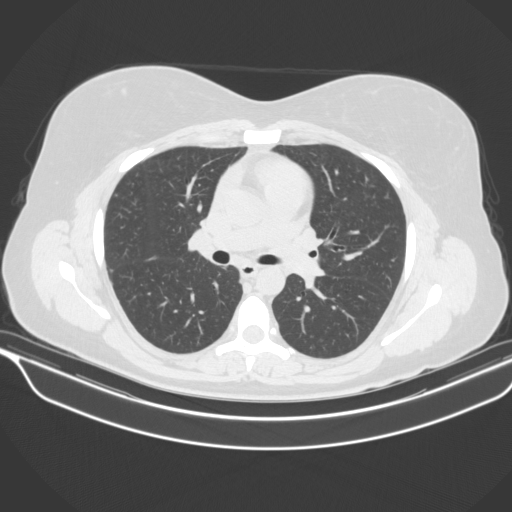
[im 109/147  lung]
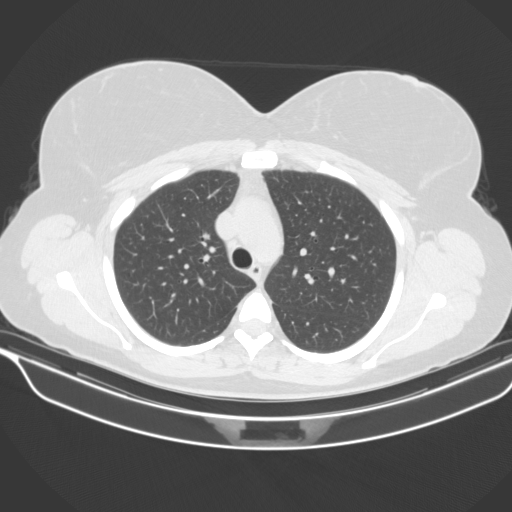
[im 117/147  lung]
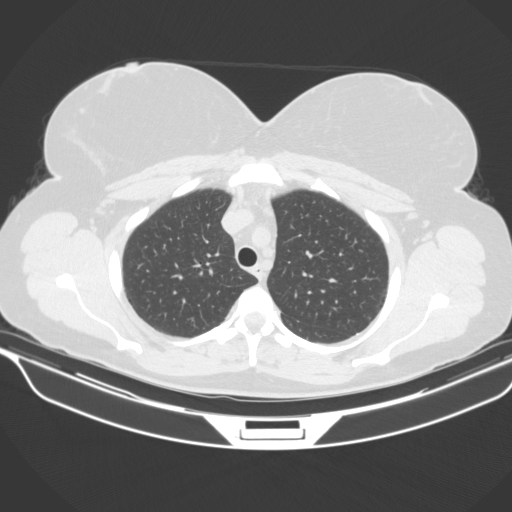
[im 125/147  mediastinal]
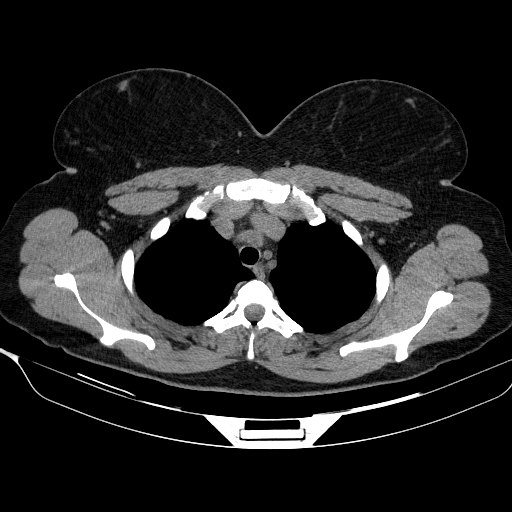
[im 125/147  lung]
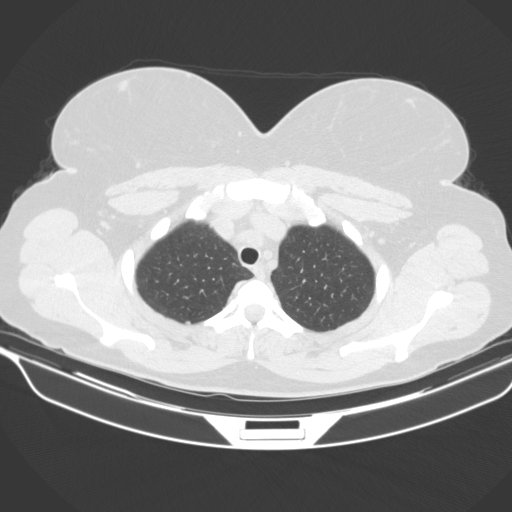
[im 136/147  lung]
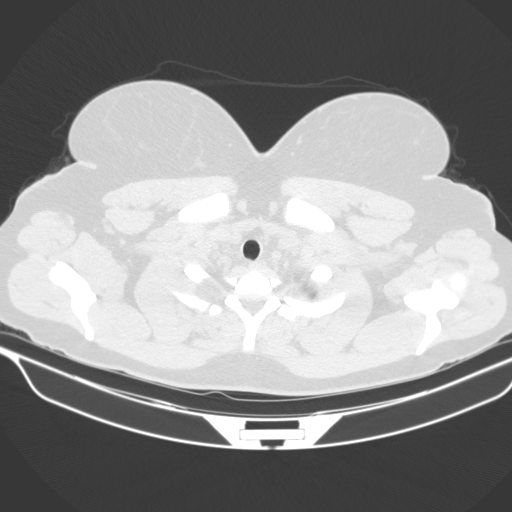

[14 of 34 positions shown; findings below may reference images not displayed]

FINDINGS: Cardiovascular: Normal heart size. No pericardial effusion. Aortic
root and ascending aorta are limited in their evaluation due to
motion artifact. Aorta is normal caliber. No hyperdense mural
thickening, periaortic stranding or hemorrhage is seen. Central
pulmonary arteries are normal caliber. Luminal evaluation of the
vasculature precluded in the absence of contrast media. No major
venous abnormalities.

Mediastinum/Nodes: No mediastinal fluid or gas. Normal thyroid gland
and thoracic inlet. No acute abnormality of the trachea or
esophagus. No worrisome mediastinal, hilar or axillary adenopathy.

Lungs/Pleura: No consolidation, features of edema, pneumothorax, or
effusion. Solid 4 mm right middle lobe nodule (5/71). Pair of 2 mm
perifissural nodules adjacent the minor fissure (5/62). Additional 2
mm nodule in the anterior segment right upper lobe (5/57) few
several smaller subpleural micro nodules in the immediate vicinity
as well. Some adjacent bandlike opacities may reflect areas of
scarring. No other concerning pulmonary nodules or masses.

Upper Abdomen: No acute abnormalities present in the visualized
portions of the upper abdomen.

Musculoskeletal: Vitamin-E markers are positioned at the T12 level
approximately 1 cm to the right of midline and more laterally at the
L1 level, 10.5 cm to the left of midline. No suspicious or worrisome
osseous or soft tissue abnormalities are present in the indicated
area of concern. No focal soft tissue lesions. No subjacent acute or
chronic osseous abnormalities in these locations or elsewhere within
the chest. Incidental note made of congenital nonfusion of the L1
transverse processes.
IMPRESSION: 1. No suspicious or worrisome osseous or soft tissue abnormalities
are present in the indicated areas of concern.
2. No acute intrathoracic process.
3. Multiple small pulmonary nodules measuring up to 4 mm in the
right middle lobe. Given the patient age, clustered appearance and
some adjacent bandlike opacity favor this to be post infectious or
inflammatory. No follow-up needed if patient is low-risk (and has no
known or suspected primary neoplasm). Non-contrast chest CT can be
considered in 12 months if patient is high-risk. This recommendation
follows the consensus statement: Guidelines for Management of
Incidental Pulmonary Nodules Detected on CT Images: From the

## 2022-01-22 IMAGING — CT CT T SPINE W/O CM
1 series · 12 of 14 positions shown, 15 images · non-contrast
Comparison: None.

CLINICAL DATA: Mid back pain

EXAM:
CT THORACIC SPINE WITHOUT CONTRAST
TECHNIQUE: Multidetector CT images of the thoracic were obtained using the
standard protocol without intravenous contrast.

[Series 3: t spine soft · axial · 0.34mm/px · z∈[-320,-56]mm · 12 of 105 slices shown, 15 images]
[im 9/105  soft-tissue]
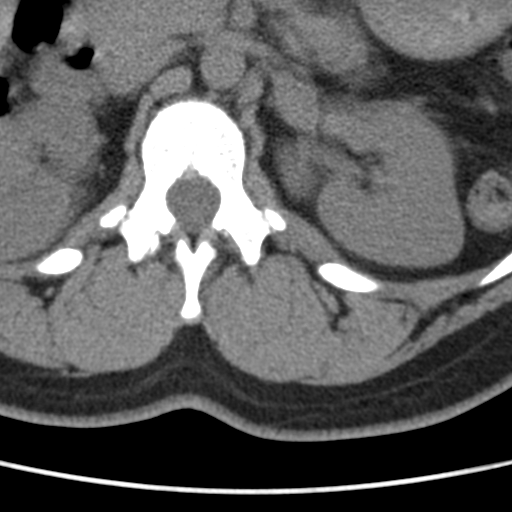
[im 9/105  bone]
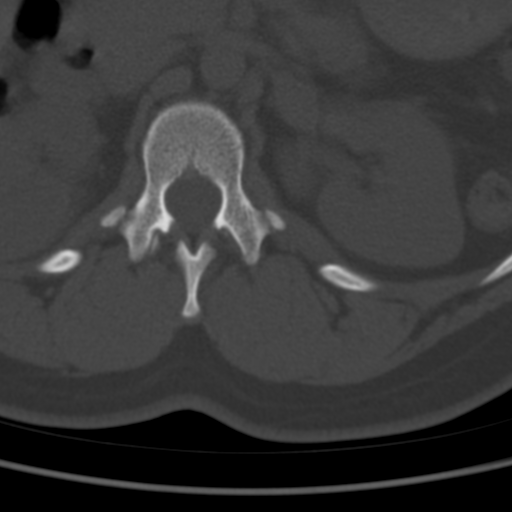
[im 17/105  bone]
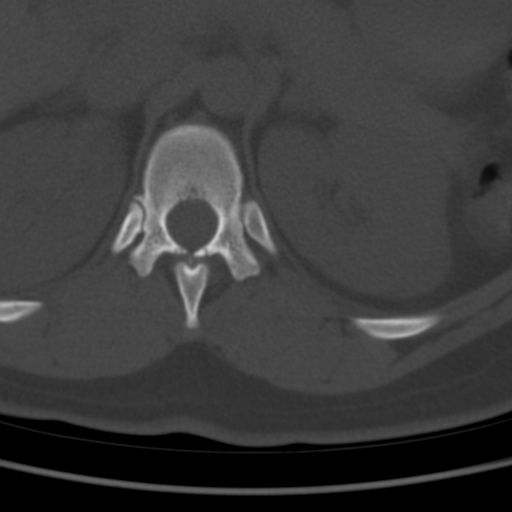
[im 25/105  bone]
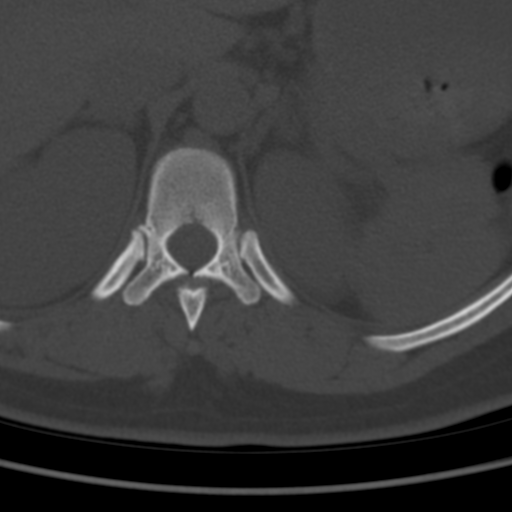
[im 33/105  bone]
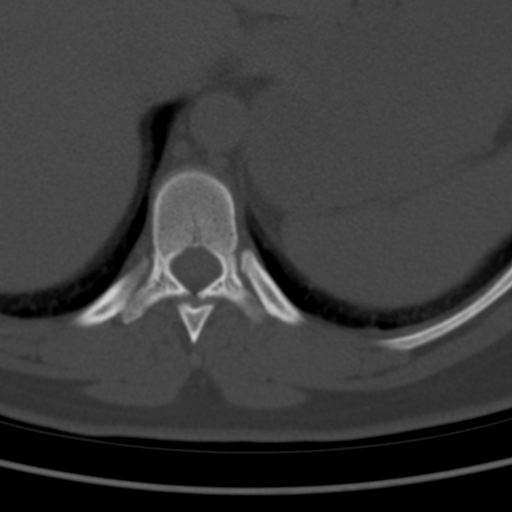
[im 41/105  soft-tissue]
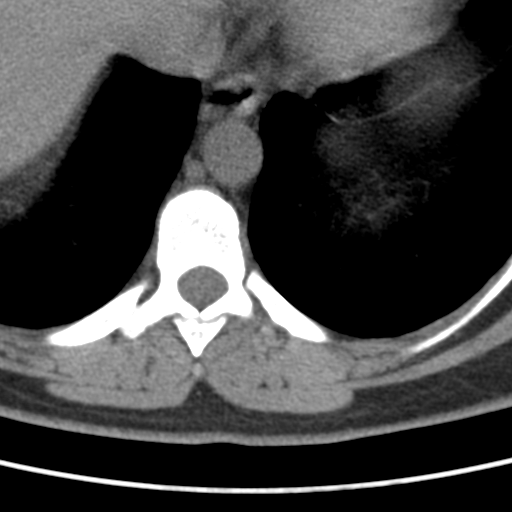
[im 41/105  bone]
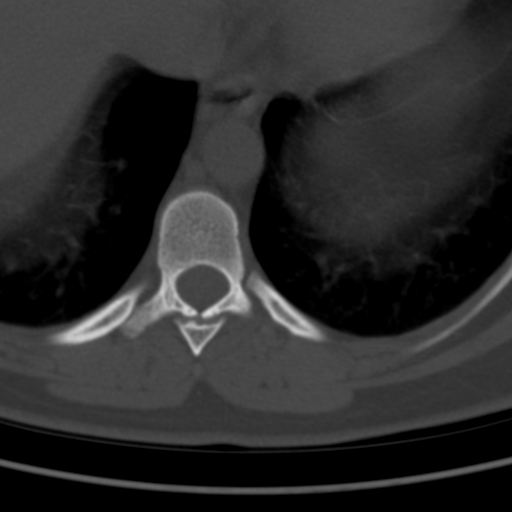
[im 49/105  bone]
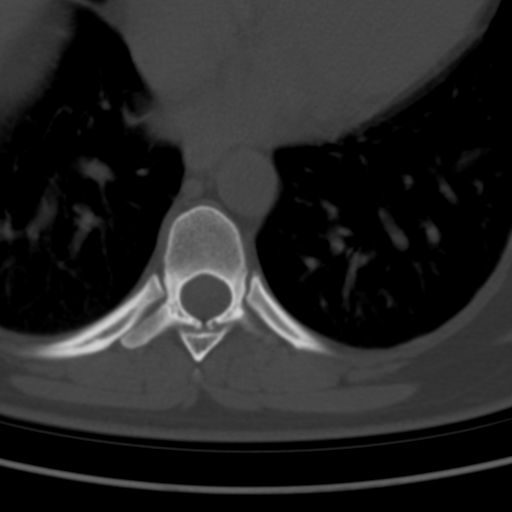
[im 57/105  bone]
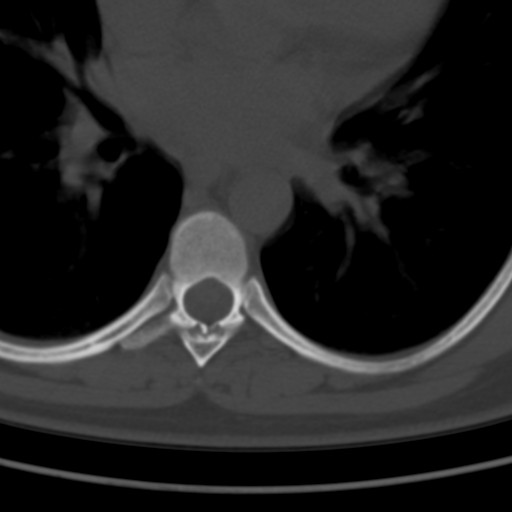
[im 65/105  bone]
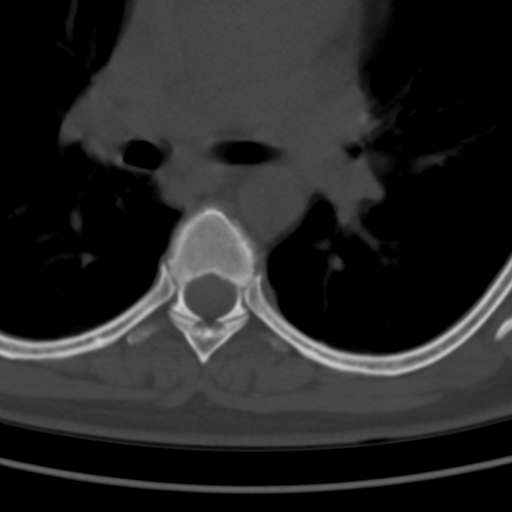
[im 73/105  soft-tissue]
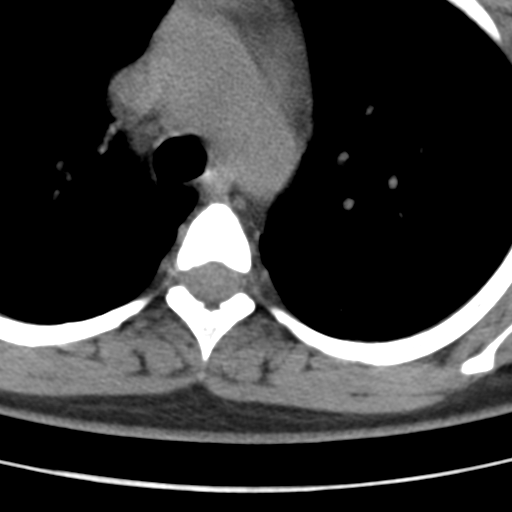
[im 73/105  bone]
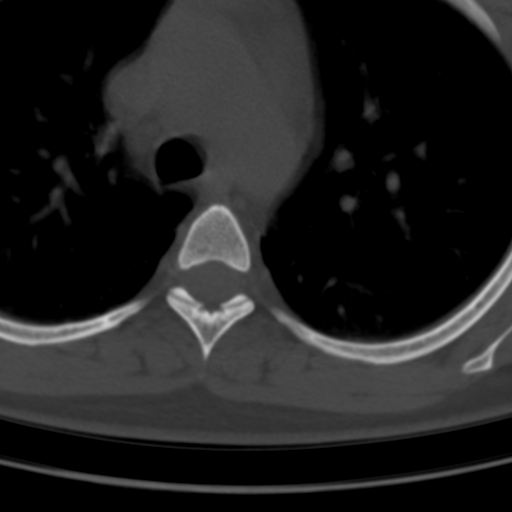
[im 81/105  bone]
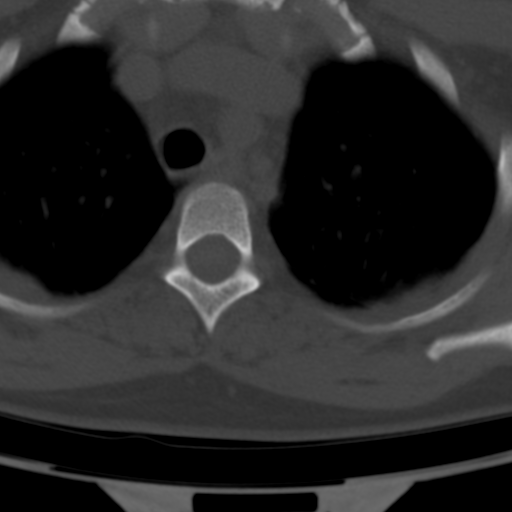
[im 89/105  bone]
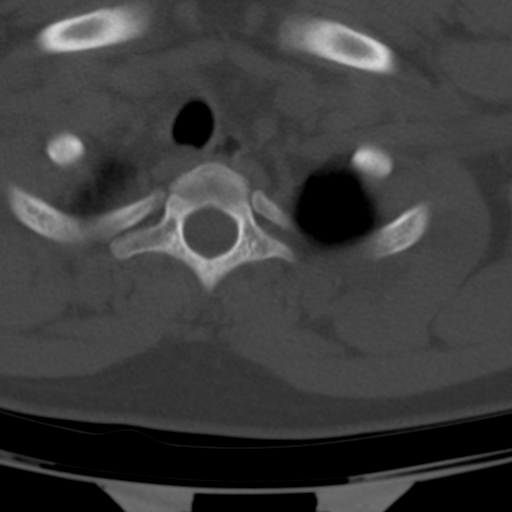
[im 97/105  bone]
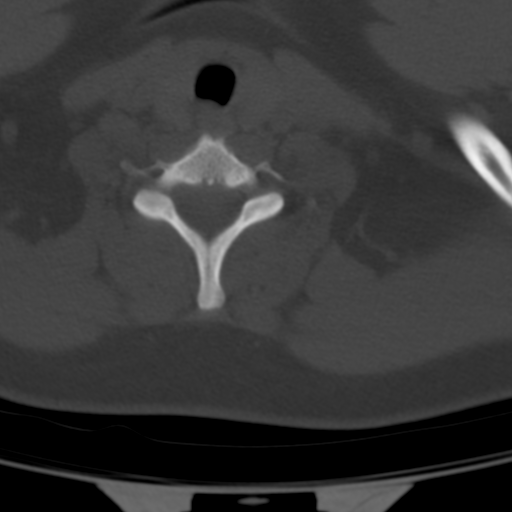

[12 of 14 positions shown; findings below may reference images not displayed]

FINDINGS: Alignment: Normal.

Vertebrae: No acute fracture or focal pathologic process.

Paraspinal and other soft tissues: Please see dedicated CT chest
report

Disc levels: No spinal canal stenosis or visible disc herniation.
IMPRESSION: No acute fracture or static subluxation of the thoracic spine.

## 2022-02-21 ENCOUNTER — Other Ambulatory Visit: Payer: Self-pay | Admitting: Obstetrics and Gynecology

## 2022-03-03 ENCOUNTER — Encounter (HOSPITAL_BASED_OUTPATIENT_CLINIC_OR_DEPARTMENT_OTHER): Payer: Self-pay | Admitting: Obstetrics and Gynecology

## 2022-03-03 ENCOUNTER — Other Ambulatory Visit: Payer: Self-pay

## 2022-03-03 ENCOUNTER — Encounter (HOSPITAL_COMMUNITY)
Admission: RE | Admit: 2022-03-03 | Discharge: 2022-03-03 | Disposition: A | Discharge status: Home / Self Care | Payer: Managed Care, Other (non HMO) | Source: Ambulatory Visit | Attending: Obstetrics and Gynecology | Admitting: Obstetrics and Gynecology

## 2022-03-03 DIAGNOSIS — Z01812 Encounter for preprocedural laboratory examination: Secondary | ICD-10-CM | POA: Insufficient documentation

## 2022-03-03 LAB — CBC
HCT: 41.1 % (ref 36.0–46.0)
Hemoglobin: 13.5 g/dL (ref 12.0–15.0)
MCH: 31.5 pg (ref 26.0–34.0)
MCHC: 32.8 g/dL (ref 30.0–36.0)
MCV: 96 fL (ref 80.0–100.0)
Platelets: 462 10*3/uL — ABNORMAL HIGH (ref 150–400)
RBC: 4.28 MIL/uL (ref 3.87–5.11)
RDW: 12.9 % (ref 11.5–15.5)
WBC: 10.9 10*3/uL — ABNORMAL HIGH (ref 4.0–10.5)
nRBC: 0 % (ref 0.0–0.2)

## 2022-03-03 LAB — BASIC METABOLIC PANEL
Anion gap: 7 (ref 5–15)
BUN: 13 mg/dL (ref 6–20)
CO2: 23 mmol/L (ref 22–32)
Calcium: 9.1 mg/dL (ref 8.9–10.3)
Chloride: 108 mmol/L (ref 98–111)
Creatinine, Ser: 0.61 mg/dL (ref 0.44–1.00)
GFR, Estimated: >60 mL/min (ref >60)
Glucose, Bld: 91 mg/dL (ref 70–99)
Potassium: 4.4 mmol/L (ref 3.5–5.1)
Sodium: 138 mmol/L (ref 135–145)

## 2022-03-03 NOTE — Progress Notes (Signed)
Spoke w/ via phone for pre-op interview--- pt ?Lab needs dos---- urine preg (per anes)             ?Lab results------  pt had lab work done today 03-03-2022 results in epic, CBC/ BMP/ T&S ?COVID test -----patient states asymptomatic no test needed ?Arrive at ------- 1130 on 03-06-2022 ?NPO after MN NO Solid Food.  Clear liquids from MN until--- 1030 ?Med rec completed ?Medications to take morning of surgery ----- protonix ?Diabetic medication ----- n/a ?Patient instructed no nail polish to be worn day of surgery ?Patient instructed to bring photo id and insurance card day of surgery ?Patient aware to have Driver (ride ) / caregiver for 24 hours after surgery ---sister, renita henderson ?Patient Special Instructions ----- n/a ?Pre-Op special Istructions ----- pt stated last seizure 2020/ 2021 does not take medication ?Patient verbalized understanding of instructions that were given at this phone interview. ?Patient denies shortness of breath, chest pain, fever, cough at this phone interview.  ?

## 2022-03-05 NOTE — Anesthesia Preprocedure Evaluation (Addendum)
Anesthesia Evaluation  ?Patient identified by MRN, date of birth, ID band ? ?Reviewed: ?Allergy & Precautions, NPO status , Patient's Chart, lab work & pertinent test results ? ?Airway ?Mallampati: II ? ?TM Distance: >3 FB ?Neck ROM: Full ? ? ? Dental ?no notable dental hx. ?(+) Teeth Intact, Dental Advisory Given ?  ?Pulmonary ?Current Smoker and Patient abstained from smoking.,  ?  ?Pulmonary exam normal ?breath sounds clear to auscultation ? ? ? ? ? ? Cardiovascular ?Exercise Tolerance: Good ?Normal cardiovascular exam ?Rhythm:Regular Rate:Normal ? ? ?  ?Neuro/Psych ? Headaches, Seizures - (last 2 years ago),  Anxiety   ? GI/Hepatic ?Neg liver ROS, GERD  ,  ?Endo/Other  ?negative endocrine ROS ? Renal/GU ?Lab Results ?     Component                Value               Date                 ?     CREATININE               0.61                03/03/2022          ?     K                        4.4                 03/03/2022           ?     ? ?  ?Musculoskeletal ?negative musculoskeletal ROS ?(+)  ? Abdominal ?(+) + obese (BMI 31.01),   ?Peds ? Hematology ?Lab Results ?     Component                Value               Date                ?     HGB                      13.5                03/03/2022           ?     HCT                      41.1                03/03/2022               PLT                      462 (H)             03/03/2022           ?   ?Anesthesia Other Findings ?ALL: Depakote, Valproic Acid, Dilantin ? Reproductive/Obstetrics ? ?  ? ? ? ? ? ? ? ? ? ? ? ? ? ?  ?  ? ? ? ? ? ? ? ?Anesthesia Physical ?Anesthesia Plan ? ?ASA: 2 ? ?Anesthesia Plan: General  ? ?Post-op Pain Management: Toradol IV (intra-op)*, Tylenol PO (pre-op)*, Dilaudid IV and Precedex  ? ?Induction: Intravenous ? ?PONV Risk Score and Plan: Treatment may vary due  to age or medical condition, Midazolam, Dexamethasone and Ondansetron ? ?Airway Management Planned: Oral ETT ? ?Additional Equipment:  None ? ?Intra-op Plan:  ? ?Post-operative Plan: Extubation in OR ? ?Informed Consent: I have reviewed the patients History and Physical, chart, labs and discussed the procedure including the risks, benefits and alternatives for the proposed anesthesia with the patient or authorized representative who has indicated his/her understanding and acceptance.  ? ? ? ?Dental advisory given ? ?Plan Discussed with:  ? ?Anesthesia Plan Comments:   ? ? ? ? ? ?Anesthesia Quick Evaluation ? ?

## 2022-03-06 ENCOUNTER — Encounter (HOSPITAL_BASED_OUTPATIENT_CLINIC_OR_DEPARTMENT_OTHER): Payer: Self-pay | Admitting: Obstetrics and Gynecology

## 2022-03-06 ENCOUNTER — Other Ambulatory Visit: Payer: Self-pay

## 2022-03-06 ENCOUNTER — Ambulatory Visit (HOSPITAL_BASED_OUTPATIENT_CLINIC_OR_DEPARTMENT_OTHER): Payer: Commercial Managed Care - HMO | Admitting: Anesthesiology

## 2022-03-06 ENCOUNTER — Encounter (HOSPITAL_BASED_OUTPATIENT_CLINIC_OR_DEPARTMENT_OTHER)
Admission: RE | Disposition: A | Discharge status: Home / Self Care | Payer: Self-pay | Source: Home / Self Care | Attending: Obstetrics and Gynecology

## 2022-03-06 ENCOUNTER — Ambulatory Visit (HOSPITAL_BASED_OUTPATIENT_CLINIC_OR_DEPARTMENT_OTHER)
Admission: RE | Admit: 2022-03-06 | Discharge: 2022-03-06 | Disposition: A | Discharge status: Home / Self Care | Payer: Commercial Managed Care - HMO | Attending: Obstetrics and Gynecology | Admitting: Obstetrics and Gynecology

## 2022-03-06 DIAGNOSIS — F419 Anxiety disorder, unspecified: Secondary | ICD-10-CM | POA: Insufficient documentation

## 2022-03-06 DIAGNOSIS — N83292 Other ovarian cyst, left side: Secondary | ICD-10-CM | POA: Diagnosis present

## 2022-03-06 DIAGNOSIS — N83202 Unspecified ovarian cyst, left side: Secondary | ICD-10-CM | POA: Diagnosis not present

## 2022-03-06 DIAGNOSIS — D271 Benign neoplasm of left ovary: Secondary | ICD-10-CM | POA: Diagnosis not present

## 2022-03-06 DIAGNOSIS — Z6831 Body mass index (BMI) 31.0-31.9, adult: Secondary | ICD-10-CM | POA: Diagnosis not present

## 2022-03-06 DIAGNOSIS — F17210 Nicotine dependence, cigarettes, uncomplicated: Secondary | ICD-10-CM | POA: Insufficient documentation

## 2022-03-06 DIAGNOSIS — E669 Obesity, unspecified: Secondary | ICD-10-CM | POA: Diagnosis not present

## 2022-03-06 DIAGNOSIS — K219 Gastro-esophageal reflux disease without esophagitis: Secondary | ICD-10-CM | POA: Diagnosis not present

## 2022-03-06 HISTORY — PX: ROBOTIC ASSISTED LAPAROSCOPIC OVARIAN CYSTECTOMY: SHX6081

## 2022-03-06 HISTORY — DX: Personal history of COVID-19: Z86.16

## 2022-03-06 HISTORY — DX: Other complications of anesthesia, initial encounter: T88.59XA

## 2022-03-06 HISTORY — DX: Epilepsy, unspecified, not intractable, without status epilepticus: G40.909

## 2022-03-06 HISTORY — DX: Family history of other specified conditions: Z84.89

## 2022-03-06 HISTORY — DX: Gastro-esophageal reflux disease without esophagitis: K21.9

## 2022-03-06 HISTORY — DX: Unspecified ovarian cyst, left side: N83.202

## 2022-03-06 HISTORY — DX: Anxiety disorder, unspecified: F41.9

## 2022-03-06 HISTORY — DX: Depression, unspecified: F32.A

## 2022-03-06 LAB — TYPE AND SCREEN
ABO/RH(D): B POS
Antibody Screen: NEGATIVE

## 2022-03-06 LAB — POCT PREGNANCY, URINE: Preg Test, Ur: NEGATIVE

## 2022-03-06 SURGERY — EXCISION, CYST, OVARY, ROBOT-ASSISTED, LAPAROSCOPIC
Anesthesia: General | Site: Abdomen | Laterality: Left

## 2022-03-06 MED ORDER — FENTANYL CITRATE (PF) 100 MCG/2ML IJ SOLN
INTRAMUSCULAR | Status: DC | PRN
  Administered 2022-03-06 (×2): 50 ug via INTRAVENOUS
  Administered 2022-03-06: 100 ug via INTRAVENOUS
  Administered 2022-03-06 (×2): 25 ug via INTRAVENOUS

## 2022-03-06 MED ORDER — POVIDONE-IODINE 10 % EX SWAB
2.0000 | Freq: Once | CUTANEOUS | Status: DC
Start: 2022-03-06 — End: 2022-03-06

## 2022-03-06 MED ORDER — IBUPROFEN 800 MG PO TABS: 800.0000 mg | ORAL_TABLET | Freq: Three times a day (TID) | ORAL | 0 refills | Status: DC | PRN

## 2022-03-06 MED ORDER — KETOROLAC TROMETHAMINE 30 MG/ML IJ SOLN
INTRAMUSCULAR | Status: DC | PRN
Start: 2022-03-06 — End: 2022-03-06
  Administered 2022-03-06: 30 mg via INTRAVENOUS

## 2022-03-06 MED ORDER — MIDAZOLAM HCL 2 MG/2ML IJ SOLN
INTRAMUSCULAR | Status: DC | PRN
  Administered 2022-03-06: 2 mg via INTRAVENOUS

## 2022-03-06 MED ORDER — CEFAZOLIN SODIUM-DEXTROSE 2-4 GM/100ML-% IV SOLN
2.0000 g | INTRAVENOUS | Status: AC
  Administered 2022-03-06: 2 g via INTRAVENOUS

## 2022-03-06 MED ORDER — BUPIVACAINE HCL (PF) 0.25 % IJ SOLN
INTRAMUSCULAR | Status: DC | PRN
  Administered 2022-03-06: 10 mL

## 2022-03-06 MED ORDER — CEFAZOLIN SODIUM-DEXTROSE 2-4 GM/100ML-% IV SOLN
INTRAVENOUS | Status: AC
  Filled 2022-03-06: qty 100

## 2022-03-06 MED ORDER — HYDROMORPHONE HCL 1 MG/ML IJ SOLN
INTRAMUSCULAR | Status: AC
  Filled 2022-03-06: qty 1

## 2022-03-06 MED ORDER — DEXAMETHASONE SODIUM PHOSPHATE 4 MG/ML IJ SOLN
INTRAMUSCULAR | Status: DC | PRN
  Administered 2022-03-06: 8 mg via INTRAVENOUS

## 2022-03-06 MED ORDER — KETAMINE HCL 50 MG/5ML IJ SOSY
PREFILLED_SYRINGE | INTRAMUSCULAR | Status: AC
  Filled 2022-03-06: qty 5

## 2022-03-06 MED ORDER — OXYCODONE HCL 5 MG PO TABS: 5.0000 mg | ORAL_TABLET | Freq: Four times a day (QID) | ORAL | 0 refills | Status: DC | PRN

## 2022-03-06 MED ORDER — FENTANYL CITRATE (PF) 250 MCG/5ML IJ SOLN
INTRAMUSCULAR | Status: AC
  Filled 2022-03-06: qty 5

## 2022-03-06 MED ORDER — SODIUM CHLORIDE 0.9 % IR SOLN
Status: DC | PRN
  Administered 2022-03-06: 1000 mL
  Administered 2022-03-06: 3000 mL

## 2022-03-06 MED ORDER — PROPOFOL 10 MG/ML IV BOLUS
INTRAVENOUS | Status: AC
  Filled 2022-03-06: qty 20

## 2022-03-06 MED ORDER — LIDOCAINE HCL (PF) 2 % IJ SOLN
INTRAMUSCULAR | Status: DC | PRN
  Administered 2022-03-06: 1 mg/kg/h via INTRADERMAL

## 2022-03-06 MED ORDER — HYDROMORPHONE HCL 1 MG/ML IJ SOLN
0.2500 mg | INTRAMUSCULAR | Status: DC | PRN
  Administered 2022-03-06 (×2): 0.25 mg via INTRAVENOUS

## 2022-03-06 MED ORDER — ROCURONIUM BROMIDE 10 MG/ML (PF) SYRINGE
PREFILLED_SYRINGE | INTRAVENOUS | Status: AC
  Filled 2022-03-06: qty 10

## 2022-03-06 MED ORDER — SUGAMMADEX SODIUM 200 MG/2ML IV SOLN
INTRAVENOUS | Status: DC | PRN
  Administered 2022-03-06: 200 mg via INTRAVENOUS

## 2022-03-06 MED ORDER — DEXMEDETOMIDINE (PRECEDEX) IN NS 20 MCG/5ML (4 MCG/ML) IV SYRINGE
PREFILLED_SYRINGE | INTRAVENOUS | Status: DC | PRN
  Administered 2022-03-06: 8 ug via INTRAVENOUS
  Administered 2022-03-06: 4 ug via INTRAVENOUS

## 2022-03-06 MED ORDER — DEXMEDETOMIDINE (PRECEDEX) IN NS 20 MCG/5ML (4 MCG/ML) IV SYRINGE
PREFILLED_SYRINGE | INTRAVENOUS | Status: AC
  Filled 2022-03-06: qty 10

## 2022-03-06 MED ORDER — SODIUM CHLORIDE 0.9 % IV SOLN
INTRAVENOUS | Status: DC | PRN
  Administered 2022-03-06: 60 mL

## 2022-03-06 MED ORDER — LACTATED RINGERS IV SOLN: INTRAVENOUS | Status: DC

## 2022-03-06 MED ORDER — PROPOFOL 10 MG/ML IV BOLUS
INTRAVENOUS | Status: DC | PRN
  Administered 2022-03-06: 20 mg via INTRAVENOUS
  Administered 2022-03-06: 170 mg via INTRAVENOUS
  Administered 2022-03-06 (×3): 20 mg via INTRAVENOUS

## 2022-03-06 MED ORDER — ROCURONIUM BROMIDE 100 MG/10ML IV SOLN
INTRAVENOUS | Status: DC | PRN
  Administered 2022-03-06: 10 mg via INTRAVENOUS
  Administered 2022-03-06: 80 mg via INTRAVENOUS
  Administered 2022-03-06: 10 mg via INTRAVENOUS

## 2022-03-06 MED ORDER — 0.9 % SODIUM CHLORIDE (POUR BTL) OPTIME
TOPICAL | Status: DC | PRN
  Administered 2022-03-06: 500 mL

## 2022-03-06 MED ORDER — KETAMINE HCL 10 MG/ML IJ SOLN
INTRAMUSCULAR | Status: DC | PRN
  Administered 2022-03-06: 10 mg via INTRAVENOUS

## 2022-03-06 MED ORDER — KETOROLAC TROMETHAMINE 30 MG/ML IJ SOLN: 30.0000 mg | Freq: Once | INTRAMUSCULAR | Status: DC | PRN

## 2022-03-06 MED ORDER — LIDOCAINE HCL (CARDIAC) PF 100 MG/5ML IV SOSY
PREFILLED_SYRINGE | INTRAVENOUS | Status: DC | PRN
  Administered 2022-03-06: 60 mg via INTRAVENOUS

## 2022-03-06 MED ORDER — ONDANSETRON HCL 4 MG/2ML IJ SOLN: 4.0000 mg | Freq: Once | INTRAMUSCULAR | Status: DC | PRN

## 2022-03-06 MED ORDER — OXYCODONE HCL 5 MG PO TABS: 5.0000 mg | ORAL_TABLET | Freq: Once | ORAL | Status: DC | PRN

## 2022-03-06 MED ORDER — MIDAZOLAM HCL 2 MG/2ML IJ SOLN
INTRAMUSCULAR | Status: AC
  Filled 2022-03-06: qty 2

## 2022-03-06 MED ORDER — OXYCODONE HCL 5 MG/5ML PO SOLN: 5.0000 mg | Freq: Once | ORAL | Status: DC | PRN

## 2022-03-06 SURGICAL SUPPLY — 79 items
ADH SKN CLS APL DERMABOND .7 (GAUZE/BANDAGES/DRESSINGS) ×2
APL PRP STRL LF DISP 70% ISPRP (MISCELLANEOUS) ×2
APL SKNCLS STERI-STRIP NONHPOA (GAUZE/BANDAGES/DRESSINGS) ×2
APL SWBSTK 6 STRL LF DISP (MISCELLANEOUS) ×4
APPLICATOR COTTON TIP 6 STRL (MISCELLANEOUS) ×2 IMPLANT
APPLICATOR COTTON TIP 6IN STRL (MISCELLANEOUS) ×6
BAG COUNTER SPONGE SURGICOUNT (BAG) ×3 IMPLANT
BAG RETRIEVAL 10 (BASKET) ×1
BAG SPNG CNTER NS LX DISP (BAG) ×2
BENZOIN TINCTURE PRP APPL 2/3 (GAUZE/BANDAGES/DRESSINGS) ×3 IMPLANT
CHLORAPREP W/TINT 26 (MISCELLANEOUS) ×3 IMPLANT
CNTNR URN SCR LID CUP LEK RST (MISCELLANEOUS) ×2 IMPLANT
CONT SPEC 4OZ STRL OR WHT (MISCELLANEOUS) ×3
COVER BACK TABLE 60X90IN (DRAPES) ×3 IMPLANT
COVER TIP SHEARS 8 DVNC (MISCELLANEOUS) ×2 IMPLANT
COVER TIP SHEARS 8MM DA VINCI (MISCELLANEOUS) ×1
DECANTER SPIKE VIAL GLASS SM (MISCELLANEOUS) ×4 IMPLANT
DEFOGGER SCOPE WARMER CLEARIFY (MISCELLANEOUS) ×3 IMPLANT
DERMABOND ADVANCED (GAUZE/BANDAGES/DRESSINGS) ×1
DERMABOND ADVANCED .7 DNX12 (GAUZE/BANDAGES/DRESSINGS) ×2 IMPLANT
DRAPE ARM DVNC X/XI (DISPOSABLE) ×8 IMPLANT
DRAPE COLUMN DVNC XI (DISPOSABLE) ×2 IMPLANT
DRAPE DA VINCI XI ARM (DISPOSABLE) ×4
DRAPE DA VINCI XI COLUMN (DISPOSABLE) ×1
DRAPE UTILITY XL STRL (DRAPES) ×3 IMPLANT
DRSG OPSITE POSTOP 4X10 (GAUZE/BANDAGES/DRESSINGS) ×3 IMPLANT
DURAPREP 26ML APPLICATOR (WOUND CARE) ×3 IMPLANT
ELECT REM PT RETURN 9FT ADLT (ELECTROSURGICAL) ×3
ELECTRODE REM PT RTRN 9FT ADLT (ELECTROSURGICAL) ×2 IMPLANT
GAUZE 4X4 16PLY ~~LOC~~+RFID DBL (SPONGE) ×6 IMPLANT
GLOVE SURG ENC MOIS LTX SZ6.5 (GLOVE) ×9 IMPLANT
GLOVE SURG ENC MOIS LTX SZ7.5 (GLOVE) ×2 IMPLANT
GLOVE SURG LTX SZ6.5 (GLOVE) ×3 IMPLANT
GLOVE SURG POLYISO LF SZ7 (GLOVE) ×2 IMPLANT
GLOVE SURG UNDER POLY LF SZ6.5 (GLOVE) ×2 IMPLANT
GLOVE SURG UNDER POLY LF SZ7 (GLOVE) ×21 IMPLANT
GOWN STRL REUS W/ TWL LRG LVL3 (GOWN DISPOSABLE) ×6 IMPLANT
GOWN STRL REUS W/TWL LRG LVL3 (GOWN DISPOSABLE) ×9
IRRIG SUCT STRYKERFLOW 2 WTIP (MISCELLANEOUS) ×3
IRRIGATION SUCT STRKRFLW 2 WTP (MISCELLANEOUS) ×2 IMPLANT
IV NS 1000ML (IV SOLUTION) ×3
IV NS 1000ML BAXH (IV SOLUTION) ×2 IMPLANT
IV NS IRRIG 3000ML ARTHROMATIC (IV SOLUTION) ×2 IMPLANT
KIT TURNOVER CYSTO (KITS) ×3 IMPLANT
LEGGING LITHOTOMY PAIR STRL (DRAPES) ×3 IMPLANT
MANIFOLD NEPTUNE II (INSTRUMENTS) ×2 IMPLANT
NEEDLE INSUFFLATION 120MM (ENDOMECHANICALS) ×3 IMPLANT
NS IRRIG 500ML POUR BTL (IV SOLUTION) ×2 IMPLANT
OBTURATOR OPTICAL STANDARD 8MM (TROCAR) ×1
OBTURATOR OPTICAL STND 8 DVNC (TROCAR) ×2
OBTURATOR OPTICALSTD 8 DVNC (TROCAR) ×2 IMPLANT
OCCLUDER COLPOPNEUMO (BALLOONS) ×3 IMPLANT
PACK ABDOMINAL GYN (CUSTOM PROCEDURE TRAY) ×3 IMPLANT
PACK ROBOT WH (CUSTOM PROCEDURE TRAY) ×3 IMPLANT
PACK ROBOTIC GOWN (GOWN DISPOSABLE) ×3 IMPLANT
PACK TRENDGUARD 450 HYBRID PRO (MISCELLANEOUS) ×1 IMPLANT
PAD ARMBOARD 7.5X6 YLW CONV (MISCELLANEOUS) ×3 IMPLANT
PAD OB MATERNITY 4.3X12.25 (PERSONAL CARE ITEMS) ×3 IMPLANT
PAD PREP 24X48 CUFFED NSTRL (MISCELLANEOUS) ×3 IMPLANT
PORT ACCESS TROCAR AIRSEAL 12 (TROCAR) ×1 IMPLANT
PORT ACCESS TROCAR AIRSEAL 5M (TROCAR) ×1
PROTECTOR NERVE ULNAR (MISCELLANEOUS) ×6 IMPLANT
RTRCTR C-SECT PINK 25CM LRG (MISCELLANEOUS) IMPLANT
SEAL CANN UNIV 5-8 DVNC XI (MISCELLANEOUS) ×8 IMPLANT
SEAL XI 5MM-8MM UNIVERSAL (MISCELLANEOUS) ×4
SEALER VESSEL DA VINCI XI (MISCELLANEOUS)
SEALER VESSEL EXT DVNC XI (MISCELLANEOUS) ×1 IMPLANT
SET TRI-LUMEN FLTR TB AIRSEAL (TUBING) ×3 IMPLANT
SOL PREP POV-IOD 4OZ 10% (MISCELLANEOUS) ×2 IMPLANT
STRIP CLOSURE SKIN 1/2X4 (GAUZE/BANDAGES/DRESSINGS) ×3 IMPLANT
SUT VIC AB 4-0 PS2 27 (SUTURE) ×6 IMPLANT
SUT VICRYL 0 UR6 27IN ABS (SUTURE) ×5 IMPLANT
SYS BAG RETRIEVAL 10MM (BASKET) ×2
SYSTEM BAG RETRIEVAL 10MM (BASKET) ×1 IMPLANT
SYSTEM CARTER THOMASON II (TROCAR) ×2 IMPLANT
TOWEL OR 17X26 10 PK STRL BLUE (TOWEL DISPOSABLE) ×3 IMPLANT
TRAY FOLEY W/BAG SLVR 14FR LF (SET/KITS/TRAYS/PACK) ×3 IMPLANT
TRENDGUARD 450 HYBRID PRO PACK (MISCELLANEOUS) ×3
TROCAR PORT AIRSEAL 8X120 (TROCAR) ×2 IMPLANT

## 2022-03-06 NOTE — Op Note (Signed)
Virginia Bradley ?[birth year] ?[id number] ? ?OPERATIVE NOTE ? ?PROCEDURE: robotic assisted left ovarian cystectomy ?  ?PRE-OPERATIVE DIAGNOSIS:  ?Left complex ovarian cyst consistent with dermoid cyst ?  ?POST-OPERATIVE DIAGNOSIS:  ?As above ?  ?SURGEON: Dr. Langley Gauss, DO ?  ?ASSISTANT: Dr. Brien Few, MD ?  ?FINDINGS:  normal appearing external female genitalia and normal appearing ectocervix. Laparoscopy reveals normal intraabdominal cavity survey including liver edge. Female pelvic anatomy significant for surgically absent right fallopian tube and ovary, normal appearing uterus with smooth contours, normal appearing left fallopian tube, enlarged left ovary with ~10 cm cyst ruptured for sebaceous fluid and hair consistent with dermoid  ?  ?SPECIMENS: left ovarian dermoid cyst ?  ?EBL: 50 cc ?  ?FLUIDS: per anesthesia records ?  ?UOP: 100 cc clear ? ?ANTIBIOTICS: Ancef 2 g ?  ?COMPLICATIONS: None ?  ?PROCEDURE IN DETAIL:  ?  ?After the patient was appropriately consented in the holding area, she was taken to the operating room where general anesthesia was administered without complications. The patient was placed in the dorsal lithotomy position. Bilateral arms were tucked with the appropriate barrier padding. The patient was prepped and draped in the usual sterile fashion. An appropriate time out was performed that verified the correct patient, procedure, and surgical team.  ?  ?Attention was made to the pelvis first. The Foley catheter was placed into the bladder which was drained of clear yellow urine and remained in place for the duration of the procedure. A sterile speculum was placed inside the vagina and the cervix was visualized. A [place] uterine manipulator was secured into place and speculum removed. ?  ?Attention was turned to the abdomen where the laparoscopic equipment had been assembled. Local anesthetic using 0.25% plan Marcaine was injected just superior to the umbilicus. The scalpel was used to  make a small transverse skin incision. The abdomen was entered using the Veress needle technique. The Veress needle was advanced through the skin incision until 2 clicks were appreciated. The gas was connected, turned on, and low entry pressure of 1 mmHg was appreciated. Pneumoperitoneum was obtained without complications to a set pressure of 15 mmHg. An 8 mm robotic trocar was introduced through the incision. The robotic camera was introduced and confirmed intraperitoneal entry. Inspection of the area below revealed no gross damage to the underlying structures. The patient was placed into Trendelenburg positioning to aid in visualization and inspection of the abdomen revealed the findings as noted above. The patient was laid flat and the remaining port sites were placed under laparoscopic visualization, utilizing 1 additional robotic trocar on the left and 2 additional robotic trocars on the right. A 12 mm AirSeal assist port was also placed on the left. The robot was then moved into place and the robotic arms connected to the trocars. Targeting performed and robotic instruments introduced under direct visualization.  ?  ?Attention was then turned to the robotic console. The left ovarian cyst was delivered out of the pelvis and laid on the anterior surface of the uterus for better surgical visualization. The monopolar scissors were used to make an incision into the ovarian wall away from the hilum. Cyst rupture did occur and suction irrigation was performed of the cyst contents. The cyst wall was then separated out from the ovarian stroma using a traction counter-traction technique. The cyst wall peeled away easily from the ovary. Once the cyst wall was separated, copious irrigation of the ovarian stroma performed and found to be hemostatic. Majority of cyst wall  felt to be successfully resected. There may possibly be additional cyst remnant near the hilum, however given significant emphasis on ovarian preservation  in this case due to history of previous oophorectomy, the decision was made to avoid risk of injury to the hilum region which could compromise ovarian blood flow and increase risk for needing oophorectomy. The remainder of the procedure with cyst removal was performed at the bedside.  ?  ?The robotic instruments were all removed and the robot undocked. A 10 mm laparoscopic specimen retrieval bag was introduced through the 12 mm assist port site and deployed. The specimen was placed within the bag and removed. Some specimen morcellation was performed within the bag at the level of the incision to allow for complete removal without incision extension. All specimens sent for final pathology. Additional copious irrigation and suction performed of the abdomen and pelvis. A final inspection of the ovary again revealed excellent hemostasis.  ?  ?The fascia was closed at the 12 mm trocar site using the laparoscopic [place] fascia closure device. Two sutures using a 0-Vicryl on a UR-6 needle. The trocars were removed under laparoscopic visualization. The gas was turned off and the abdomen allowed to desufflate. The skin incisions were closed with 4-0 Vicryl and skin glue. The uterine manipulator and foley catheter were removed at the end of the procedure. The patient tolerated the procedure well and was taken to the recovery area in stable condition. All instrument, needle, and lap counts were correct.  ? ?Virginia Bradley ?03/06/22 ?4:06 PM ? ? ? ?

## 2022-03-06 NOTE — Discharge Instructions (Addendum)
No ibuprofen, Advil, Aleve, Motrin, ketorolac, meloxicam, naproxen, or other NSAIDS until after 9:30pm today if needed for pain. ? ? ? ?Post Anesthesia Home Care Instructions ? ?Activity: ?Get plenty of rest for the remainder of the day. A responsible individual must stay with you for 24 hours following the procedure.  ?For the next 24 hours, DO NOT: ?-Drive a car ?-Paediatric nurse ?-Drink alcoholic beverages ?-Take any medication unless instructed by your physician ?-Make any legal decisions or sign important papers. ? ?Meals: ?Start with liquid foods such as gelatin or soup. Progress to regular foods as tolerated. Avoid greasy, spicy, heavy foods. If nausea and/or vomiting occur, drink only clear liquids until the nausea and/or vomiting subsides. Call your physician if vomiting continues. ? ?Special Instructions/Symptoms: ?Your throat may feel dry or sore from the anesthesia or the breathing tube placed in your throat during surgery. If this causes discomfort, gargle with warm salt water. The discomfort should disappear within 24 hours. ? ? ? ?DISCHARGE INSTRUCTIONS: Laparoscopy ? ?The following instructions have been prepared to help you care for yourself upon your return home today. ? ?Wound care: ? Do not get the incision wet for the first 24 hours. The incision should be kept clean and dry. ? The Band-Aids or dressings may be removed the day after surgery. ? Should the incision become sore, red, and swollen after the first week, check with your doctor. ? ?Personal hygiene: ? Shower the day after your procedure. ? ?Activity and limitations: ? Do NOT drive or operate any equipment today. ? Do NOT lift anything more than 15 pounds for 2-3 weeks after surgery. ? Do NOT rest in bed all day. ? Walking is encouraged. Walk each day, starting slowly with 5-minute walks 3 or 4 times a day. Slowly increase the length of your walks. ? Walk up and down stairs slowly. ? Do NOT do strenuous activities, such as golfing,  playing tennis, bowling, running, biking, weight lifting, gardening, mowing, or vacuuming for 2-4 weeks. Ask your doctor when it is okay to start. ? ?Diet: Eat a light meal as desired this evening. You may resume your usual diet tomorrow. ? ?Return to work: This is dependent on the type of work you do. For the most part you can return to a desk job within a week of surgery. If you are more active at work, please discuss this with your doctor. ? ?What to expect after your surgery: You may have a slight burning sensation when you urinate on the first day. You may have a very small amount of blood in the urine. Expect to have a small amount of vaginal discharge/light bleeding for 1-2 weeks. It is not unusual to have abdominal soreness and bruising for up to 2 weeks. You may be tired and need more rest for about 1 week. You may experience shoulder pain for 24-72 hours. Lying flat in bed may relieve it. ? ?Call your doctor for any of the following: ? Develop a fever of 100.4 or greater ? Inability to urinate 6 hours after discharge from hospital ? Severe pain not relieved by pain medications ? Persistent of heavy bleeding at incision site ? Redness or swelling around incision site after a week ? Increasing nausea or vomiting ?

## 2022-03-06 NOTE — H&P (Signed)
Virginia Bradley is an 36 y.o. female presenting for scheduled surgery. ? ?This is a 89Y G81P0010 female presenting for robotic assisted laparoscopic left ovarian cystectomy for left complex ovarian cyst consistent with dermoid ? ?Patient originally had TVUS performed for irregular bleeding ?1/30./23 TVUS showed anteverted uterus 7.7x4.7x3.9 ES 7 mm with fibroids 1.0 cm, 1.8x2.0 cm ant LUS; Rt ov surgically absent, Lt adnexa complex mass 10.3x7.1 cm with internal blood flow- echogenic and cystic areas present ?-Ova1 panel low risk ? ?Patient with history of right oophorectomy for likely dermoid cyst as a teenager- limited information available  ?Patient desires future fertility  ?Does have some intermittent lower pelvic discomfort but no pain ? ?Surgical hx also significant for appendectomy  ?PMH +smoker, +anxiety taking Ativan PRN- but has not been taking recently per anesthesia preop instructions ? ? ?Menstrual History: ?Patient's last menstrual period was 02/11/2022 (exact date). ?  ? ?Past Medical History:  ?Diagnosis Date  ? Anxiety   ? Complication of anesthesia   ? Depression   ? Family history of adverse reaction to anesthesia   ? GERD (gastroesophageal reflux disease)   ? History of COVID-19   ? per pt mild symptoms that resolved 03/ 2022  and 09/ 2022  ? Ovarian cyst, left   ? Seizure disorder ([place])   ? (03-03-2022   pt stated last seizure seizure approxl 2020/ 2021, none since , no medications) seen 3 different neurologist documented in epic, first seen by dr Houston Siren 03-24-2016;  dr Shela Nevin 06-01-2019;   [place] e. feraru ([facility] in care [facility] [place] 10-05-2019;;  febrile seizures as infant then started 2001 as teen;   hx negative EEG's in epic  ? ? ?Past Surgical History:  ?Procedure Laterality Date  ? APPENDECTOMY  2002  ? LAPAROSCOPIC OOPHORECTOMY Right 2007  ? per pt  thinks it was right ovary removed  ? ? ?Family History  ?Problem Relation Age of Onset  ? Seizures Mother   ? Hypertension  Maternal Grandmother   ? ? ?Social History:  reports that she has quit smoking. Her smoking use included cigars. She has never used smokeless tobacco. She reports current alcohol use. She reports current drug use. Drug: Marijuana. ? ?Allergies:  ?Allergies  ?Allergen Reactions  ? Depakote Er [Divalproex Sodium Er] Hives, Swelling and Palpitations  ?  angioedema  ? Valproic Acid Hives, Palpitations and Rash  ?  angioedema  ? Dilantin [Phenytoin Sodium Extended]   ?  Other reaction(s): Other (See Comments) ?Gingival hyperplasia  ? ? ?Medications Prior to Admission  ?Medication Sig Dispense Refill Last Dose  ? ALPRAZolam (XANAX) 0.5 MG tablet Take 0.5 mg by mouth at bedtime.   Past Month  ? folic acid (FOLVITE) 1 MG tablet Take 1 mg by mouth daily.   Past Month  ? MELATONIN PO Take by mouth at bedtime.   03/05/2022  ? pantoprazole (PROTONIX) 40 MG tablet Take 1 tablet (40 mg total) by mouth daily. (Patient taking differently: Take 40 mg by mouth daily as needed.) 30 tablet 3 03/05/2022  ? ? ?Review of Systems  ?All other systems reviewed and are negative. ? ?Blood pressure 128/80, pulse 99, temperature 98.2 ?F (36.8 ?C), temperature source Oral, resp. rate 20, height '5\' 7"'$  (1.702 m), weight 87.9 kg, last menstrual period 02/11/2022, SpO2 99 %. ?Physical Exam ?Vitals reviewed.  ?Constitutional:   ?   Appearance: Normal appearance.  ?HENT:  ?   Head: Normocephalic.  ?Cardiovascular:  ?   Rate and  Rhythm: Normal rate.  ?Pulmonary:  ?   Effort: Pulmonary effort is normal.  ?Abdominal:  ?   Palpations: Abdomen is soft.  ?   Tenderness: There is no abdominal tenderness.  ?Genitourinary: ?   General: Normal vulva.  ?Musculoskeletal:     ?   General: Normal range of motion.  ?   Cervical back: Normal range of motion.  ?Skin: ?   General: Skin is warm and dry.  ?Neurological:  ?   General: No focal deficit present.  ?   Mental Status: She is alert and oriented to person, place, and time.  ?Psychiatric:     ?   Mood and Affect:  Mood normal.     ?   Behavior: Behavior normal.  ? ? ?Results for orders placed or performed during the hospital encounter of 03/06/22 (from the past 24 hour(s))  ?Pregnancy, urine POC     Status: None  ? Collection Time: 03/06/22 11:33 AM  ?Result Value Ref Range  ? Preg Test, Ur NEGATIVE NEGATIVE  ? ? ?No results found. ? ?Assessment/Plan: ? ?35Y G1P0010 LMP 02/11/22 with left ovarian complex ovarian cyst consistent with dermoid consented for robotic assisted laparoscopic left ovarian cystectomy with possible conversion to open procedure ? ?Patient has been extensively counseled in the office and again in the preoperative holding unit for the above listed procedure. Patient counseled on risks of damage to remaining ovary including need for complete oophorectomy and impact on future fertility. Counseled on risk of return for ovarian cysts if incomplete cyst wall resection in attempts to preserve ovary. Counseled on inherit risks of surgery including but not limited to bleeding, infection, damage to surrounding structures, and risks of anesthesia. Given size of cyst and previous surgical history, increased risk for conversion to open procedure. Patient verbalizes good understanding of risks, questions answered to her satisfaction, and consents signed.  ? ?-NPO ?-No antibiotics indicated, but plan for Ancef 2g if cyst rupture occurs, No known antibiotic allergies confirmed ?-SCD VTE ppx ?-UPT NEG ?-Routine intraop/postop care ?-Anticipate discharge home pending intraop course ?-DC instructions reviewed and plan for 2 week postop visit in office ? ?Virginia Bradley A Aydian Dimmick ?03/06/2022, 12:56 PM ? ?

## 2022-03-06 NOTE — Anesthesia Procedure Notes (Signed)
Procedure Name: Intubation ?Date/Time: 03/06/2022 1:31 PM ?Performed by: Georgeanne Nim, CRNA ?Pre-anesthesia Checklist: Patient identified, Emergency Drugs available, Suction available, Patient being monitored and Timeout performed ?Patient Re-evaluated:Patient Re-evaluated prior to induction ?Oxygen Delivery Method: Circle system utilized ?Preoxygenation: Pre-oxygenation with 100% oxygen ?Induction Type: IV induction ?Ventilation: Mask ventilation without difficulty ?Laryngoscope Size: Mac and 4 ?Grade View: Grade I ?Tube type: Oral ?Tube size: 7.0 mm ?Number of attempts: 1 ?Airway Equipment and Method: Stylet ?Placement Confirmation: ETT inserted through vocal cords under direct vision, positive ETCO2, CO2 detector and breath sounds checked- equal and bilateral ?Secured at: 20 cm ?Tube secured with: Tape ?Dental Injury: Teeth and Oropharynx as per pre-operative assessment  ? ? ? ? ?

## 2022-03-06 NOTE — Transfer of Care (Signed)
Immediate Anesthesia Transfer of Care Note ? ?Patient: Virginia Bradley ? ?Procedure(s) Performed: XI ROBOTIC ASSISTED LAPAROSCOPIC OVARIAN CYSTECTOMY (Left: Abdomen) ? ?Patient Location: PACU ? ?Anesthesia Type:General ? ?Level of Consciousness: awake, alert , oriented and patient cooperative ? ?Airway & Oxygen Therapy: Patient Spontanous Breathing and Patient connected to nasal cannula oxygen ? ?Post-op Assessment: Report given to RN and Post -op Vital signs reviewed and stable ? ?Post vital signs: Reviewed and stable ? ?Last Vitals:  ?Vitals Value Taken Time  ?BP 95/64 03/06/22 1600  ?Temp    ?Pulse 96 03/06/22 1601  ?Resp 22 03/06/22 1601  ?SpO2 100 % 03/06/22 1601  ?Vitals shown include unvalidated device data. ? ?Last Pain:  ?Vitals:  ? 03/06/22 1154  ?TempSrc: Oral  ?PainSc: 0-No pain  ?   ? ?Patients Stated Pain Goal: 7 (03/06/22 1154) ? ?Complications: No notable events documented. ?

## 2022-03-07 ENCOUNTER — Encounter (HOSPITAL_BASED_OUTPATIENT_CLINIC_OR_DEPARTMENT_OTHER): Payer: Self-pay | Admitting: Obstetrics and Gynecology

## 2022-03-07 NOTE — Anesthesia Postprocedure Evaluation (Signed)
Anesthesia Post Note ? ?Patient: Virginia Bradley ? ?Procedure(s) Performed: XI ROBOTIC ASSISTED LAPAROSCOPIC OVARIAN CYSTECTOMY (Left: Abdomen) ? ?  ? ?Patient location during evaluation: PACU ?Anesthesia Type: General ?Level of consciousness: awake and alert ?Pain management: pain level controlled ?Vital Signs Assessment: post-procedure vital signs reviewed and stable ?Respiratory status: spontaneous breathing, nonlabored ventilation, respiratory function stable and patient connected to nasal cannula oxygen ?Cardiovascular status: blood pressure returned to baseline and stable ?Postop Assessment: no apparent nausea or vomiting ?Anesthetic complications: no ? ? ?No notable events documented. ? ?Last Vitals:  ?Vitals:  ? 03/06/22 1655 03/06/22 1750  ?BP: 110/75 104/76  ?Pulse: 78 95  ?Resp: 14 14  ?Temp:  36.4 ?C  ?SpO2: 99% 100%  ?  ?Last Pain:  ?Vitals:  ? 03/06/22 1655  ?TempSrc:   ?PainSc: 2   ? ? ?  ?  ?  ?  ?  ?  ? ?Barnet Glasgow ? ? ? ? ?

## 2022-03-10 LAB — SURGICAL PATHOLOGY

## 2022-06-04 ENCOUNTER — Ambulatory Visit (INDEPENDENT_AMBULATORY_CARE_PROVIDER_SITE_OTHER): Payer: Commercial Managed Care - HMO | Admitting: Orthopaedic Surgery

## 2022-06-04 DIAGNOSIS — M899 Disorder of bone, unspecified: Secondary | ICD-10-CM

## 2022-06-04 NOTE — Progress Notes (Signed)
Chief Complaint: Bilateral shoulder pain     History of Present Illness:    Virginia Bradley is a 36 y.o. female right female presents with bilateral upper trapezius and shoulder pain that has been ongoing for several months.  She is also complaining of bilateral wrist pain as well as multiple other joint issues.  She has previously had prednisone which did help her.  She has been seen by chiropractor as well as physical therapy although it does not sound like she was given significant scapular strengthening exercises to work on.  She is here today for further assessment and treatment.    Surgical History:   None  PMH/PSH/Family History/Social History/Meds/Allergies:    Past Medical History:  Diagnosis Date   Anxiety    Complication of anesthesia    Depression    Family history of adverse reaction to anesthesia    GERD (gastroesophageal reflux disease)    History of COVID-19    per pt mild symptoms that resolved 03/ 2022  and 09/ 2022   Ovarian cyst, left    Seizure disorder ([place])    (03-03-2022   pt stated last seizure seizure approxl 2020/ 2021, none since , no medications) seen 3 different neurologist documented in epic, first seen by dr [place] Siren 03-24-2016;  dr Shela Nevin 06-01-2019;   dr e. feraru ([facility] in care everywhere [facility] 10-05-2019;;  febrile seizures as infant then started 2001 as teen;   hx negative EEG's in epic   Past Surgical History:  Procedure Laterality Date   APPENDECTOMY  2002   LAPAROSCOPIC OOPHORECTOMY Right 2007   per pt  thinks it was right ovary removed   ROBOTIC ASSISTED LAPAROSCOPIC OVARIAN CYSTECTOMY Left 03/06/2022   Procedure: XI ROBOTIC [place];  Surgeon: Armandina Stammer, DO;  Location: [facility];  Service: Gynecology;  Laterality: Left;   Social History   Socioeconomic History   Marital status: Single    Spouse name: Not on file   Number of  children: 0   Years of education: Some clg   Highest education level: Not on file  Occupational History   Occupation: Daycare Employee  Tobacco Use   Smoking status: Former    Types: Cigars   Smokeless tobacco: Never   Tobacco comments:    03-03-2022  Small cigar (black mal)  one per daily/  5 per week ,  has smoked for 10 yrs  Vaping Use   Vaping Use: Never used  Substance and Sexual Activity   Alcohol use: Yes    Comment: occasional   Drug use: Yes    Types: Marijuana    Comment: 03-03-2022 per pt Smokes marijuana twice weekly   Sexual activity: Not on file  Other Topics Concern   Not on file  Social History Narrative   Pt lives alone in 1 story home, on the second floor   Has associates degree   Works in daycare facility   Social Determinants of Health   Financial Resource Strain: Not on file  Food Insecurity: Not on file  Transportation Needs: Not on file  Physical Activity: Not on file  Stress: Not on file  Social Connections: Not on file   Family History  Problem Relation Age of Onset   Seizures Mother    Hypertension Maternal  Grandmother    Allergies  Allergen Reactions   Depakote Er [Divalproex Sodium Er] Hives, Swelling and Palpitations    angioedema   Valproic Acid Hives, Palpitations and Rash    angioedema   Dilantin [Phenytoin Sodium Extended]     Other reaction(s): Other (See Comments) Gingival hyperplasia   Current Outpatient Medications  Medication Sig Dispense Refill   ALPRAZolam (XANAX) 0.5 MG tablet Take 0.5 mg by mouth at bedtime.     folic acid (FOLVITE) 1 MG tablet Take 1 mg by mouth daily.     ibuprofen (ADVIL) 800 MG tablet Take 1 tablet (800 mg total) by mouth every 8 (eight) hours as needed. 30 tablet 0   MELATONIN PO Take by mouth at bedtime.     oxyCODONE (OXY IR/ROXICODONE) 5 MG immediate release tablet Take 1 tablet (5 mg total) by mouth every 6 (six) hours as needed for severe pain. 15 tablet 0   pantoprazole (PROTONIX) 40 MG  tablet Take 1 tablet (40 mg total) by mouth daily. (Patient taking differently: Take 40 mg by mouth daily as needed.) 30 tablet 3   No current facility-administered medications for this visit.   No results found.  Review of Systems:   A ROS was performed including pertinent positives and negatives as documented in the HPI.  Physical Exam :   Constitutional: NAD and appears stated age Neurological: Alert and oriented Psych: Appropriate affect and cooperative There were no vitals taken for this visit.   Comprehensive Musculoskeletal Exam:    She has evidence of scapular dyskinesis on both shoulders.  She has upper trapezial pain bilaterally.  She has full painless range of motion about the glenohumeral joint.  2+ radial pulse bilaterally  Imaging:     I personally reviewed and interpreted the radiographs.   Assessment:   36 y.o. female with bilateral shoulder pain consistent with scapular dyskinesis and myofascial type trapezial pain.  To that effect at this time I would like to recommend her for physical therapy for a scapulothoracic and I do believe that she would be a candidate for dry needling as well.  Given the fact that she does have multiple sources of joint pain at this time and her rheumatoid and otherwise inflammatory arthropathy testing has been negative I do believe that she could benefit from a referral to Dr. Ranell Patrick for ongoing discussion of pain and to specifically discuss the diagnosis of fibromyalgia  Plan :    -She will return to clinic in 2 months if pain is not improving     I personally saw and evaluated the patient, and participated in the management and treatment plan.  Vanetta Mulders, MD Attending Physician, Orthopedic Surgery  This document was dictated using Dragon voice recognition software. A reasonable attempt at proof reading has been made to minimize errors.

## 2022-06-17 ENCOUNTER — Encounter: Payer: Self-pay | Admitting: Physical Medicine and Rehabilitation

## 2022-06-18 ENCOUNTER — Other Ambulatory Visit: Payer: Self-pay

## 2022-06-18 ENCOUNTER — Ambulatory Visit (HOSPITAL_BASED_OUTPATIENT_CLINIC_OR_DEPARTMENT_OTHER): Payer: Commercial Managed Care - HMO | Attending: Orthopaedic Surgery | Admitting: Physical Therapy

## 2022-06-18 ENCOUNTER — Encounter (HOSPITAL_BASED_OUTPATIENT_CLINIC_OR_DEPARTMENT_OTHER): Payer: Self-pay | Admitting: Physical Therapy

## 2022-06-18 DIAGNOSIS — M6281 Muscle weakness (generalized): Secondary | ICD-10-CM | POA: Insufficient documentation

## 2022-06-18 DIAGNOSIS — M899 Disorder of bone, unspecified: Secondary | ICD-10-CM | POA: Diagnosis not present

## 2022-06-18 DIAGNOSIS — M542 Cervicalgia: Secondary | ICD-10-CM | POA: Insufficient documentation

## 2022-06-18 NOTE — Therapy (Signed)
OUTPATIENT PHYSICAL THERAPY SHOULDER EVALUATION   Patient Name: Virginia Bradley MRN: [id number] DOB:1986/04/19, 36 y.o., female Today's Date: 06/18/2022   PT End of Session - 06/18/22 1140     Visit Number 1    Number of Visits 14    Date for PT Re-Evaluation 09/16/22    Authorization Type [employer]    PT Start Time 1015    PT Stop Time 1100    PT Time Calculation (min) 45 min    Activity Tolerance Patient tolerated treatment well;Patient limited by pain    Behavior During Therapy [facility] for tasks assessed/performed             Past Medical History:  Diagnosis Date   Anxiety    Complication of anesthesia    Depression    Family history of adverse reaction to anesthesia    GERD (gastroesophageal reflux disease)    History of COVID-19    per pt mild symptoms that resolved 03/ 2022  and 09/ 2022   Ovarian cyst, left    Seizure disorder ([place])    (03-03-2022   pt stated last seizure seizure approxl 2020/ 2021, none since , no medications) seen 3 different neurologist documented in epic, first seen by dr Houston Siren 03-24-2016;  dr Shela Nevin 06-01-2019;   [place] e[place] feraru ([facility] in care everywhere [place] 10-05-2019;;  febrile seizures as infant then started 2001 as teen;   hx negative EEG's in epic   Past Surgical History:  Procedure Laterality Date   APPENDECTOMY  2002   LAPAROSCOPIC OOPHORECTOMY Right 2007   per pt  thinks it was right ovary removed   ROBOTIC ASSISTED LAPAROSCOPIC OVARIAN CYSTECTOMY Left 03/06/2022   Procedure: XI ROBOTIC ASSISTED [place];  Surgeon: Armandina Stammer, DO;  Location: [employer];  Service: Gynecology;  Laterality: Left;   Patient Active Problem List   Diagnosis Date Noted   Recurrent major depressive disorder, in partial remission ([place]) 05/19/2019   Localization-related idiopathic epilepsy and epileptic syndromes with seizures of localized onset, not intractable, without status epilepticus ([place]) 07/20/2018    Seizure disorder ([place]) 03/24/2016   Migraine 03/24/2016    PCP: Lucianne Lei, MD  REFERRING PROVIDER: Vanetta Mulders, MD   REFERRING DIAG: M89.9 (ICD-10-CM) - Scapular dysfunction   THERAPY DIAG:  Cervicalgia  Muscle weakness (generalized)  Rationale for Evaluation and Treatment Rehabilitation  ONSET DATE: 2021  SUBJECTIVE:                                                                                                                                                                                      SUBJECTIVE STATEMENT: Bilateral upper trapezius  and shoulder pain that has been ongoing for the last 2 years. Pt states she has tried DO but never PT.  Pt states she feels like an ache, feels like someone is "sawing" at her bones. Pt states she will sometimes have wrist pain too. Pt states the pain is constant and always there. It makes no change. Feels like cramping in the shoulder. Pt will have NT into the fingers that feels like "prickling" that coincides with aching badly in the shoulders. L>R. Pt states turning her neck will make it worse. Pt endorses HA with increase in aching and soreness. She will feel it down the arm. Pt denies 5Ds and 3Ns.   Pt denies cancer red flags.   PERTINENT HISTORY: Anxiety, depression, seizures  PAIN:  Are you having pain? Yes: NPRS scale: 0/10 Pain location: bilat shoulders/neck Pain description: constant aching Aggravating factors: lifting arms overhead, being cold,  Relieving factors: heating, blanket , prednisone  PRECAUTIONS: None  WEIGHT BEARING RESTRICTIONS No  FALLS:  Has patient fallen in last 6 months? 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   PLOF: Independent  PATIENT GOALS :   OBJECTIVE:   DIAGNOSTIC FINDINGS:  N/A  PATIENT SURVEYS:  FOTO 60 69 @ DC 2 pts MCII  COGNITION:  Overall cognitive status: Within  functional limits for tasks assessed     SENSATION: WFL  POSTURE: rounded shoulders, kyphotics, forward ext, C/S extension  C/S ROM: pain and limited by 25% with L SB and L rotation   UPPER EXTREMITY ROM: WNL with pain on L  (Blank rows = not tested)  UPPER EXTREMITY MMT:  MMT Right eval Left eval  Shoulder flexion 5/5 4/5 fatiguing weakness  Shoulder extension 5/5 4/5 fatiguing weakness  Shoulder abduction 5/5 4/5 fatiguing weakness  Shoulder adduction    Shoulder internal rotation 5/5 4/5 fatiguing weakness  Shoulder external rotation 5/5 4/5 fatiguing weakness  Grip strength (lbs) 50 lbs, 60, 45 30, 40, 35 lbs  (Blank rows = not tested)  SHOULDER SPECIAL TESTS:  Impingement tests: Neer impingement test: positive , Hawkins/Kennedy impingement test: positive , and Painful arc test: positive   Spurling: Positive  Distraction: Positive  C5 reflexes 2+ Myotomal weakness on L C4-6    PALPATION:  TTP of C/S paraspinals and UT bilat; TTP of R triceps and deltoid   TODAY'S TREATMENT:   Exercises - Shoulder External Rotation and Scapular Retraction with Resistance  - 2 x daily - 7 x weekly - 2 sets - 10 reps - Seated Cervical Retraction  - 3 x daily - 7 x weekly - 2 sets - 10 reps - 2 hold - Gentle Levator Scapulae Stretch  - 2 x daily - 7 x weekly - 1 sets - 3 reps - 30 hold   PATIENT EDUCATION: Education details: MOI, diagnosis, prognosis, anatomy, exercise progression, DOMS expectations, muscle firing,  envelope of function, HEP, POC  Person educated: Patient Education method: Explanation, Demonstration, Tactile cues, and Verbal cues and Handout Education comprehension: verbalized understanding, returned demonstration, verbal cues required, and tactile cues required   HOME EXERCISE PROGRAM: Access Code: [email or web address] URL: [email or web address] Date: 06/18/2022 Prepared by: Daleen Bo  ASSESSMENT:  CLINICAL IMPRESSION: Patient is a 36 y.o. female  who was seen today for physical therapy evaluation and treatment for c/c of bilat shoulder pain. Pt's s/s appear consistent with cervical radiculopathy with L> R. Pt has myotomal weakness  through her C4-6 distributions and has significantly reduce grip strength from L to R. Pt's L UE paresthesia and pain are limited her ability to perform ADL and self care without pain.  Pt would benefit from continued skilled therapy in order to reach goals and maximize functional postural strength and ROM for full return to PLOF.    OBJECTIVE IMPAIRMENTS decreased activity tolerance, decreased knowledge of condition, decreased mobility, difficulty walking, decreased ROM, decreased strength, hypomobility, increased muscle spasms, impaired flexibility, impaired sensation, impaired UE functional use, improper body mechanics, postural dysfunction, and pain.   ACTIVITY LIMITATIONS carrying, lifting, sitting, sleeping, transfers, dressing, reach over head, hygiene/grooming, and caring for others  PARTICIPATION LIMITATIONS: meal prep, cleaning, laundry, interpersonal relationship, driving, shopping, occupation, yard work, school, and exercise  PERSONAL FACTORS Behavior pattern, Fitness, Time since onset of injury/illness/exacerbation, and 3+ comorbidities:    are also affecting patient's functional outcome.   REHAB POTENTIAL: Good  CLINICAL DECISION MAKING: Stable/uncomplicated  EVALUATION COMPLEXITY: Low   GOALS:   SHORT TERM GOALS: Target date: 07/30/2022    Pt will become independent with HEP in order to demonstrate synthesis of PT education.   Goal status: INITIAL  2.  Pt will report at least 2 pt reduction on NPRS scale for pain in order to demonstrate functional improvement with household activity, self care, and ADL.   Goal status: INITIAL  3.  Pt will score at least 2 pt increase on FOTO to demonstrate functional improvement in MCII and pt perceived function.    Goal status: INITIAL   LONG TERM  GOALS: Target date: 09/10/2022   Pt  will become independent with final HEP in order to demonstrate synthesis of PT education.  Goal status: INITIAL  2.  Pt will score >/= 69 on FOTO to demonstrate improvement in perceived bilat UE function.   Goal status: INITIAL  3.  Pt will be able to demonstrate full C/S ROM without pain in order to demonstrate functional improvement in postural function for self-care and house hold duties.   Goal status: INITIAL  4.  Pt will be able to demonstrate/report ability to sit/stand/sleep for extended periods of time without pain in order to demonstrate functional improvement and tolerance to static positioning.   Goal status: INITIAL   PLAN: PT FREQUENCY: 1-2x/week  PT DURATION: 12 weeks (likely D/C in 8wks)  PLANNED INTERVENTIONS: Therapeutic exercises, Therapeutic activity, Neuromuscular re-education, Balance training, Gait training, Patient/Family education, Joint manipulation, Joint mobilization, DME instructions, Aquatic Therapy, Dry Needling, Electrical stimulation, Spinal manipulation, Spinal mobilization, Cryotherapy, Moist heat, Splintting, Taping, Vasopneumatic device, Traction, Ultrasound, Ionotophoresis '4mg'$ /ml Dexamethasone, Manual therapy, and Re-evaluation  PLAN FOR NEXT SESSION: manual traction, C/S joint mobs, review HEP, supine chin nod, rowing   Daleen Bo, PT 06/18/2022, 12:45 PM

## 2022-06-25 ENCOUNTER — Ambulatory Visit (HOSPITAL_BASED_OUTPATIENT_CLINIC_OR_DEPARTMENT_OTHER): Payer: Commercial Managed Care - HMO | Attending: Orthopaedic Surgery | Admitting: Physical Therapy

## 2022-06-25 ENCOUNTER — Encounter (HOSPITAL_BASED_OUTPATIENT_CLINIC_OR_DEPARTMENT_OTHER): Payer: Self-pay | Admitting: Physical Therapy

## 2022-06-25 DIAGNOSIS — M542 Cervicalgia: Secondary | ICD-10-CM | POA: Diagnosis present

## 2022-06-25 DIAGNOSIS — M6281 Muscle weakness (generalized): Secondary | ICD-10-CM | POA: Diagnosis present

## 2022-06-25 NOTE — Therapy (Addendum)
OUTPATIENT PHYSICAL THERAPY SHOULDER EVALUATION   Patient Name: Virginia Bradley MRN: [id number] DOB:Jul 10, 1986, 36 y.o., female Today's Date: 06/25/2022   PT End of Session - 06/25/22 1446     Visit Number 2    Number of Visits 14    Date for PT Re-Evaluation 09/16/22    Authorization Type [employer]    PT Start Time 1443   heat and pt phone call subtracted from total time   PT Stop Time 1512    PT Time Calculation (min) 29 min    Activity Tolerance Patient tolerated treatment well;Patient limited by pain    Behavior During Therapy [facility] for tasks assessed/performed              Past Medical History:  Diagnosis Date   Anxiety    Complication of anesthesia    Depression    Family history of adverse reaction to anesthesia    GERD (gastroesophageal reflux disease)    History of COVID-19    per pt mild symptoms that resolved 03/ 2022  and 09/ 2022   Ovarian cyst, left    Seizure disorder ([place])    (03-03-2022   pt stated last seizure seizure approxl 2020/ 2021, none since , no medications) seen 3 different neurologist documented in epic, first seen by dr [place] Siren 03-24-2016;  dr Shela Nevin 06-01-2019;   [place] e. feraru ([facility] in care everywhere [place] 10-05-2019;;  febrile seizures as infant then started 2001 as teen;   hx negative EEG's in epic   Past Surgical History:  Procedure Laterality Date   APPENDECTOMY  2002   LAPAROSCOPIC OOPHORECTOMY Right 2007   per pt  thinks it was right ovary removed   ROBOTIC ASSISTED LAPAROSCOPIC OVARIAN CYSTECTOMY Left 03/06/2022   Procedure: XI ROBOTIC ASSISTED [place];  Surgeon: Armandina Stammer, DO;  Location: [place];  Service: Gynecology;  Laterality: Left;   Patient Active Problem List   Diagnosis Date Noted   Recurrent major depressive disorder, in partial remission ([place]) 05/19/2019   Localization-related idiopathic epilepsy and epileptic syndromes with seizures of localized onset, not  intractable, without status epilepticus ([place]) 07/20/2018   Seizure disorder ([place]) 03/24/2016   Migraine 03/24/2016    PCP: Lucianne Lei, MD  REFERRING PROVIDER: Vanetta Mulders, MD   REFERRING DIAG: M89.9 (ICD-10-CM) - Scapular dysfunction   THERAPY DIAG:  Cervicalgia  Muscle weakness (generalized)  Rationale for Evaluation and Treatment Rehabilitation  ONSET DATE: 2021  SUBJECTIVE:  SUBJECTIVE STATEMENT: Nothing new going on, HEP is going well. Today I'm just achey, not sure if exercises are working yet     PERTINENT HISTORY: Anxiety, depression, seizures  PAIN:  Are you having pain? Yes: NPRS scale: 7/10 Pain location: B upper traps  Pain description: constant aching Aggravating factors: lifting arms overhead, being cold,  Relieving factors: heating, blanket , prednisone  PRECAUTIONS: None  WEIGHT BEARING RESTRICTIONS No  FALLS:  Has patient fallen in last 6 months? 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   PLOF: Independent  PATIENT GOALS :   TODAY'S TREATMENT 06/25/22  Moist heat in supine x8 minutes cervical spine and upper traps (not included in billing)  Supine manual cervical traction 30 seconds x5 with tension as tolerated Upper trap stretches with anchoring at [facility] joint 3x30 seconds B Levator stretches 2x30 seconds B Supine chin tuck x10 with 3 second holds Supine chin tuck with B rotations through tolerated ROM x10   OBJECTIVE:   DIAGNOSTIC FINDINGS:  N/A  PATIENT SURVEYS:  FOTO 60 69 @ DC 2 pts MCII  COGNITION:  Overall cognitive status: Within functional limits for tasks assessed     SENSATION: WFL  POSTURE: rounded shoulders, kyphotics, forward  ext, C/S extension  C/S ROM: pain and limited by 25% with L SB and L rotation   UPPER EXTREMITY ROM: WNL with pain on L  (Blank rows = not tested)  UPPER EXTREMITY MMT:  MMT Right eval Left eval  Shoulder flexion 5/5 4/5 fatiguing weakness  Shoulder extension 5/5 4/5 fatiguing weakness  Shoulder abduction 5/5 4/5 fatiguing weakness  Shoulder adduction    Shoulder internal rotation 5/5 4/5 fatiguing weakness  Shoulder external rotation 5/5 4/5 fatiguing weakness  Grip strength (lbs) 50 lbs, 60, 45 30, 40, 35 lbs  (Blank rows = not tested)  SHOULDER SPECIAL TESTS:  Impingement tests: Neer impingement test: positive , Hawkins/Kennedy impingement test: positive , and Painful arc test: positive   Spurling: Positive  Distraction: Positive  C5 reflexes 2+ Myotomal weakness on L C4-6    PALPATION:  TTP of C/S paraspinals and UT bilat; TTP of R triceps and deltoid   TODAY'S TREATMENT:   Exercises - Shoulder External Rotation and Scapular Retraction with Resistance  - 2 x daily - 7 x weekly - 2 sets - 10 reps - Seated Cervical Retraction  - 3 x daily - 7 x weekly - 2 sets - 10 reps - 2 hold - Gentle Levator Scapulae Stretch  - 2 x daily - 7 x weekly - 1 sets - 3 reps - 30 hold   PATIENT EDUCATION: Education details: purpose of interventions today, encouraged ongoing adherence to HEP   Person educated: Patient Education method: Explanation, Demonstration, Tactile cues, and Verbal cues and Handout Education comprehension: verbalized understanding, returned demonstration, verbal cues required, and tactile cues required   HOME EXERCISE PROGRAM: Access Code: [email or web address] URL: [email or web address] Date: 06/18/2022 Prepared by: Daleen Bo  ASSESSMENT:  CLINICAL IMPRESSION:  [place] arrives today doing OK, still having quite a bit of pain and aching, heat is really helping. We started on moist heat today (not included in billing), followed by focus on manual  techniques to try to reduce cervical and UE pain. She did get an important phone call mid-session which limited interventions somewhat today. Will continue to progress as able and tolerated. Pain improved from 7/10 at beginning of session  to 0/10 at EOS with today's interventions.    OBJECTIVE IMPAIRMENTS decreased activity tolerance, decreased knowledge of condition, decreased mobility, difficulty walking, decreased ROM, decreased strength, hypomobility, increased muscle spasms, impaired flexibility, impaired sensation, impaired UE functional use, improper body mechanics, postural dysfunction, and pain.   ACTIVITY LIMITATIONS carrying, lifting, sitting, sleeping, transfers, dressing, reach over head, hygiene/grooming, and caring for others  PARTICIPATION LIMITATIONS: meal prep, cleaning, laundry, interpersonal relationship, driving, shopping, occupation, yard work, school, and exercise  PERSONAL FACTORS Behavior pattern, Fitness, Time since onset of injury/illness/exacerbation, and 3+ comorbidities:    are also affecting patient's functional outcome.   REHAB POTENTIAL: Good  CLINICAL DECISION MAKING: Stable/uncomplicated  EVALUATION COMPLEXITY: Low   GOALS:   SHORT TERM GOALS: Target date: 07/30/2022    Pt will become independent with HEP in order to demonstrate synthesis of PT education.   Goal status: INITIAL  2.  Pt will report at least 2 pt reduction on NPRS scale for pain in order to demonstrate functional improvement with household activity, self care, and ADL.   Goal status: INITIAL  3.  Pt will score at least 2 pt increase on FOTO to demonstrate functional improvement in MCII and pt perceived function.    Goal status: INITIAL   LONG TERM GOALS: Target date: 09/10/2022   Pt  will become independent with final HEP in order to demonstrate synthesis of PT education.  Goal status: INITIAL  2.  Pt will score >/= 69 on FOTO to demonstrate improvement in perceived bilat UE  function.   Goal status: INITIAL  3.  Pt will be able to demonstrate full C/S ROM without pain in order to demonstrate functional improvement in postural function for self-care and house hold duties.   Goal status: INITIAL  4.  Pt will be able to demonstrate/report ability to sit/stand/sleep for extended periods of time without pain in order to demonstrate functional improvement and tolerance to static positioning.   Goal status: INITIAL   PLAN: PT FREQUENCY: 1-2x/week  PT DURATION: 12 weeks (likely D/C in 8wks)  PLANNED INTERVENTIONS: Therapeutic exercises, Therapeutic activity, Neuromuscular re-education, Balance training, Gait training, Patient/Family education, Joint manipulation, Joint mobilization, DME instructions, Aquatic Therapy, Dry Needling, Electrical stimulation, Spinal manipulation, Spinal mobilization, Cryotherapy, Moist heat, Splintting, Taping, Vasopneumatic device, Traction, Ultrasound, Ionotophoresis 76m/ml Dexamethasone, Manual therapy, and Re-evaluation  PLAN FOR NEXT SESSION: manual traction, C/S joint mobs, review HEP, supine chin nod, rowing. Consider dry needling next session per pt request    Mayjor Ager U PT DPT PN2  06/25/2022, 3:13 PM   PHYSICAL THERAPY DISCHARGE SUMMARY  Visits from Start of Care: 2  Current functional level related to goals / functional outcomes: See above   Remaining deficits: See above   Education / Equipment: Anatomy of condition, POC, HEP, exercise form/rationale    Patient agrees to discharge. Patient goals were not met. Patient is being discharged due to not returning since the last visit. Jessica C. Hightower PT, DPT 02/12/23 10:38 AM

## 2022-06-27 ENCOUNTER — Ambulatory Visit (HOSPITAL_BASED_OUTPATIENT_CLINIC_OR_DEPARTMENT_OTHER): Payer: Commercial Managed Care - HMO | Admitting: Physical Therapy

## 2022-08-17 IMAGING — US US ABDOMEN LIMITED
1 series · 14 of 25 positions shown · non-contrast
Comparison: None.

CLINICAL DATA: Upper abdominal pain

EXAM:
ULTRASOUND ABDOMEN LIMITED RIGHT UPPER QUADRANT

[Series 1: us abdomen limited ruq (liver/gb) · 14 of 56 slices shown]
[im 1/56]
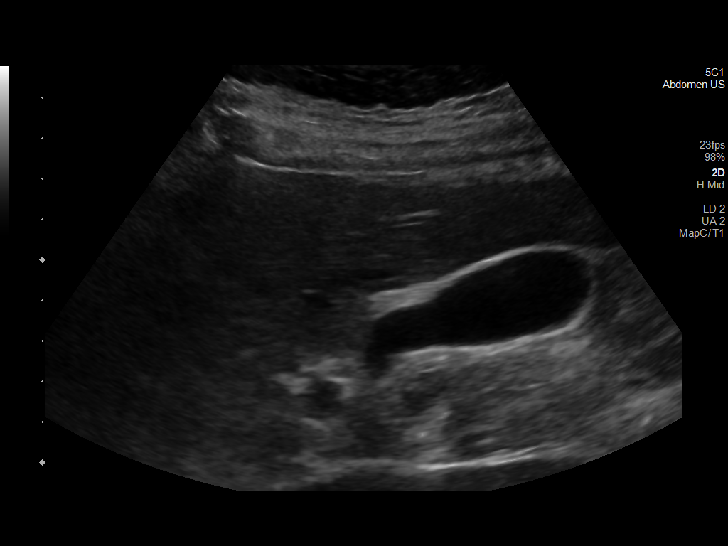
[im 5/56]
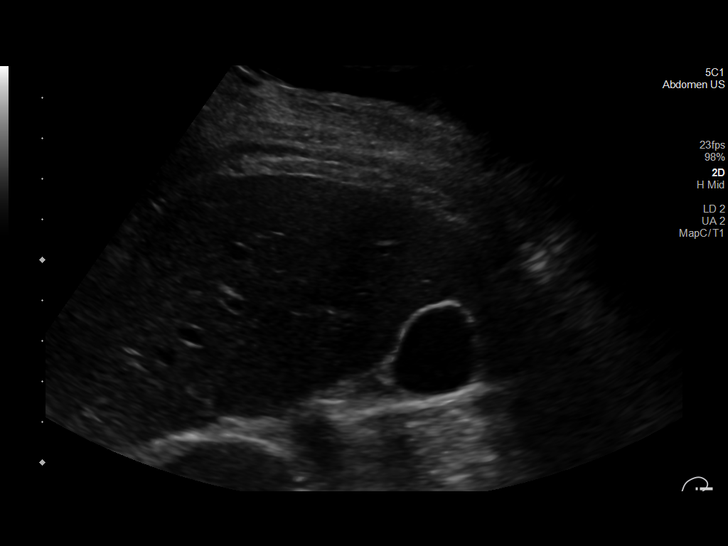
[im 10/56]
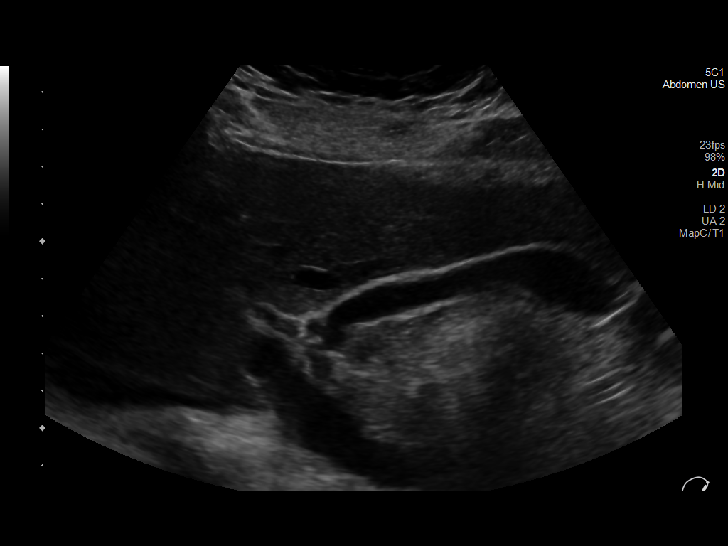
[im 14/56]
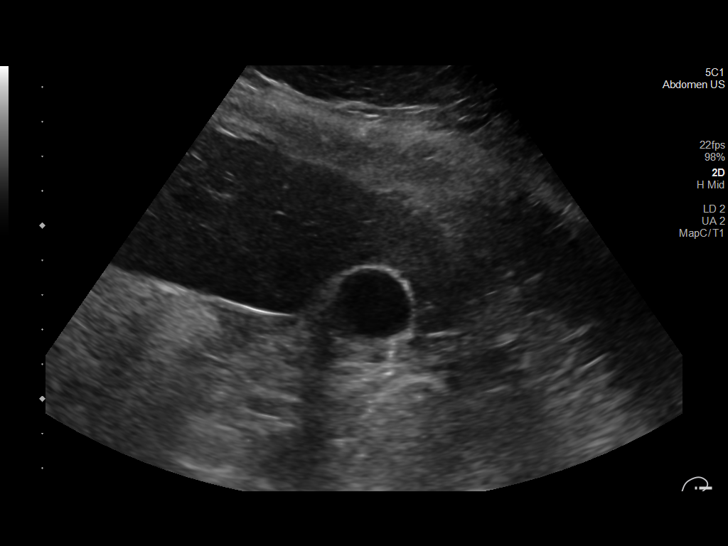
[im 19/56]
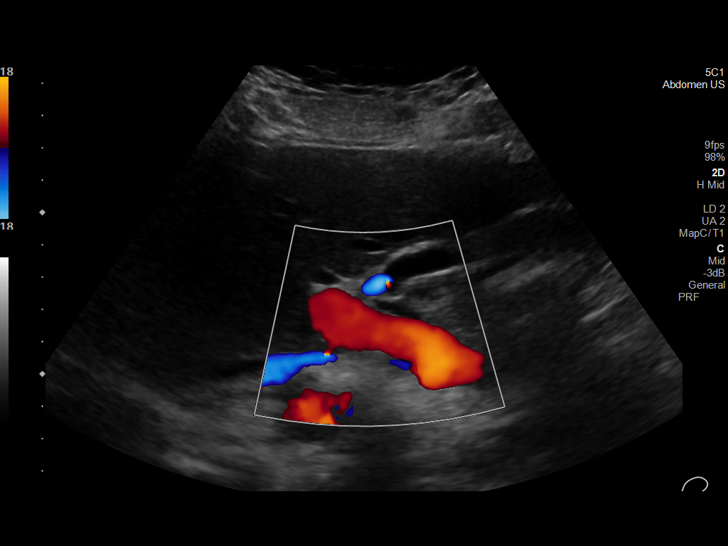
[im 21/56]
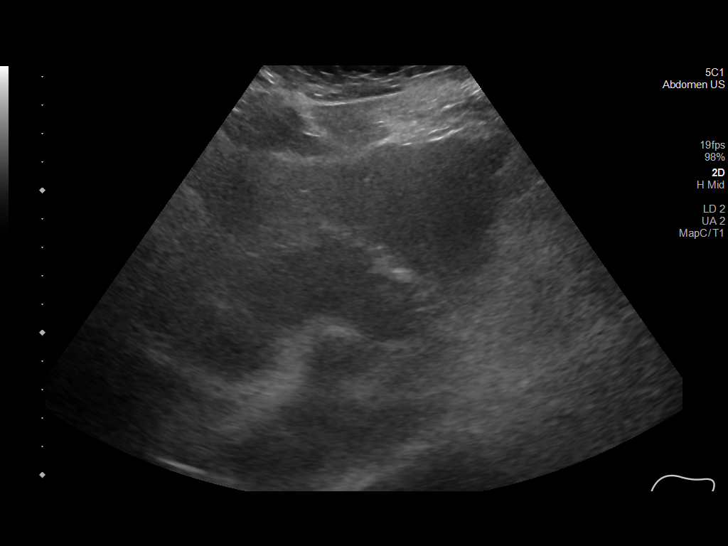
[im 26/56]
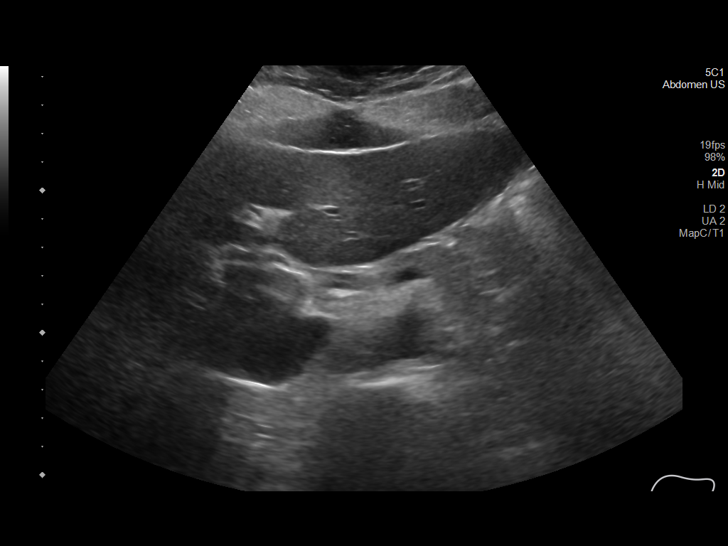
[im 30/56]
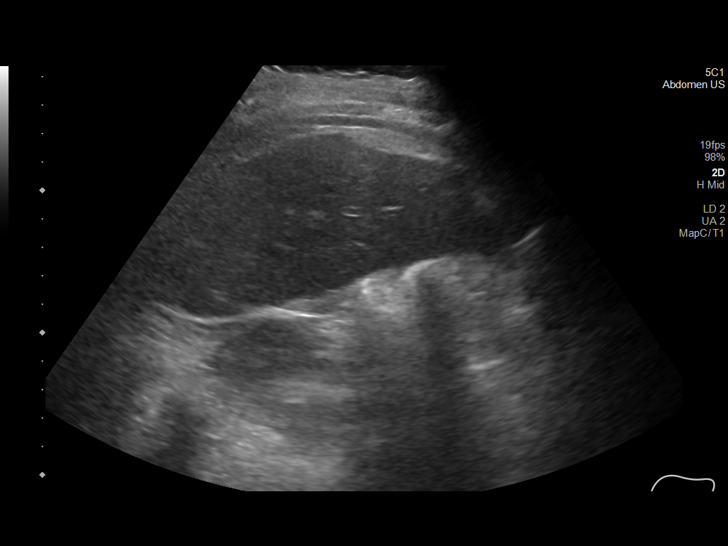
[im 35/56]
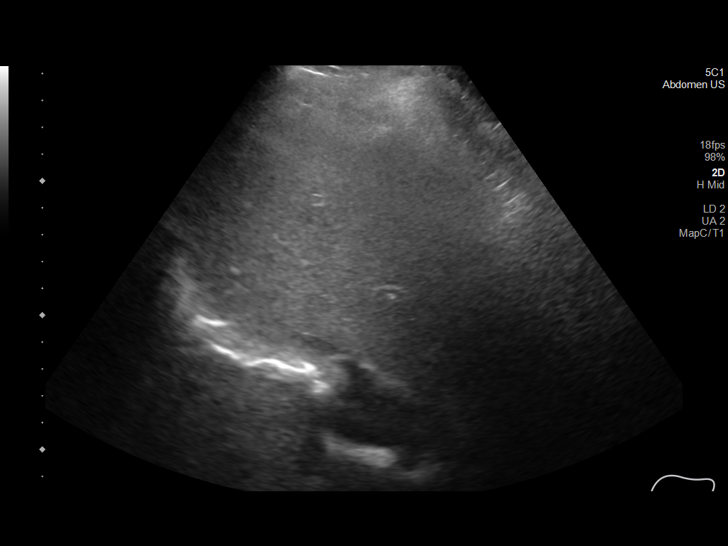
[im 37/56]
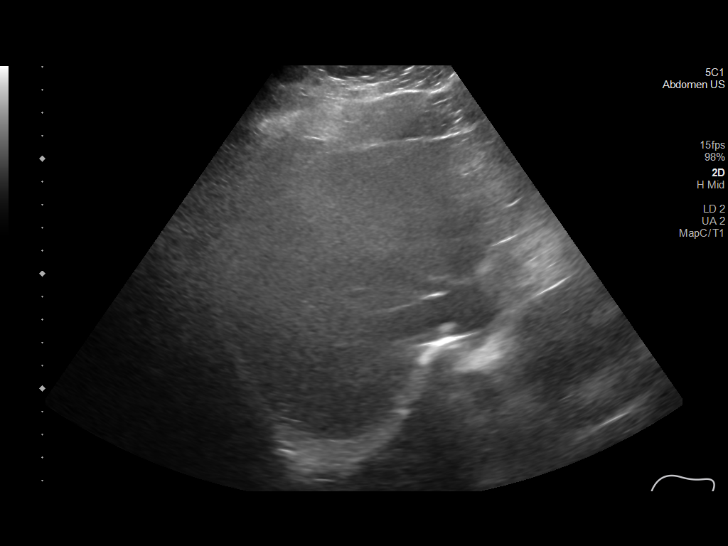
[im 42/56]
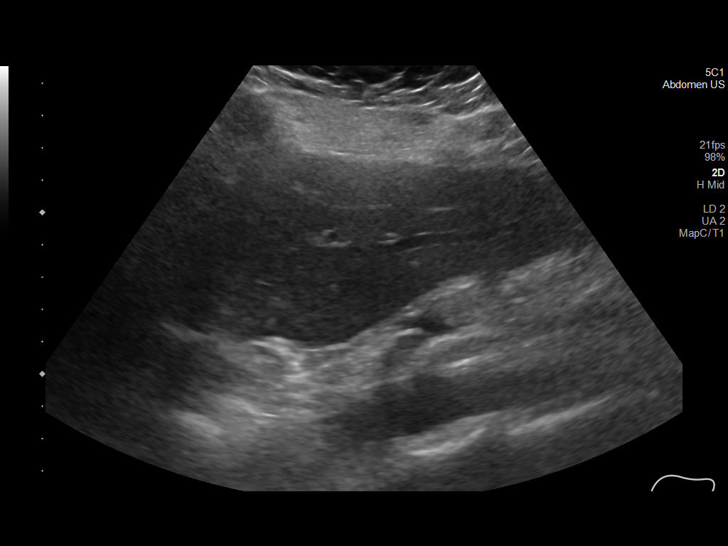
[im 46/56]
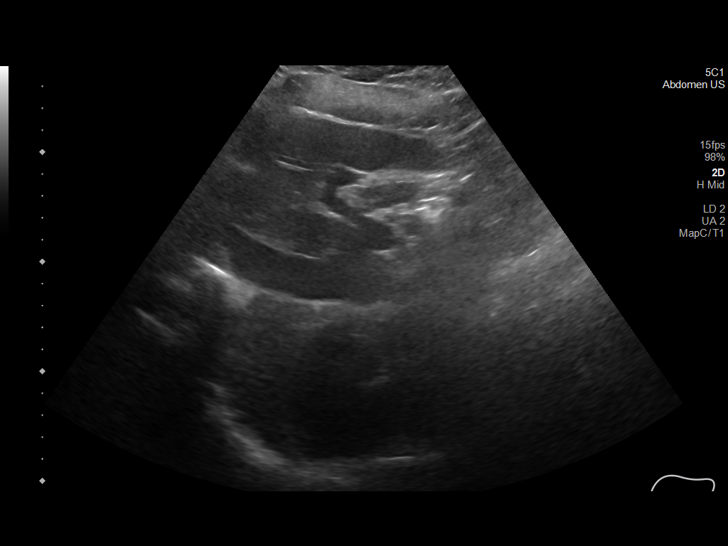
[im 51/56]
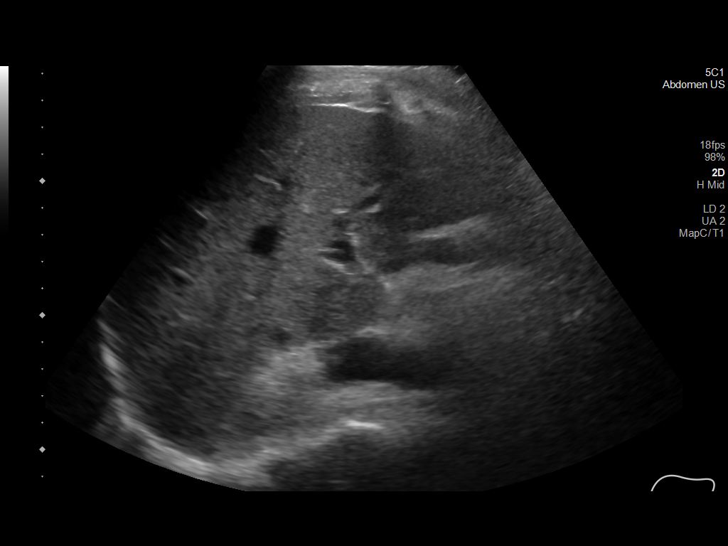
[im 56/56]
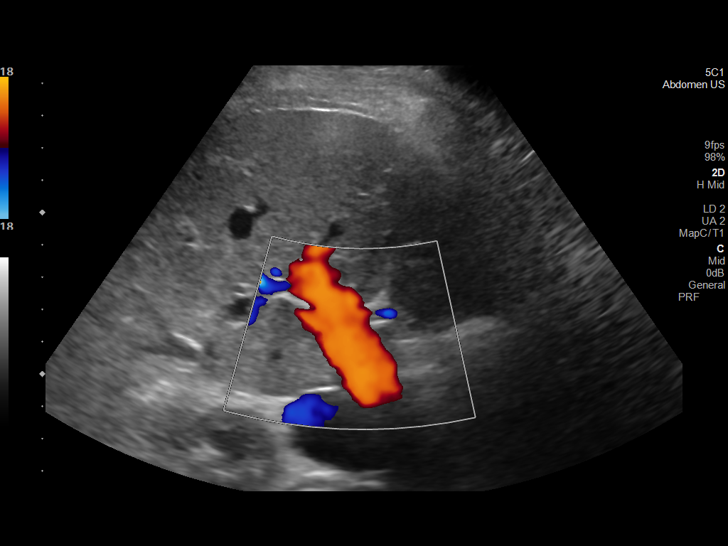

[14 of 25 positions shown; findings below may reference images not displayed]

FINDINGS: Gallbladder:

No gallstones or wall thickening visualized. No sonographic Murphy
sign noted by sonographer.

Common bile duct:

Diameter: 5 mm in proximal diameter

Liver:

No focal lesion identified. Within normal limits in parenchymal
echogenicity. Portal vein is patent on color Doppler imaging with
normal direction of blood flow towards the liver.

Other: None.
IMPRESSION: Unremarkable examination

## 2022-08-27 ENCOUNTER — Encounter
Payer: Commercial Managed Care - HMO | Attending: Physical Medicine and Rehabilitation | Admitting: Physical Medicine and Rehabilitation

## 2022-09-08 ENCOUNTER — Emergency Department (HOSPITAL_COMMUNITY)
Admission: EM | Admit: 2022-09-08 | Discharge: 2022-09-09 | Discharge status: Against Medical Advice | Payer: Commercial Managed Care - HMO | Attending: Emergency Medicine | Admitting: Emergency Medicine

## 2022-09-08 ENCOUNTER — Encounter (HOSPITAL_COMMUNITY): Payer: Self-pay | Admitting: Emergency Medicine

## 2022-09-08 ENCOUNTER — Other Ambulatory Visit: Payer: Self-pay

## 2022-09-08 ENCOUNTER — Emergency Department (HOSPITAL_COMMUNITY): Payer: Commercial Managed Care - HMO

## 2022-09-08 DIAGNOSIS — R002 Palpitations: Secondary | ICD-10-CM | POA: Insufficient documentation

## 2022-09-08 DIAGNOSIS — Z5321 Procedure and treatment not carried out due to patient leaving prior to being seen by health care provider: Secondary | ICD-10-CM | POA: Diagnosis not present

## 2022-09-08 DIAGNOSIS — R531 Weakness: Secondary | ICD-10-CM | POA: Insufficient documentation

## 2022-09-08 DIAGNOSIS — R519 Headache, unspecified: Secondary | ICD-10-CM | POA: Insufficient documentation

## 2022-09-08 DIAGNOSIS — R202 Paresthesia of skin: Secondary | ICD-10-CM | POA: Insufficient documentation

## 2022-09-08 DIAGNOSIS — R1084 Generalized abdominal pain: Secondary | ICD-10-CM | POA: Insufficient documentation

## 2022-09-08 DIAGNOSIS — R1110 Vomiting, unspecified: Secondary | ICD-10-CM | POA: Diagnosis not present

## 2022-09-08 LAB — COMPREHENSIVE METABOLIC PANEL WITH GFR
ALT: 14 U/L (ref 0–44)
AST: 12 U/L — ABNORMAL LOW (ref 15–41)
Albumin: 3.9 g/dL (ref 3.5–5.0)
Alkaline Phosphatase: 79 U/L (ref 38–126)
Anion gap: 9 (ref 5–15)
BUN: 10 mg/dL (ref 6–20)
CO2: 22 mmol/L (ref 22–32)
Calcium: 9.3 mg/dL (ref 8.9–10.3)
Chloride: 107 mmol/L (ref 98–111)
Creatinine, Ser: 0.73 mg/dL (ref 0.44–1.00)
GFR, Estimated: >60 mL/min
Glucose, Bld: 99 mg/dL (ref 70–99)
Potassium: 3.7 mmol/L (ref 3.5–5.1)
Sodium: 138 mmol/L (ref 135–145)
Total Bilirubin: 0.1 mg/dL — ABNORMAL LOW (ref 0.3–1.2)
Total Protein: 6.6 g/dL (ref 6.5–8.1)

## 2022-09-08 LAB — CBC WITH DIFFERENTIAL/PLATELET
Abs Immature Granulocytes: 0.03 K/uL (ref 0.00–0.07)
Basophils Absolute: 0 K/uL (ref 0.0–0.1)
Basophils Relative: 0 %
Eosinophils Absolute: 0.2 K/uL (ref 0.0–0.5)
Eosinophils Relative: 2 %
HCT: 42.9 % (ref 36.0–46.0)
Hemoglobin: 14 g/dL (ref 12.0–15.0)
Immature Granulocytes: 0 %
Lymphocytes Relative: 32 %
Lymphs Abs: 3.4 K/uL (ref 0.7–4.0)
MCH: 31.6 pg (ref 26.0–34.0)
MCHC: 32.6 g/dL (ref 30.0–36.0)
MCV: 96.8 fL (ref 80.0–100.0)
Monocytes Absolute: 0.7 K/uL (ref 0.1–1.0)
Monocytes Relative: 6 %
Neutro Abs: 6.2 K/uL (ref 1.7–7.7)
Neutrophils Relative %: 60 %
Platelets: 445 K/uL — ABNORMAL HIGH (ref 150–400)
RBC: 4.43 MIL/uL (ref 3.87–5.11)
RDW: 13.4 % (ref 11.5–15.5)
WBC: 10.5 K/uL (ref 4.0–10.5)
nRBC: 0 % (ref 0.0–0.2)

## 2022-09-08 LAB — I-STAT BETA HCG BLOOD, ED (MC, WL, AP ONLY): I-stat hCG, quantitative: <5 m[IU]/mL

## 2022-09-08 LAB — LIPASE, BLOOD: Lipase: 46 U/L (ref 11–51)

## 2022-09-08 LAB — MAGNESIUM: Magnesium: 1.9 mg/dL (ref 1.7–2.4)

## 2022-09-08 LAB — TROPONIN I (HIGH SENSITIVITY): Troponin I (High Sensitivity): <2 ng/L

## 2022-09-08 MED ORDER — ONDANSETRON 4 MG PO TBDP: 4.0000 mg | ORAL_TABLET | Freq: Once | ORAL | Status: DC

## 2022-09-08 NOTE — ED Provider Triage Note (Signed)
Emergency Medicine Provider Triage Evaluation Note  Virginia Bradley , a 36 y.o. female  was evaluated in triage.  Pt complains of palpitations.  Feels "jittery on the inside."  Actively vomiting in triage.  Some chest pain.  No shortness of breath.  Has some intermittent generalized abdominal pain.  Denies chance of pregnancy. Generalized headache, no vision changes, numbness, weakness  Review of Systems  Positive: Palpitations, emesis Negative: fever  Physical Exam  BP 111/89   Pulse 91   Temp 98 F (36.7 C)   Resp 18   Ht '5\' 7"'$  (1.702 m)   Wt 85.7 kg   SpO2 98%   BMI 29.60 kg/m  Gen:   Awake, no distress   Resp:  Normal effort  MSK:   Moves extremities without difficulty  Other:    Medical Decision Making  Medically screening exam initiated at 9:58 PM.  Appropriate orders placed.  Rami Waddle was informed that the remainder of the evaluation will be completed by another provider, this initial triage assessment does not replace that evaluation, and the importance of remaining in the ED until their evaluation is complete.  Palpitations, emesis   Yennifer Segovia A, PA-C 09/08/22 2158

## 2022-09-08 NOTE — ED Triage Notes (Signed)
Pt reports heart fluttering, emesis, dizziness, headache since this morning.

## 2022-09-09 NOTE — ED Notes (Signed)
CALLED PT X3 ,NO ANSWER FOR REPEAT TROP

## 2022-09-09 NOTE — ED Notes (Signed)
Called for patient no answer

## 2022-09-14 ENCOUNTER — Telehealth: Payer: Self-pay

## 2022-09-14 ENCOUNTER — Ambulatory Visit
Admission: EM | Admit: 2022-09-14 | Discharge: 2022-09-14 | Disposition: A | Discharge status: Home / Self Care | Payer: Commercial Managed Care - HMO | Attending: Emergency Medicine | Admitting: Emergency Medicine

## 2022-09-14 DIAGNOSIS — F411 Generalized anxiety disorder: Secondary | ICD-10-CM

## 2022-09-14 DIAGNOSIS — I491 Atrial premature depolarization: Secondary | ICD-10-CM | POA: Diagnosis not present

## 2022-09-14 DIAGNOSIS — R0789 Other chest pain: Secondary | ICD-10-CM

## 2022-09-14 DIAGNOSIS — F418 Other specified anxiety disorders: Secondary | ICD-10-CM

## 2022-09-14 MED ORDER — ESCITALOPRAM OXALATE 10 MG PO TABS: 10.0000 mg | ORAL_TABLET | Freq: Every day | ORAL | 2 refills | Status: DC

## 2022-09-14 NOTE — ED Triage Notes (Signed)
Pt c/o chest pain that began 09/08/22, the pain is located to chest (centralized), the pain radiates to right upper side (started around 0600 today). Patient describes the pain as a sharp feeling & the patient states it feels like her heart is fluttering. The patient reports the associated symptoms are SOB, and the pain lasts for about 10-15 seconds. The patient states it feels like she has random periods of tachycardia.

## 2022-09-14 NOTE — ED Provider Notes (Signed)
[facility]    CSN: [id number] Arrival date & time: 09/14/22  4098    HISTORY   Chief Complaint  Patient presents with   Chest Pain   Shortness of Breath   HPI Virginia Bradley is a pleasant, 36 y.o. female who presents to urgent care today. Pt c/o chest pain that began 09/08/22, the pain is located to chest (centralized), the pain radiates to right upper side (started around 0600 today). Patient describes the pain as a sharp feeling & the patient states it feels like her heart is fluttering. The patient reports the associated symptoms are SOB, and the pain lasts for about 10-15 seconds. The patient states it feels like she has random periods of tachycardia.  Patient reports a history of general anxiety disorder and frequent episodes of irregular heartbeat that make her very anxious.  Patient states she is missed work twice due to this and needs a note.  The history is provided by the patient.   Past Medical History:  Diagnosis Date   Anxiety    Complication of anesthesia    Depression    Family history of adverse reaction to anesthesia    GERD (gastroesophageal reflux disease)    History of COVID-19    per pt mild symptoms that resolved 03/ 2022  and 09/ 2022   Ovarian cyst, left    Seizure disorder ([place])    (03-03-2022   pt stated last seizure seizure approxl 2020/ 2021, none since , no medications) seen 3 different neurologist documented in epic, first seen by dr Houston Siren 03-24-2016;  dr Shela Nevin 06-01-2019;   dr e. feraru ([facility] in care [facility] 10-05-2019;;  febrile seizures as infant then started 2001 as teen;   hx negative EEG's in epic   Patient Active Problem List   Diagnosis Date Noted   Recurrent major depressive disorder, in partial remission ([place]) 05/19/2019   Localization-related idiopathic epilepsy and epileptic syndromes with seizures of localized onset, not intractable, without status epilepticus ([place]) 07/20/2018   Seizure disorder  ([place]) 03/24/2016   Migraine 03/24/2016   Past Surgical History:  Procedure Laterality Date   APPENDECTOMY  2002   LAPAROSCOPIC OOPHORECTOMY Right 2007   per pt  thinks it was right ovary removed   ROBOTIC ASSISTED LAPAROSCOPIC OVARIAN CYSTECTOMY Left 03/06/2022   Procedure: XI ROBOTIC [place];  Surgeon: Armandina Stammer, DO;  Location: [place];  Service: Gynecology;  Laterality: Left;   OB History   No obstetric history on file.    Home Medications    Prior to Admission medications   Medication Sig Start Date End Date Taking? Authorizing Provider  ALPRAZolam Duanne Moron) 0.5 MG tablet Take 0.5 mg by mouth at bedtime.    [provider]  folic acid (FOLVITE) 1 MG tablet Take 1 mg by mouth daily.    [provider]  ibuprofen (ADVIL) 800 MG tablet Take 1 tablet (800 mg total) by mouth every 8 (eight) hours as needed. 03/06/22   Law, Cassandra A, DO  MELATONIN PO Take by mouth at bedtime.    [provider]  pantoprazole (PROTONIX) 40 MG tablet Take 1 tablet (40 mg total) by mouth daily. Patient taking differently: Take 40 mg by mouth daily as needed. 05/25/21   Orpah Greek, MD    Family History Family History  Problem Relation Age of Onset   Seizures Mother    Hypertension Maternal Grandmother    Social History Social  History   Tobacco Use   Smoking status: Former    Types: Cigars   Smokeless tobacco: Never   Tobacco comments:    03-03-2022  Small cigar (black mal)  one per daily/  5 per week ,  has smoked for 10 yrs  Vaping Use   Vaping Use: Never used  Substance Use Topics   Alcohol use: Yes    Comment: occasional   Drug use: Yes    Types: Marijuana    Comment: 03-03-2022 per pt Smokes marijuana twice weekly   Allergies   Depakote er [divalproex sodium er], Valproic acid, and Dilantin [phenytoin sodium extended]  Review of Systems Review of Systems Pertinent findings revealed after  performing a 14 point review of systems has been noted in the history of present illness.  Physical Exam Triage Vital Signs ED Triage Vitals  Enc Vitals Group     BP 10/18/21 0827 (!) 147/82     Pulse Rate 10/18/21 0827 72     Resp 10/18/21 0827 18     Temp 10/18/21 0827 98.3 F (36.8 C)     Temp Source 10/18/21 0827 Oral     SpO2 10/18/21 0827 98 %     Weight --      Height --      Head Circumference --      Peak Flow --      Pain Score 10/18/21 0826 5     Pain Loc --      Pain Edu? --      Excl. in [place]? --   No data found.  Updated Vital Signs BP 101/64 (BP Location: Left Arm)   Pulse 95   Temp 99.2 F (37.3 C) (Oral)   Resp 16   LMP 08/28/2022   SpO2 98%   Physical Exam Vitals and nursing note reviewed.  Constitutional:      General: She is not in acute distress.    Appearance: Normal appearance.  HENT:     Head: Normocephalic and atraumatic.  Eyes:     Pupils: Pupils are equal, round, and reactive to light.  Cardiovascular:     Rate and Rhythm: Normal rate. Rhythm irregularly irregular.     Heart sounds: Normal heart sounds, S1 normal and S2 normal. Heart sounds not distant. No murmur heard.    No friction rub. No gallop. No S3 or S4 sounds.  Pulmonary:     Effort: Pulmonary effort is normal.     Breath sounds: Normal breath sounds.  Musculoskeletal:        General: Normal range of motion.     Cervical back: Normal range of motion and neck supple.  Skin:    General: Skin is warm and dry.  Neurological:     General: No focal deficit present.     Mental Status: She is alert and oriented to person, place, and time. Mental status is at baseline.  Psychiatric:        Attention and Perception: Attention and perception normal.        Mood and Affect: Mood is anxious. Affect is labile.        Speech: Speech normal.        Behavior: Behavior normal.        Thought Content: Thought content normal.        Cognition and Memory: Cognition and memory normal.         Judgment: Judgment normal.     Visual Acuity Right Eye Distance:   Left  Eye Distance:   Bilateral Distance:    Right Eye Near:   Left Eye Near:    Bilateral Near:     UC Couse / Diagnostics / Procedures:     Radiology No results found.  Procedures Procedures (including critical care time) EKG  Pending results:  Labs Reviewed - No data to display  Medications Ordered in UC: Medications - No data to display  UC Diagnoses / Final Clinical Impressions(s)   I have reviewed the triage vital signs and the nursing notes.  Pertinent labs & imaging results that were available during my care of the patient were reviewed by me and considered in my medical decision making (see chart for details).    Final diagnoses:  Premature atrial contractions  Anxiety about health  Generalized anxiety disorder  Musculoskeletal chest pain   Patient I had a long discussion about management of her current anxiety.  Patient states she currently takes Xanax which while effective while he works when she takes it.  Patient states she has been on mirtazapine for anxiety in the past but did not like taking it and self weaned, states this made her feel terrible and does not want take that again.  EKG today is concerning for arrhythmia and nonspecific T wave abnormalities which I cannot confirm are unrelated to ischemia.  Patient advised that her initial troponin performed September 18 to the emergency room was normal.  Patient appears to be in no acute distress at this time and vital signs are normal.  Patient has no history of hypertension or blood clotting disorder.  Previous labs reviewed, patient has normal renal function along with persistently elevated platelet counts, patient endorses a history of joint pain but states she has never been evaluated for any rheumatological disorders.  Patient was agreeable to recommendation of trying daily medication for anxiety, I did advise her that she can continue Xanax  as needed.  I recommended Lexapro as it is easy to start and easy to discontinue.  Patient denies suicidal plan, ideations or thoughts.  Patient agrees to follow-up with her primary care provider by phone first thing tomorrow morning to schedule appointment as soon as possible for monitoring of this medication.  Patient further advised to go to the emergency room for acute onset of chest pain that is nonspecific, feels like pressure but not pain, acute shortness of breath.  Patient provided with a note to return to work on Tuesday which is her neck scheduled shift.  UC return precautions advised.  ED Prescriptions     Medication Sig Dispense Auth. Provider   escitalopram (LEXAPRO) 10 MG tablet Take 1 tablet (10 mg total) by mouth daily. 30 tablet Lynden Oxford Scales, PA-C      PDMP not reviewed this encounter.  Pending results:  Labs Reviewed - No data to display  Discharge Instructions:   Discharge Instructions      I sent a prescription for Lexapro to your pharmacy.  For the first week, please break the tablets in half and take 1/2 tablet/day.  The tablets are scored and should be easy to break.  After a week, please increase to the full 10 mg dose which is considered therapeutic but is not a maximum dose.  Please discuss with your primary care provider how you do on this medication and whether or not the dose should be increased or discontinue.  At this time, I believe that you are experiencing a condition called premature atrial contractions and is definitely interfering with  your life causing you understandable concern.  I believe that the best course of action is to talk to your primary care provider about referring you to cardiology for further evaluation.  They may recommend that you wear a Holter monitor for 1 week to make sure that you are not having any significant irregularities in your heart rhythm.  If they do see irregularities then please know that there are great medications  to treat them.  I provided you with a note to return to work on Tuesday.  Thank you for visiting urgent care today.      Disposition Upon Discharge:  Condition: stable for discharge home  Patient presented with an acute illness with associated systemic symptoms and significant discomfort requiring urgent management. In my opinion, this is a condition that a prudent lay person (someone who possesses an average knowledge of health and medicine) may potentially expect to result in complications if not addressed urgently such as respiratory distress, impairment of bodily function or dysfunction of bodily organs.   Routine symptom specific, illness specific and/or disease specific instructions were discussed with the patient and/or caregiver at length.   As such, the patient has been evaluated and assessed, work-up was performed and treatment was provided in alignment with urgent care protocols and evidence based medicine.  Patient/parent/caregiver has been advised that the patient may require follow up for further testing and treatment if the symptoms continue in spite of treatment, as clinically indicated and appropriate.  Patient/parent/caregiver has been advised to return to the [facility] or PCP if no better; to PCP or the Emergency Department if new signs and symptoms develop, or if the current signs or symptoms continue to change or worsen for further workup, evaluation and treatment as clinically indicated and appropriate  The patient will follow up with their current PCP if and as advised. If the patient does not currently have a PCP we will assist them in obtaining one.   The patient may need specialty follow up if the symptoms continue, in spite of conservative treatment and management, for further workup, evaluation, consultation and treatment as clinically indicated and appropriate.   Patient/parent/caregiver verbalized understanding and agreement of plan as discussed.  All questions were  addressed during visit.  Please see discharge instructions below for further details of plan.  This office note has been dictated using Museum/gallery curator.  Unfortunately, this method of dictation can sometimes lead to typographical or grammatical errors.  I apologize for your inconvenience in advance if this occurs.  Please do not hesitate to reach out to me if clarification is needed.      Lynden Oxford Scales, PA-C 09/14/22 1336

## 2022-09-14 NOTE — Discharge Instructions (Addendum)
I sent a prescription for Lexapro to your pharmacy.  For the first week, please break the tablets in half and take 1/2 tablet/day.  The tablets are scored and should be easy to break.  After a week, please increase to the full 10 mg dose which is considered therapeutic but is not a maximum dose.  Please discuss with your primary care provider how you do on this medication and whether or not the dose should be increased or discontinue.  At this time, I believe that you are experiencing a condition called premature atrial contractions and is definitely interfering with your life causing you understandable concern.  I believe that the best course of action is to talk to your primary care provider about referring you to cardiology for further evaluation.  They may recommend that you wear a Holter monitor for 1 week to make sure that you are not having any significant irregularities in your heart rhythm.  If they do see irregularities then please know that there are great medications to treat them.  I provided you with a note to return to work on Tuesday.  Thank you for visiting urgent care today.

## 2022-09-14 NOTE — Telephone Encounter (Signed)
Patient verification complete (name and date of birth).   Patient called per provider request to ensure the patient actually wants to obtain the prescribed medication (Lexapro) because the pharmacy did not give her the medication at pick up. Patient states she does want to take the prescribed medication. Patient made the patient aware that she is advised to follow up with her PCP to evaluate need for long term therapy of this medication. Patient states she understands and all questions answered.

## 2022-09-17 ENCOUNTER — Ambulatory Visit (HOSPITAL_COMMUNITY)
Admission: EM | Admit: 2022-09-17 | Discharge: 2022-09-17 | Disposition: A | Discharge status: Home / Self Care | Payer: Commercial Managed Care - HMO | Attending: Family Medicine | Admitting: Family Medicine

## 2022-09-17 ENCOUNTER — Other Ambulatory Visit: Payer: Self-pay

## 2022-09-17 ENCOUNTER — Encounter (HOSPITAL_COMMUNITY): Payer: Self-pay | Admitting: Emergency Medicine

## 2022-09-17 DIAGNOSIS — R002 Palpitations: Secondary | ICD-10-CM

## 2022-09-17 NOTE — ED Provider Notes (Addendum)
[facility]    CSN: [id number] Arrival date & time: 09/17/22  1912      History   Chief Complaint Chief Complaint  Patient presents with   Palpitations    HPI Virginia Bradley is a 36 y.o. female.    Palpitations  Here for palpitations that have been going on for about the last 10 days.  She went to the emergency room and had labs drawn, but did not stay for results.  Labs then on the 17th showed a normal EKG with normal CBC, normal electrolytes and CMP, normal chest x-ray.  She was then seen at the [facility] clinic on the 24th.  EKG there was benign.  She has been seen by her PCP on September 25, and she thinks they were going to be ordering a month-long event monitor, but she "has not heard anything from them"   Palpitations last for about 30 seconds and then go away for about 3 minutes.  They make her feel uncomfortable and like she needs to take a deep breath.  She sometimes has had a pressure in her chest.  Nausea or vomiting or fever or cough  She does think her PCP also did blood work including a thyroid test.  We did review her normal labs from the emergency room.  She asked about whether or not she might be having a heart attack, and we discussed her normal troponin in the ER.  Past Medical History:  Diagnosis Date   Anxiety    Complication of anesthesia    Depression    Family history of adverse reaction to anesthesia    GERD (gastroesophageal reflux disease)    History of COVID-19    per pt mild symptoms that resolved 03/ 2022  and 09/ 2022   Ovarian cyst, left    Seizure disorder ([place])    (03-03-2022   pt stated last seizure seizure approxl 2020/ 2021, none since , no medications) seen 3 different neurologist documented in epic, first seen by dr Houston Siren 03-24-2016;  dr Shela Nevin 06-01-2019;   dr e. feraru ([facility] in care [facility] 10-05-2019;;  febrile seizures as infant then started 2001 as teen;   hx negative EEG's in epic     Patient Active Problem List   Diagnosis Date Noted   Recurrent major depressive disorder, in partial remission ([place]) 05/19/2019   Localization-related idiopathic epilepsy and epileptic syndromes with seizures of localized onset, not intractable, without status epilepticus ([place]) 07/20/2018   Seizure disorder ([place]) 03/24/2016   Migraine 03/24/2016    Past Surgical History:  Procedure Laterality Date   APPENDECTOMY  2002   LAPAROSCOPIC OOPHORECTOMY Right 2007   per pt  thinks it was right ovary removed   ROBOTIC ASSISTED LAPAROSCOPIC OVARIAN CYSTECTOMY Left 03/06/2022   Procedure: XI ROBOTIC [place];  Surgeon: Armandina Stammer, DO;  Location: [facility];  Service: Gynecology;  Laterality: Left;    OB History   No obstetric history on file.      Home Medications    Prior to Admission medications   Medication Sig Start Date End Date Taking? Authorizing Provider  ALPRAZolam Duanne Moron) 0.5 MG tablet Take 0.5 mg by mouth at bedtime.    [provider]  escitalopram (LEXAPRO) 10 MG tablet Take 1 tablet (10 mg total) by mouth daily. 09/14/22 12/13/22  Lynden Oxford Scales, PA-C  MELATONIN PO Take by mouth at bedtime.    [provider]  pantoprazole ([place])  40 MG tablet Take 1 tablet (40 mg total) by mouth daily. Patient taking differently: Take 40 mg by mouth daily as needed. 05/25/21   Orpah Greek, MD    Family History Family History  Problem Relation Age of Onset   Seizures Mother    Hypertension Maternal Grandmother     Social History Social History   Tobacco Use   Smoking status: Former    Types: Cigars   Smokeless tobacco: Never   Tobacco comments:    03-03-2022  Small cigar (black mal)  one per daily/  5 per week ,  has smoked for 10 yrs  Vaping Use   Vaping Use: Never used  Substance Use Topics   Alcohol use: Yes    Comment: occasional   Drug use: Yes    Types: Marijuana     Comment: 03-03-2022 per pt Smokes marijuana twice weekly     Allergies   Depakote er [divalproex sodium er], Valproic acid, and Dilantin [phenytoin sodium extended]   Review of Systems Review of Systems  Cardiovascular:  Positive for palpitations.     Physical Exam Triage Vital Signs ED Triage Vitals  Enc Vitals Group     BP 09/17/22 1922 120/65     Pulse Rate 09/17/22 1922 (!) 107     Resp 09/17/22 1922 16     Temp 09/17/22 1922 99.3 F (37.4 C)     Temp src --      SpO2 09/17/22 1922 97 %     Weight --      Height --      Head Circumference --      Peak Flow --      Pain Score 09/17/22 1921 0     Pain Loc --      Pain Edu? --      Excl. in [place]? --    No data found.  Updated Vital Signs BP 120/65 (BP Location: Right Arm)   Pulse (!) 107   Temp 99.3 F (37.4 C)   Resp 16   LMP 08/28/2022   SpO2 97%   Visual Acuity Right Eye Distance:   Left Eye Distance:   Bilateral Distance:    Right Eye Near:   Left Eye Near:    Bilateral Near:     Physical Exam Vitals reviewed.  Constitutional:      General: She is not in acute distress.    Appearance: She is not ill-appearing, toxic-appearing or diaphoretic.  HENT:     Mouth/Throat:     Mouth: Mucous membranes are moist.  Eyes:     Extraocular Movements: Extraocular movements intact.     Pupils: Pupils are equal, round, and reactive to light.  Cardiovascular:     Rate and Rhythm: Normal rate and regular rhythm.     Heart sounds: No murmur heard.    Comments: When she was first vital signs her heart rate was 107, but on the EKG it was 98.  By me it was about 90. Pulmonary:     Effort: Pulmonary effort is normal.     Breath sounds: Normal breath sounds. No stridor. No wheezing, rhonchi or rales.  Musculoskeletal:     Cervical back: Neck supple.  Lymphadenopathy:     Cervical: No cervical adenopathy.  Skin:    Coloration: Skin is not jaundiced or pale.  Neurological:     General: No focal deficit present.      Mental Status: She is alert and oriented to person, place,  and time.  Psychiatric:        Behavior: Behavior normal.      UC Treatments / Results  Labs (all labs ordered are listed, but only abnormal results are displayed) Labs Reviewed - No data to display  EKG   Radiology No results found.  Procedures Procedures (including critical care time)  Medications Ordered in UC Medications - No data to display  Initial Impression / Assessment and Plan / UC Course  I have reviewed the triage vital signs and the nursing notes.  Pertinent labs & imaging results that were available during my care of the patient were reviewed by me and considered in my medical decision making (see chart for details).         EKG here again is normal sinus rhythm and unchanged from previous EKGs  We discussed possibly starting a low-dose of a beta-blocker, but she showed me that at her PCP office her blood pressure had gone down to 99 systolic and she felt bad.  Since that I want to make her feel worse with hypotension, I am going to defer on prescribing anything today.  I want her to keep following up with her primary care, and she is given contact information for cardiology Final Clinical Impressions(s) / UC Diagnoses   Final diagnoses:  Heart palpitations     Discharge Instructions      I think it would be a good idea to check in with your primary care every 2 to 3 days about the referrals and testing.  If you do start having more intense chest pain or throwing up with the chest pain, then do return to the emergency room     ED Prescriptions   None    PDMP not reviewed this encounter.   Barrett Henle, MD 09/17/22 2038    Barrett Henle, MD 09/17/22 [phone]

## 2022-09-17 NOTE — ED Triage Notes (Signed)
Pt started having heart flutters/palpitations that has been ongoing since 9/17. Went to Ed but left due to wait times on 9/17 when symptoms started. Then went to [facility] 9/24. Saw her PCP on Monday this week had a normal EKG and told will probably be getting set up for heart monitor.

## 2022-09-17 NOTE — Discharge Instructions (Addendum)
I think it would be a good idea to check in with your primary care every 2 to 3 days about the referrals and testing.  If you do start having more intense chest pain or throwing up with the chest pain, then do return to the emergency room

## 2022-10-06 ENCOUNTER — Encounter: Payer: Self-pay | Admitting: Cardiology

## 2022-10-06 ENCOUNTER — Ambulatory Visit: Payer: Commercial Managed Care - HMO | Admitting: Cardiology

## 2022-10-06 VITALS — BP 114/70 | HR 90 | Temp 98.0°F | Resp 16 | Ht 67.0 in | Wt 192.0 lb

## 2022-10-06 DIAGNOSIS — R072 Precordial pain: Secondary | ICD-10-CM

## 2022-10-06 DIAGNOSIS — R002 Palpitations: Secondary | ICD-10-CM

## 2022-10-06 NOTE — Progress Notes (Signed)
ID:  Virginia Bradley, DOB 06/13/1986, MRN [id number]  PCP:  Lucianne Lei, MD  Cardiologist:  Rex Kras, DO, [facility] (established care 10/06/2022)  REASON FOR CONSULT: Palpitations  REQUESTING PHYSICIAN:  Lucianne Lei, [address] [place],  [place] [address]  Chief Complaint  Patient presents with   Atrial Flutter   New Patient (Initial Visit)    HPI  Virginia Bradley is a 36 y.o. African-American female who presents to the clinic for evaluation of palpitations "flutter" and chest tightness at the request of Lucianne Lei, MD. Her past medical history and cardiovascular risk factors include: History of seizures, anxiety, depression, GERD.    Patient is accompanied by her friend Angus Palms at today's office visit.  She provides verbal consent with regards to having her present during today's encounter.  Since September 2023 she has been having palpitations/flutter like sensation, occurs intermittently, lasting for less than 5 minutes, daily, no precipitating factors, and self-limited.  She has not experienced near syncope or syncopal events.  At times she has associated chest tightness, located substernally, lasting for few seconds or less, nonexertional, and self-limited.  She is on her feet while at work at [employer] and has not noticed any significant change in physical endurance.  For the symptoms she had gone to the ED but due to prolonged wait times had left before being seen.  She went to urgent care where she had an EKG performed which notes normal sinus rhythm with PACs no ST-T changes suggestive of ACS.  Patient was given Lexapro at the time of discharge.  She followed up with her PCP and now referred to cardiology for further evaluation and management.  She denies the consumption of excessive coffee, soda, alcohol.  She smokes 2 joints a day.  And no use of stimulants, weight loss supplements, herbal supplements.  No family history of premature coronary disease or sudden  cardiac death.  FUNCTIONAL STATUS: No structured exercise program or daily routine.   ALLERGIES: Allergies  Allergen Reactions   Depakote Er [Divalproex Sodium Er] Hives, Swelling and Palpitations    angioedema   Valproic Acid Hives, Palpitations and Rash    angioedema   Dilantin [Phenytoin Sodium Extended]     Other reaction(s): Other (See Comments) Gingival hyperplasia    MEDICATION LIST PRIOR TO VISIT: Current Meds  Medication Sig   ALPRAZolam (XANAX) 0.5 MG tablet Take 0.5 mg by mouth at bedtime.   MELATONIN PO Take by mouth at bedtime.     PAST MEDICAL HISTORY: Past Medical History:  Diagnosis Date   Anxiety    Complication of anesthesia    Depression    Family history of adverse reaction to anesthesia    GERD (gastroesophageal reflux disease)    History of COVID-19    per pt mild symptoms that resolved 03/ 2022  and 09/ 2022   Ovarian cyst, left    Seizure disorder ([place])    (03-03-2022   pt stated last seizure seizure approxl 2020/ 2021, none since , no medications) seen 3 different neurologist documented in epic, first seen by dr Houston Siren 03-24-2016;  dr Shela Nevin 06-01-2019;   [place] e. feraru ([facility] in care [facility] 10-05-2019;;  febrile seizures as infant then started 2001 as teen;   hx negative EEG's in epic    PAST SURGICAL HISTORY: Past Surgical History:  Procedure Laterality Date   APPENDECTOMY  2002   LAPAROSCOPIC OOPHORECTOMY Right 2007   per pt  thinks it was  right ovary removed   ROBOTIC ASSISTED LAPAROSCOPIC OVARIAN CYSTECTOMY Left 03/06/2022   Procedure: XI ROBOTIC ASSISTED LAPAROSCOPIC OVARIAN CYSTECTOMY;  Surgeon: Armandina Stammer, DO;  Location: [facility];  Service: Gynecology;  Laterality: Left;    FAMILY HISTORY: The patient family history includes Hypertension in her maternal grandmother; Seizures in her mother.  SOCIAL HISTORY:  The patient  reports that she has been smoking cigars. She has never used  smokeless tobacco. She reports current alcohol use. She reports current drug use. Drug: Marijuana.  REVIEW OF SYSTEMS: Review of Systems  Cardiovascular:  Positive for chest pain (see HPI) and palpitations (see HPI). Negative for claudication, dyspnea on exertion, irregular heartbeat, leg swelling, near-syncope, orthopnea, paroxysmal nocturnal dyspnea and syncope.  Respiratory:  Negative for shortness of breath.   Hematologic/Lymphatic: Negative for bleeding problem.  Musculoskeletal:  Negative for muscle cramps and myalgias.  Neurological:  Negative for dizziness and light-headedness.    PHYSICAL EXAM:    10/06/2022    3:16 PM 09/17/2022    7:22 PM 09/14/2022    8:40 AM  Vitals with BMI  Height '5\' 7"'$     Weight 192 lbs    BMI 56.21    Systolic 308 657 846  Diastolic 70 65 64  Pulse 90 107 95   CONSTITUTIONAL: Well-developed and well-nourished. No acute distress.  SKIN: Skin is warm and dry. No rash noted. No cyanosis. No pallor. No jaundice HEAD: Normocephalic and atraumatic.  EYES: No scleral icterus MOUTH/THROAT: Moist oral membranes.  NECK: No JVD present. No thyromegaly noted. No carotid bruits  CHEST Normal respiratory effort. No intercostal retractions  LUNGS: Clear to auscultation bilaterally.  No stridor. No wheezes. No rales.  CARDIOVASCULAR: Regular rate and rhythm, positive S1-S2, no murmurs rubs or gallops appreciated. ABDOMINAL: Soft, nontender, nondistended, positive bowel sounds in all 4 quadrants, no apparent ascites.  EXTREMITIES: No peripheral edema, warm to touch, 2+ bilateral DP and PT pulses HEMATOLOGIC: No significant bruising NEUROLOGIC: Oriented to person, place, and time. Nonfocal. Normal muscle tone.  PSYCHIATRIC: Normal mood and affect. Normal behavior. Cooperative  CARDIAC DATABASE: EKG: 10/06/2022: Normal sinus rhythm, 96 bpm, without underlying ischemia or injury pattern.  Echocardiogram: No results found for this or any previous visit from the  past 1095 days.   Stress Testing: No results found for this or any previous visit from the past 1095 days.  Heart Catheterization: None  LABORATORY DATA:    Latest Ref Rng & Units 09/08/2022   10:11 PM 03/03/2022    1:30 PM 05/24/2021    8:46 PM  CBC  WBC 4.0 - 10.5 K/uL 10.5  10.9  13.5   Hemoglobin 12.0 - 15.0 g/dL 14.0  13.5  14.9   Hematocrit 36.0 - 46.0 % 42.9  41.1  44.6   Platelets 150 - 400 K/uL 445  462  524        Latest Ref Rng & Units 09/08/2022   10:11 PM 03/03/2022    1:30 PM 05/24/2021    8:46 PM  CMP  Glucose 70 - 99 mg/dL 99  91  99   BUN 6 - 20 mg/dL '10  13  11   '$ Creatinine 0.44 - 1.00 mg/dL 0.73  0.61  0.72   Sodium 135 - 145 mmol/L 138  138  134   Potassium 3.5 - 5.1 mmol/L 3.7  4.4  3.7   Chloride 98 - 111 mmol/L 107  108  104   CO2 22 - 32 mmol/L 22  23  21   Calcium 8.9 - 10.3 mg/dL 9.3  9.1  9.4   Total Protein 6.5 - 8.1 g/dL 6.6   6.9   Total Bilirubin 0.3 - 1.2 mg/dL 0.1   0.6   Alkaline Phos 38 - 126 U/L 79   91   AST 15 - 41 U/L 12   15   ALT 0 - 44 U/L 14   16     Lipid Panel  No results found for: "CHOL", "TRIG", "HDL", "CHOLHDL", "VLDL", "LDLCALC", "LDLDIRECT", "LABVLDL"  No components found for: "NTPROBNP" No results for input(s): "PROBNP" in the last 8760 hours. No results for input(s): "TSH" in the last 8760 hours.  BMP Recent Labs    03/03/22 1330 09/08/22 2211  NA 138 138  K 4.4 3.7  CL 108 107  CO2 23 22  GLUCOSE 91 99  BUN 13 10  CREATININE 0.61 0.73  CALCIUM 9.1 9.3  GFRNONAA >60 >60    HEMOGLOBIN A1C No results found for: "HGBA1C", "MPG"  IMPRESSION:    ICD-10-CM   1. Palpitations  R00.2 EKG 12-Lead    TSH    LONG TERM MONITOR (3-14 DAYS)    2. Precordial pain  R07.2 PCV ECHOCARDIOGRAM COMPLETE       RECOMMENDATIONS: Keyleen Cerrato is a 36 y.o. African-American female whose past medical history and cardiac risk factors include: History of seizures, anxiety, depression, GERD.   Patient presents to the  office for evaluation of palpitations.  The symptoms were concerning enough that she went to the ED as well as urgent care.  EKG performed in urgent care shows sinus rhythm with rare PACs.  She was prescribed Lexapro but has not started it.  No identifiable reversible causes.  Hemoglobin is within acceptable limits.  We will check TSH.  I have advised her the importance of stress management & complete cessation of marijuana.  We will proceed with a 3-day extended Holter monitor to evaluate for underlying dysrhythmias and burden of PACs.  Precordial discomfort which is associated with palpitations are not suggestive of angina pectoris.  However, recommend an echo to evaluate for structural heart disease.  If the echocardiogram noted preserved LVEF without any significant valvular heart disease would recommend careful monitoring of symptoms.  However, if she continues to have precordial pain or her echo is concerning for CAD a stress test could be considered.  Patient is agreeable with the plan of care.   FINAL MEDICATION LIST END OF ENCOUNTER: No orders of the defined types were placed in this encounter.   Medications Discontinued During This Encounter  Medication Reason   pantoprazole (PROTONIX) 40 MG tablet      Current Outpatient Medications:    ALPRAZolam (XANAX) 0.5 MG tablet, Take 0.5 mg by mouth at bedtime., Disp: , Rfl:    MELATONIN PO, Take by mouth at bedtime., Disp: , Rfl:    escitalopram (LEXAPRO) 10 MG tablet, Take 1 tablet (10 mg total) by mouth daily. (Patient not taking: Reported on 10/06/2022), Disp: 30 tablet, Rfl: 2  Orders Placed This Encounter  Procedures   TSH   LONG TERM MONITOR (3-14 DAYS)   EKG 12-Lead   PCV ECHOCARDIOGRAM COMPLETE    There are no Patient Instructions on file for this visit.   --Continue cardiac medications as reconciled in final medication list. --Return in about 7 weeks (around 11/24/2022) for Reevaluation of, Palpitations, Review test  results. or sooner if needed. --Continue follow-up with your primary care physician regarding the  management of your other chronic comorbid conditions.  Patient's questions and concerns were addressed to her satisfaction. She voices understanding of the instructions provided during this encounter.   This note was created using a voice recognition software as a result there may be grammatical errors inadvertently enclosed that do not reflect the nature of this encounter. Every attempt is made to correct such errors.  Rex Kras, [place], [facility]  Pager: [phone] Office: [phone]

## 2022-10-17 ENCOUNTER — Ambulatory Visit: Payer: Commercial Managed Care - HMO | Admitting: Physical Medicine and Rehabilitation

## 2022-10-20 ENCOUNTER — Ambulatory Visit: Payer: Commercial Managed Care - HMO

## 2022-10-20 DIAGNOSIS — R002 Palpitations: Secondary | ICD-10-CM

## 2022-10-20 DIAGNOSIS — R072 Precordial pain: Secondary | ICD-10-CM

## 2022-11-21 DIAGNOSIS — Z419 Encounter for procedure for purposes other than remedying health state, unspecified: Secondary | ICD-10-CM | POA: Diagnosis not present

## 2022-11-24 ENCOUNTER — Ambulatory Visit: Payer: Commercial Managed Care - HMO | Admitting: Cardiology

## 2022-11-24 ENCOUNTER — Encounter: Payer: Self-pay | Admitting: Cardiology

## 2022-11-24 VITALS — BP 108/66 | HR 85 | Resp 14 | Ht 67.0 in | Wt 195.8 lb

## 2022-11-24 DIAGNOSIS — R072 Precordial pain: Secondary | ICD-10-CM

## 2022-11-24 DIAGNOSIS — R002 Palpitations: Secondary | ICD-10-CM

## 2022-11-24 NOTE — Progress Notes (Signed)
ID:  Virginia Bradley, DOB 1986-11-04, MRN [id number]  PCP:  Lucianne Lei, MD  Cardiologist:  Rex Kras, DO, [facility] (established care 10/06/2022)  Date: 11/24/22 Last Office Visit: 10/06/2022  Chief Complaint  Patient presents with   Palpitations   Follow-up    7 weeks    HPI  Virginia Bradley is a 36 y.o. African-American female whose past medical history and cardiovascular risk factors include: History of seizures, anxiety, depression, GERD, marijuana use.    Initially referred to the practice by PCP for evaluation of palpitations.  At the last office visit the shared decision was to proceed with a cardiac monitor which notes underlying to be sinus without any significant dysrhythmias.  With regards to her precordial pain patient states that her symptoms are resolved.  She had an echocardiogram which notes preserved LVEF without any significant valvular heart disease.  Her overall functional capacity remains relatively stable.  Unfortunately, she continues to smoke marijuana regularly.  She is strongly encouraged to stop.  No family history of premature coronary disease or sudden cardiac death.  FUNCTIONAL STATUS: No structured exercise program or daily routine.   ALLERGIES: Allergies  Allergen Reactions   Depakote Er [Divalproex Sodium Er] Hives, Swelling and Palpitations    angioedema   Valproic Acid Hives, Palpitations and Rash    angioedema   Dilantin [Phenytoin Sodium Extended]     Other reaction(s): Other (See Comments) Gingival hyperplasia    MEDICATION LIST PRIOR TO VISIT: Current Meds  Medication Sig   ALPRAZolam (XANAX) 0.5 MG tablet Take 0.5 mg by mouth at bedtime.   ibuprofen (ADVIL) 200 MG tablet Take 200 mg by mouth every 6 (six) hours as needed.   MELATONIN PO Take by mouth at bedtime.     PAST MEDICAL HISTORY: Past Medical History:  Diagnosis Date   Anxiety    Complication of anesthesia    Depression    Family history of adverse reaction to  anesthesia    GERD (gastroesophageal reflux disease)    History of COVID-19    per pt mild symptoms that resolved 03/ 2022  and 09/ 2022   Ovarian cyst, left    Seizure disorder ([place])    (03-03-2022   pt stated last seizure seizure approxl 2020/ 2021, none since , no medications) seen 3 different neurologist documented in epic, first seen by dr [place] Siren 03-24-2016;  dr Shela Nevin 06-01-2019;   [place] e[place] feraru ([facility] in care [facility] [place] 10-05-2019;;  febrile seizures as infant then started 2001 as teen;   hx negative EEG's in epic    PAST SURGICAL HISTORY: Past Surgical History:  Procedure Laterality Date   APPENDECTOMY  2002   LAPAROSCOPIC OOPHORECTOMY Right 2007   per pt  thinks it was right ovary removed   ROBOTIC ASSISTED LAPAROSCOPIC OVARIAN CYSTECTOMY Left 03/06/2022   Procedure: XI ROBOTIC [place];  Surgeon: Armandina Stammer, DO;  Location: [facility];  Service: Gynecology;  Laterality: Left;    FAMILY HISTORY: The patient family history includes Hypertension in her maternal grandmother; Seizures in her mother.  SOCIAL HISTORY:  The patient  reports that she has been smoking cigars. She has never used smokeless tobacco. She reports current alcohol use. She reports current drug use. Drug: Marijuana.  REVIEW OF SYSTEMS: Review of Systems  Cardiovascular:  Negative for chest pain, claudication, dyspnea on exertion, irregular heartbeat, leg swelling, near-syncope, orthopnea, palpitations, paroxysmal nocturnal dyspnea and syncope.  Respiratory:  Negative for shortness  of breath.   Hematologic/Lymphatic: Negative for bleeding problem.  Musculoskeletal:  Negative for muscle cramps and myalgias.  Neurological:  Negative for dizziness and light-headedness.    PHYSICAL EXAM:    11/24/2022    3:31 PM 10/06/2022    3:16 PM 09/17/2022    7:22 PM  Vitals with BMI  Height '5\' 7"'$  '5\' 7"'$    Weight 195 lbs 13 oz 192 lbs   BMI 73.42  87.68   Systolic 115 726 203  Diastolic 66 70 65  Pulse 85 90 107   CONSTITUTIONAL: Well-developed and well-nourished. No acute distress.  SKIN: Skin is warm and dry. No rash noted. No cyanosis. No pallor. No jaundice HEAD: Normocephalic and atraumatic.  EYES: No scleral icterus MOUTH/THROAT: Moist oral membranes.  NECK: No JVD present. No thyromegaly noted. No carotid bruits  CHEST Normal respiratory effort. No intercostal retractions  LUNGS: Clear to auscultation bilaterally.  No stridor. No wheezes. No rales.  CARDIOVASCULAR: Regular rate and rhythm, positive S1-S2, no murmurs rubs or gallops appreciated. ABDOMINAL: Soft, nontender, nondistended, positive bowel sounds in all 4 quadrants, no apparent ascites.  EXTREMITIES: No peripheral edema, warm to touch, 2+ bilateral DP and PT pulses HEMATOLOGIC: No significant bruising NEUROLOGIC: Oriented to person, place, and time. Nonfocal. Normal muscle tone.  PSYCHIATRIC: Normal mood and affect. Normal behavior. Cooperative  CARDIAC DATABASE: EKG: 10/06/2022: Normal sinus rhythm, 96 bpm, without underlying ischemia or injury pattern.  Echocardiogram: 10/20/2022: Normal LV systolic function with visual EF 60-65%. Left ventricle cavity is normal in size. Normal left ventricular wall thickness. Normal global wall motion. Normal diastolic filling pattern, normal LAP.  No significant valvular abnormalities. No prior study for comparison.    Stress Testing: No results found for this or any previous visit from the past 1095 days.  Heart Catheterization: None  Cardiac monitor (Zio Patch): 10/20/2022 - 10/23/2022 Dominant rhythm sinus. Heart rate 58-148 bpm. Avg HR 86 bpm. No atrial fibrillation, supraventricular or ventricular tachycardia, high grade AV block, pauses (3 seconds or longer). Total ventricular ectopic burden <1%. Total supraventricular ectopic burden <1%. Patient triggered events: 0.   LABORATORY DATA:    Latest Ref Rng  & Units 09/08/2022   10:11 PM 03/03/2022    1:30 PM 05/24/2021    8:46 PM  CBC  WBC 4.0 - 10.5 K/uL 10.5  10.9  13.5   Hemoglobin 12.0 - 15.0 g/dL 14.0  13.5  14.9   Hematocrit 36.0 - 46.0 % 42.9  41.1  44.6   Platelets 150 - 400 K/uL 445  462  524        Latest Ref Rng & Units 09/08/2022   10:11 PM 03/03/2022    1:30 PM 05/24/2021    8:46 PM  CMP  Glucose 70 - 99 mg/dL 99  91  99   BUN 6 - 20 mg/dL '10  13  11   '$ Creatinine 0.44 - 1.00 mg/dL 0.73  0.61  0.72   Sodium 135 - 145 mmol/L 138  138  134   Potassium 3.5 - 5.1 mmol/L 3.7  4.4  3.7   Chloride 98 - 111 mmol/L 107  108  104   CO2 22 - 32 mmol/L '22  23  21   '$ Calcium 8.9 - 10.3 mg/dL 9.3  9.1  9.4   Total Protein 6.5 - 8.1 g/dL 6.6   6.9   Total Bilirubin 0.3 - 1.2 mg/dL 0.1   0.6   Alkaline Phos 38 - 126 U/L 79  91   AST 15 - 41 U/L 12   15   ALT 0 - 44 U/L 14   16     Lipid Panel  No results found for: "CHOL", "TRIG", "HDL", "CHOLHDL", "VLDL", "LDLCALC", "LDLDIRECT", "LABVLDL"  No components found for: "NTPROBNP" No results for input(s): "PROBNP" in the last 8760 hours. No results for input(s): "TSH" in the last 8760 hours.  BMP Recent Labs    03/03/22 1330 09/08/22 2211  NA 138 138  K 4.4 3.7  CL 108 107  CO2 23 22  GLUCOSE 91 99  BUN 13 10  CREATININE 0.61 0.73  CALCIUM 9.1 9.3  GFRNONAA >60 >60    HEMOGLOBIN A1C No results found for: "HGBA1C", "MPG"  IMPRESSION:    ICD-10-CM   1. Palpitations  R00.2     2. Precordial pain  R07.2        RECOMMENDATIONS: Virginia Bradley is a 36 y.o. African-American female whose past medical history and cardiac risk factors include: History of seizures, anxiety, depression, GERD.   Patient was referred to the practice for evaluation of palpitations.  She underwent a Zio patch which notes underlying rhythm to be sinus without any dysrhythmias.  Clinically her symptoms have self-limited.  At the last office visit she also complained of precordial pain which appears  to be noncardiac in etiology.  The shared decision was to proceed with an echocardiogram which illustrates preserved LVEF without any significant valvular heart disease.  Symptomatically she has not had any precordial pain since last visit.  No additional cardiovascular testing warranted at this time as her above-mentioned symptoms have resolved.  She is educated on the importance of improving her modifiable cardiovascular risk factors and the importance of complete cessation of marijuana use.  Would like to see the patient on a as needed basis.   FINAL MEDICATION LIST END OF ENCOUNTER: No orders of the defined types were placed in this encounter.   Medications Discontinued During This Encounter  Medication Reason   escitalopram (LEXAPRO) 10 MG tablet      Current Outpatient Medications:    ALPRAZolam (XANAX) 0.5 MG tablet, Take 0.5 mg by mouth at bedtime., Disp: , Rfl:    ibuprofen (ADVIL) 200 MG tablet, Take 200 mg by mouth every 6 (six) hours as needed., Disp: , Rfl:    MELATONIN PO, Take by mouth at bedtime., Disp: , Rfl:   No orders of the defined types were placed in this encounter.   There are no Patient Instructions on file for this visit.   --Continue cardiac medications as reconciled in final medication list. --Return if symptoms worsen or fail to improve. or sooner if needed. --Continue follow-up with your primary care physician regarding the management of your other chronic comorbid conditions.  Patient's questions and concerns were addressed to her satisfaction. She voices understanding of the instructions provided during this encounter.   This note was created using a voice recognition software as a result there may be grammatical errors inadvertently enclosed that do not reflect the nature of this encounter. Every attempt is made to correct such errors.  Total time spent: 20 minutes.  Rex Kras, [place], [facility]  Pager: [phone] Office: [phone]

## 2022-12-22 DIAGNOSIS — Z419 Encounter for procedure for purposes other than remedying health state, unspecified: Secondary | ICD-10-CM | POA: Diagnosis not present

## 2023-01-01 NOTE — Progress Notes (Signed)
Office Visit Note  Patient: Virginia Bradley             Date of Birth: Jun 28, 1986           MRN: [id number]             PCP: Lucianne Lei, MD Referring: Jorge Ny, PA-C Visit Date: 01/14/2023 Occupation: '@GUAROCC'$ @  Subjective:  Pain in multiple joints and muscles  History of Present Illness: Virginia Bradley is a 37 y.o. female seen in consultation per request of Dr. Criss Rosales. According the patient she moved to [place] about 8 years ago.  Last 4 years she has been experiencing increased pain and discomfort which she describes over bilateral trapezius region and her lower back.  She has occasional discomfort in her wrists, hands, knees.  She has not noticed any joint swelling.  Uses heating pad and Biofreeze which helps.  States over-the-counter Tylenol and ibuprofen are not helpful.  He was given a prednisone taper about a year ago by Dr. Criss Rosales.  Patient states that taper helped.  There is no history of oral ulcers, nasal ulcers, malar rash, photosensitivity, Raynaud's phenomenon or lymphadenopathy.  She states she had episodes of palpitations but her EKG was normal and then the palpitations resolved.  Her father has MGUS and brother has Crohn's disease.  She is gravida 1, para 0, abortion 1.  Patient is not using any contraception.  She states that she has a female partner.    Activities of Daily Living:  Patient reports morning stiffness for 30 minutes.   Patient Reports nocturnal pain.  Difficulty dressing/grooming: Reports Difficulty climbing stairs: Denies Difficulty getting out of chair: Denies Difficulty using hands for taps, buttons, cutlery, and/or writing: Denies  Review of Systems  Constitutional:  Positive for fatigue.  HENT:  Negative for mouth sores and mouth dryness.   Eyes:  Negative for dryness.  Respiratory:  Negative for shortness of breath.   Cardiovascular:  Negative for chest pain.  Gastrointestinal:  Negative for blood in stool, constipation and  diarrhea.  Endocrine: Negative for increased urination.  Genitourinary:  Negative for involuntary urination.  Musculoskeletal:  Positive for joint pain, joint pain, myalgias, morning stiffness, muscle tenderness and myalgias. Negative for gait problem, joint swelling and muscle weakness.  Skin:  Negative for color change, rash, hair loss and sensitivity to sunlight.  Allergic/Immunologic: Negative for susceptible to infections.  Neurological:  Negative for dizziness and headaches.  Hematological:  Negative for swollen glands.  Psychiatric/Behavioral:  Positive for sleep disturbance. Negative for depressed mood. The patient is nervous/anxious.     PMFS History:  Patient Active Problem List   Diagnosis Date Noted   Recurrent major depressive disorder, in partial remission ([place]) 05/19/2019   Localization-related idiopathic epilepsy and epileptic syndromes with seizures of localized onset, not intractable, without status epilepticus ([place]) 07/20/2018   Seizure disorder ([place]) 03/24/2016   Migraine 03/24/2016    Past Medical History:  Diagnosis Date   Anxiety    Complication of anesthesia    Depression    Family history of adverse reaction to anesthesia    GERD (gastroesophageal reflux disease)    History of COVID-19    per pt mild symptoms that resolved 03/ 2022  and 09/ 2022   Ovarian cyst, left    Seizure disorder ([place])    (03-03-2022   pt stated last seizure seizure approxl 2020/ 2021, none since , no medications) seen 3 different neurologist documented in epic, first seen by dr [place] Siren 03-24-2016;  dr Shela Nevin 06-01-2019;   dr Johnette Abraham. feraru ([facility] in care everywhere [facility] 10-05-2019;;  febrile seizures as infant then started 2001 as teen;   hx negative EEG's in epic    Family History  Problem Relation Age of Onset   Seizures Mother    Other Father        smoldering myeloma   Hypertension Father    Crohn's disease Brother    Hypertension Maternal Grandmother    Past  Surgical History:  Procedure Laterality Date   APPENDECTOMY  2002   LAPAROSCOPIC OOPHORECTOMY Right 2007   per pt  thinks it was right ovary removed   ROBOTIC ASSISTED LAPAROSCOPIC OVARIAN CYSTECTOMY Left 03/06/2022   Procedure: XI ROBOTIC ASSISTED [place];  Surgeon: Armandina Stammer, DO;  Location: [facility];  Service: Gynecology;  Laterality: Left;   Social History   Social History Narrative   Pt lives alone in 1 story home, on the second floor   Has associates degree   Works in daycare facility    There is no immunization history on file for this patient.   Objective: Vital Signs: BP 115/76 (BP Location: Right Arm, Patient Position: Sitting, Cuff Size: Normal)   Pulse 97   Resp 16   Ht '5\' 7"'$  (1.702 m)   Wt 194 lb 9.6 oz (88.3 kg)   BMI 30.48 kg/m    Physical Exam Vitals and nursing note reviewed.  Constitutional:      Appearance: She is well-developed.  HENT:     Head: Normocephalic and atraumatic.  Eyes:     Conjunctiva/sclera: Conjunctivae normal.  Cardiovascular:     Rate and Rhythm: Normal rate and regular rhythm.     Heart sounds: Normal heart sounds.  Pulmonary:     Effort: Pulmonary effort is normal.     Breath sounds: Normal breath sounds.  Abdominal:     General: Bowel sounds are normal.     Palpations: Abdomen is soft.  Musculoskeletal:     Cervical back: Normal range of motion.  Lymphadenopathy:     Cervical: No cervical adenopathy.  Skin:    General: Skin is warm and dry.     Capillary Refill: Capillary refill takes less than 2 seconds.  Neurological:     Mental Status: She is alert and oriented to person, place, and time.  Psychiatric:        Behavior: Behavior normal.      Musculoskeletal Exam: Cervical, thoracic and lumbar spine were in good range of motion.  She had discomfort and tenderness over bilateral trapezius region.  She had tenderness over lower lumbar region.  No SI joint tenderness was  noted.  Shoulder joints, elbow joints, wrist joints, MCPs PIPs and DIPs been good range of motion.  She had mild tenderness over the dorsal aspect of her right distal forearm.  No MCP PIP or DIP synovitis was noted.  Hip joints and knee joints were in good range of motion.  No warmth swelling or effusion was noted.  There was no tenderness over ankles or MTPs.  CDAI Exam: CDAI Score: -- Patient Global: --; Provider Global: -- Swollen: --; Tender: -- Joint Exam 01/14/2023   No joint exam has been documented for this visit   There is currently no information documented on the homunculus. Go to the Rheumatology activity and complete the homunculus joint exam.  Investigation: No additional findings.  Imaging: No results found.  Recent Labs: Lab Results  Component Value Date  WBC 10.5 09/08/2022   HGB 14.0 09/08/2022   PLT 445 (H) 09/08/2022   NA 138 09/08/2022   K 3.7 09/08/2022   CL 107 09/08/2022   CO2 22 09/08/2022   GLUCOSE 99 09/08/2022   BUN 10 09/08/2022   CREATININE 0.73 09/08/2022   BILITOT 0.1 (L) 09/08/2022   ALKPHOS 79 09/08/2022   AST 12 (L) 09/08/2022   ALT 14 09/08/2022   PROT 6.6 09/08/2022   ALBUMIN 3.9 09/08/2022   CALCIUM 9.3 09/08/2022   GFRAA >60 01/28/2019    Speciality Comments: No specialty comments available.  Procedures:  No procedures performed Allergies: Depakote er [divalproex sodium er], Valproic acid, and Dilantin [phenytoin sodium extended]   Assessment / Plan:     Visit Diagnoses: Trapezius muscle spasm -she gives history of neck a stiffness.  She has bilateral trapezius spasm which causes discomfort.  She states the symptoms have been going on for the last 4 years.  She uses heating pad and IcyHot which helps to some extent.  She has tried over-the-counter products without much help.  She had tenderness over bilateral trapezius region.  Plan: XR Cervical Spine 2 or 3 views.  Multilevel spondylosis with facet joint arthropathy was noted.   Significant narrowing between C5-C6 and C6-C7 was noted.  Chronic midline low back pain without sciatica -she gives history of lower back pain for several years.  The pain is localized to her lower back.  She denies any radiculopathy.- Plan: XR Lumbar Spine 2-3 Views.  Facet joint arthropathy of lumbar spine was noted.  Pain in right hand -she complains of discomfort in her right forearm over the distal region.  She had tenderness over the dorsum aspect of her right forearm.  No synovitis was noted.  She also gives history of intermittent discomfort in her hands.  No synovitis was noted.  Plan: XR Hand 2 View Right.  X-rays of the right hand were unremarkable.  Generalized pain -she gives history of generalized pain and discomfort all over her body.  To complete the workup I will obtain following labs today.  Plan: Sedimentation rate, CK, Rheumatoid factor, Cyclic citrul peptide antibody, IgG, ANA  Fibromyalgia-patient appears to have fibromyalgia with generalized pain and discomfort.  She had positive tender points and hyperalgesia.  Detailed counsel regarding fibromyalgia was provided.  Need for regular exercise including water aerobics, swimming and stretching were discussed.  She may benefit from physical therapy.  I will make a referral.  Primary insomnia-she gives history of chronic insomnia for several years.  Other fatigue -she gives history of fatigue.  I will obtain following labs today.  Plan: TSH, VITAMIN D 25 Hydroxy (Vit-D Deficiency, Fractures)  Thrombocytosis-platelets have been elevated over the years.  Hx of migraines-she gives history of migraine headaches.  Other medical problems are listed as follows:  Localization-related idiopathic epilepsy and epileptic syndromes with seizures of localized onset, not intractable, without status epilepticus ([place])  Recurrent major depressive disorder, in partial remission ([place])  History of anxiety  Benign teratoma of ovary, unspecified  laterality  Family history of Crohn's disease-brother  Orders: Orders Placed This Encounter  Procedures   XR Cervical Spine 2 or 3 views   XR Lumbar Spine 2-3 Views   XR Hand 2 View Right   Sedimentation rate   CK   TSH   VITAMIN D 25 Hydroxy (Vit-D Deficiency, Fractures)   Rheumatoid factor   Cyclic citrul peptide antibody, IgG   ANA   Ambulatory referral to Physical Therapy  No orders of the defined types were placed in this encounter.    Follow-Up Instructions: Return for Myalgia, fatigue.   Bo Merino, MD  Note - This record has been created using Editor, commissioning.  Chart creation errors have been sought, but may not always  have been located. Such creation errors do not reflect on  the standard of medical care.

## 2023-01-14 ENCOUNTER — Ambulatory Visit (INDEPENDENT_AMBULATORY_CARE_PROVIDER_SITE_OTHER): Payer: Medicaid Other

## 2023-01-14 ENCOUNTER — Encounter: Payer: Self-pay | Admitting: Rheumatology

## 2023-01-14 ENCOUNTER — Ambulatory Visit: Payer: Medicaid Other | Attending: Rheumatology | Admitting: Rheumatology

## 2023-01-14 VITALS — BP 115/76 | HR 97 | Resp 16 | Ht 67.0 in | Wt 194.6 lb

## 2023-01-14 DIAGNOSIS — M545 Low back pain, unspecified: Secondary | ICD-10-CM

## 2023-01-14 DIAGNOSIS — M797 Fibromyalgia: Secondary | ICD-10-CM

## 2023-01-14 DIAGNOSIS — M62838 Other muscle spasm: Secondary | ICD-10-CM | POA: Diagnosis not present

## 2023-01-14 DIAGNOSIS — R52 Pain, unspecified: Secondary | ICD-10-CM | POA: Diagnosis not present

## 2023-01-14 DIAGNOSIS — Z8379 Family history of other diseases of the digestive system: Secondary | ICD-10-CM

## 2023-01-14 DIAGNOSIS — D75839 Thrombocytosis, unspecified: Secondary | ICD-10-CM

## 2023-01-14 DIAGNOSIS — G8929 Other chronic pain: Secondary | ICD-10-CM

## 2023-01-14 DIAGNOSIS — G40009 Localization-related (focal) (partial) idiopathic epilepsy and epileptic syndromes with seizures of localized onset, not intractable, without status epilepticus: Secondary | ICD-10-CM

## 2023-01-14 DIAGNOSIS — Z8659 Personal history of other mental and behavioral disorders: Secondary | ICD-10-CM

## 2023-01-14 DIAGNOSIS — R5383 Other fatigue: Secondary | ICD-10-CM

## 2023-01-14 DIAGNOSIS — F3341 Major depressive disorder, recurrent, in partial remission: Secondary | ICD-10-CM

## 2023-01-14 DIAGNOSIS — M79641 Pain in right hand: Secondary | ICD-10-CM

## 2023-01-14 DIAGNOSIS — D279 Benign neoplasm of unspecified ovary: Secondary | ICD-10-CM

## 2023-01-14 DIAGNOSIS — F5101 Primary insomnia: Secondary | ICD-10-CM

## 2023-01-14 DIAGNOSIS — Z8669 Personal history of other diseases of the nervous system and sense organs: Secondary | ICD-10-CM

## 2023-01-15 ENCOUNTER — Other Ambulatory Visit: Payer: Self-pay

## 2023-01-15 DIAGNOSIS — E559 Vitamin D deficiency, unspecified: Secondary | ICD-10-CM

## 2023-01-15 MED ORDER — VITAMIN D (ERGOCALCIFEROL) 1.25 MG (50000 UNIT) PO CAPS: 50000.0000 [IU] | ORAL_CAPSULE | ORAL | 0 refills | Status: DC

## 2023-01-15 NOTE — Telephone Encounter (Signed)
Please review and sign pended vitamin D rx and future lab order. Thanks!   Labs: 01/14/2023 Given D is low.  Please send a prescription for vitamin D 50,000 units twice a week for 3 months.  Repeat vitamin D level in 3 months.

## 2023-01-15 NOTE — Progress Notes (Signed)
Given D is low.  Please send a prescription for vitamin D 50,000 units twice a week for 3 months.  Repeat vitamin D level in 3 months.

## 2023-01-16 LAB — ANTI-NUCLEAR AB-TITER (ANA TITER): ANA Titer 1: 1:40 {titer} — ABNORMAL HIGH

## 2023-01-16 LAB — CK: Total CK: 122 U/L (ref 29–143)

## 2023-01-16 LAB — RHEUMATOID FACTOR: Rheumatoid fact SerPl-aCnc: <14 IU/mL (ref <14)

## 2023-01-16 LAB — CYCLIC CITRUL PEPTIDE ANTIBODY, IGG: Cyclic Citrullin Peptide Ab: <16 UNITS

## 2023-01-16 LAB — SEDIMENTATION RATE: Sed Rate: 11 mm/h (ref 0–20)

## 2023-01-16 LAB — VITAMIN D 25 HYDROXY (VIT D DEFICIENCY, FRACTURES): Vit D, 25-Hydroxy: 12 ng/mL — ABNORMAL LOW (ref 30–100)

## 2023-01-16 LAB — TSH: TSH: 0.68 mIU/L

## 2023-01-16 LAB — ANA: Anti Nuclear Antibody (ANA): POSITIVE — AB

## 2023-01-16 NOTE — Progress Notes (Signed)
I will discuss results at the follow-up visit.

## 2023-01-21 NOTE — Progress Notes (Signed)
Office Visit Note  Patient: Virginia Bradley             Date of Birth: 04-14-1986           MRN: [id number]             PCP: Lucianne Lei, MD Referring: Lucianne Lei, MD Visit Date: 02/04/2023 Occupation: '@GUAROCC'$ @  Subjective:   Pain in multiple joints and muscles  History of Present Illness: Virginia Bradley is a 37 y.o. female returns today after her last visit on January 14, 2023.  She states that she continues to have pain and discomfort in her neck trapezius region and her lower back.  She continues to have some generalized pain and discomfort in her hands, wrist, knees.  She has not noticed any joint swelling.  She takes Tylenol and ibuprofen which is not helping.  There is no history of oral ulcers, nasal ulcers, malar rash, photosensitivity, Raynaud's, lymphadenopathy or inflammatory arthritis.    Activities of Daily Living:  Patient reports morning stiffness for 20 minutes.   Patient Reports nocturnal pain.  Difficulty dressing/grooming: Reports Difficulty climbing stairs: Denies Difficulty getting out of chair: Denies Difficulty using hands for taps, buttons, cutlery, and/or writing: Denies  Review of Systems  Constitutional:  Positive for fatigue.  HENT: Negative.  Negative for mouth sores and mouth dryness.   Eyes: Negative.  Negative for dryness.  Respiratory: Negative.  Negative for shortness of breath.   Cardiovascular: Negative.  Negative for chest pain and palpitations.  Gastrointestinal: Negative.  Negative for blood in stool, constipation and diarrhea.  Endocrine: Negative.  Negative for increased urination.  Genitourinary: Negative.  Negative for involuntary urination.  Musculoskeletal:  Positive for joint pain, joint pain, joint swelling, morning stiffness and muscle tenderness. Negative for gait problem, myalgias, muscle weakness and myalgias.  Skin: Negative.  Negative for color change, rash, hair loss and sensitivity to sunlight.  Allergic/Immunologic:  Positive for susceptible to infections.  Neurological:  Negative for dizziness and headaches.  Hematological: Negative.  Negative for swollen glands.  Psychiatric/Behavioral:  Positive for sleep disturbance. Negative for depressed mood. The patient is nervous/anxious.     PMFS History:  Patient Active Problem List   Diagnosis Date Noted   Recurrent major depressive disorder, in partial remission ([place]) 05/19/2019   Localization-related idiopathic epilepsy and epileptic syndromes with seizures of localized onset, not intractable, without status epilepticus ([place]) 07/20/2018   Seizure disorder ([place]) 03/24/2016   Migraine 03/24/2016    Past Medical History:  Diagnosis Date   Anxiety    Complication of anesthesia    Depression    Family history of adverse reaction to anesthesia    GERD (gastroesophageal reflux disease)    History of COVID-19    per pt mild symptoms that resolved 03/ 2022  and 09/ 2022   Ovarian cyst, left    Seizure disorder ([place])    (03-03-2022   pt stated last seizure seizure approxl 2020/ 2021, none since , no medications) seen 3 different neurologist documented in epic, first seen by dr [place] Siren 03-24-2016;  dr Shela Nevin 06-01-2019;   dr e. feraru ([facility] in care [facility] 10-05-2019;;  febrile seizures as infant then started 2001 as teen;   hx negative EEG's in epic    Family History  Problem Relation Age of Onset   Seizures Mother    Other Father        smoldering myeloma   Hypertension Father    Crohn's disease Brother  Hypertension Maternal Grandmother    Past Surgical History:  Procedure Laterality Date   APPENDECTOMY  2002   LAPAROSCOPIC OOPHORECTOMY Right 2007   per pt  thinks it was right ovary removed   ROBOTIC ASSISTED LAPAROSCOPIC OVARIAN CYSTECTOMY Left 03/06/2022   Procedure: XI ROBOTIC ASSISTED LAPAROSCOPIC OVARIAN CYSTECTOMY;  Surgeon: Armandina Stammer, DO;  Location: [facility];  Service: Gynecology;  Laterality:  Left;   Social History   Social History Narrative   Pt lives alone in 1 story home, on the second floor   Has associates degree   Works in daycare facility    There is no immunization history on file for this patient.   Objective: Vital Signs: BP 109/75 (BP Location: Left Arm, Patient Position: Sitting, Cuff Size: Large)   Pulse 98   Resp 16   Ht '5\' 7"'$  (1.702 m)   Wt 194 lb (88 kg)   BMI 30.38 kg/m    Physical Exam Vitals and nursing note reviewed.  Constitutional:      Appearance: She is well-developed.  HENT:     Head: Normocephalic and atraumatic.  Eyes:     Conjunctiva/sclera: Conjunctivae normal.  Cardiovascular:     Rate and Rhythm: Normal rate and regular rhythm.     Heart sounds: Normal heart sounds.  Pulmonary:     Effort: Pulmonary effort is normal.     Breath sounds: Normal breath sounds.  Abdominal:     General: Bowel sounds are normal.     Palpations: Abdomen is soft.  Musculoskeletal:     Cervical back: Normal range of motion.  Lymphadenopathy:     Cervical: No cervical adenopathy.  Skin:    General: Skin is warm and dry.     Capillary Refill: Capillary refill takes less than 2 seconds.  Neurological:     Mental Status: She is alert and oriented to person, place, and time.  Psychiatric:        Behavior: Behavior normal.      Musculoskeletal Exam: Cervical, thoracic and lumbar spine were in good range of motion.  She had bilateral trapezius spasm.  Shoulders, elbows, wrist joints, MCPs PIPs and DIPs been good range of motion with no synovitis.  Hip joints and knee joints in good range of motion without any warmth swelling or effusion.  There was no tenderness over ankles or MTPs.  CDAI Exam: CDAI Score: -- Patient Global: --; Provider Global: -- Swollen: --; Tender: -- Joint Exam 02/04/2023   No joint exam has been documented for this visit   There is currently no information documented on the homunculus. Go to the Rheumatology activity and  complete the homunculus joint exam.  Investigation: No additional findings.  Imaging: XR Lumbar Spine 2-3 Views  Result Date: 01/14/2023 No significant disc space narrowing was noted.  Facet joint narrowing was noted.  No SI joint sclerosis was noted. Impression: These findings are consistent with facet joint arthropathy of the lumbar spine.  XR Cervical Spine 2 or 3 views  Result Date: 01/14/2023 C5-C6 and C6-C7 narrowing with anterior osteophytes was noted.  Mild facet joint narrowing was noted. Impression: These findings are consistent with degenerative disc disease of the cervical spine.  XR Hand 2 View Right  Result Date: 01/14/2023 No CMC, MCP, PIP and DIP narrowing was noted.  No intercarpal or radiocarpal joint space narrowing was noted.  No erosive changes were noted. Impression: Unremarkable x-rays of the hand.   Recent Labs: Lab Results  Component Value  Date   WBC 10.5 09/08/2022   HGB 14.0 09/08/2022   PLT 445 (H) 09/08/2022   NA 138 09/08/2022   K 3.7 09/08/2022   CL 107 09/08/2022   CO2 22 09/08/2022   GLUCOSE 99 09/08/2022   BUN 10 09/08/2022   CREATININE 0.73 09/08/2022   BILITOT 0.1 (L) 09/08/2022   ALKPHOS 79 09/08/2022   AST 12 (L) 09/08/2022   ALT 14 09/08/2022   PROT 6.6 09/08/2022   ALBUMIN 3.9 09/08/2022   CALCIUM 9.3 09/08/2022   GFRAA >60 01/28/2019   January 14, 2023 ANA 1: 40NS, RF negative, anti-CCP negative, ESR 11, CK122, TSH normal, vitamin D 12  Speciality Comments: No specialty comments available.  Procedures:  No procedures performed Allergies: Depakote er [divalproex sodium er], Valproic acid, and Dilantin [phenytoin sodium extended]   Assessment / Plan:     Visit Diagnoses: DDD (degenerative disc disease), cervical -she has been experiencing pain and discomfort in her cervical region.  X-rays showed mild spondylosis and facet joint arthropathy.  Most prominent narrowing was noted between C5-6 and C6-C7.  X-ray findings were reviewed  with the patient today.  She will  benefit from physical therapy.  She has an appointment coming up with physical therapy.  Trapezius muscle spasm -she had bilateral trapezius spasm.  Which appears to be related to degenerative disc disease.  A handout on neck stretching exercises was given.  Arthropathy of lumbar facet joint -she complains of lower back pain.  History of lower back pain for several years.  X-rays showed facet joint arthropathy.  X-ray findings were reviewed with the patient today.  She would benefit from physical therapy and core strengthening.  A handout on exercises was given.  Pain in right hand - History of intermittent discomfort in the hand and forearm.  No synovitis was noted.  X-rays were unremarkable.  X-rays were reviewed with the patient.  Vitamin D deficiency-vitamin D was low at 12.  She was given vitamin D 50,000 units twice a week for 3 months.  Will recheck vitamin D level in 3 months.  She was advised to maintain on vitamin D 2000 units daily minimum.  Fibromyalgia - History of generalized pain, hyperalgesia.  All autoimmune workup negative.  She was referred to physical therapy.  Detailed counsel regarding fibromyalgia was provided.  Need for regular exercise and stretching was discussed.  She would benefit from water aerobics, swimming, yoga.  She may also benefit from the use of Cymbalta.  She was advised to discuss it further with Dr. Criss Rosales.  Other fatigue -possibly related to fibromyalgia, insomnia and vitamin D deficiency.  She is on vitamin D currently.  Primary insomnia-good sleep hygiene was discussed.  Anxiety and depression-she is currently not taking any medications.  She may benefit from Cymbalta.  Other medical problems listed as follows:  Localization-related idiopathic epilepsy and epileptic syndromes with seizures of localized onset, not intractable, without status epilepticus ([place])  Hx of migraines  Thrombocytosis  Benign teratoma of  ovary, unspecified laterality  Family history of Crohn's disease-brother  Orders: No orders of the defined types were placed in this encounter.  No orders of the defined types were placed in this encounter.    Follow-Up Instructions: Return in about 6 months (around 08/05/2023) for Osteoarthritis.   Bo Merino, MD  Note - This record has been created using Editor, commissioning.  Chart creation errors have been sought, but may not always  have been located. Such creation errors do not reflect  on  the standard of medical care.

## 2023-01-22 DIAGNOSIS — Z419 Encounter for procedure for purposes other than remedying health state, unspecified: Secondary | ICD-10-CM | POA: Diagnosis not present

## 2023-02-02 DIAGNOSIS — F3341 Major depressive disorder, recurrent, in partial remission: Secondary | ICD-10-CM | POA: Diagnosis not present

## 2023-02-04 ENCOUNTER — Ambulatory Visit: Payer: Commercial Managed Care - HMO | Attending: Rheumatology | Admitting: Rheumatology

## 2023-02-04 ENCOUNTER — Encounter: Payer: Self-pay | Admitting: Rheumatology

## 2023-02-04 VITALS — BP 109/75 | HR 98 | Resp 16 | Ht 67.0 in | Wt 194.0 lb

## 2023-02-04 DIAGNOSIS — D75839 Thrombocytosis, unspecified: Secondary | ICD-10-CM

## 2023-02-04 DIAGNOSIS — F419 Anxiety disorder, unspecified: Secondary | ICD-10-CM

## 2023-02-04 DIAGNOSIS — R5383 Other fatigue: Secondary | ICD-10-CM

## 2023-02-04 DIAGNOSIS — G40009 Localization-related (focal) (partial) idiopathic epilepsy and epileptic syndromes with seizures of localized onset, not intractable, without status epilepticus: Secondary | ICD-10-CM

## 2023-02-04 DIAGNOSIS — F32A Depression, unspecified: Secondary | ICD-10-CM

## 2023-02-04 DIAGNOSIS — M62838 Other muscle spasm: Secondary | ICD-10-CM

## 2023-02-04 DIAGNOSIS — M5030 Other cervical disc degeneration, unspecified cervical region: Secondary | ICD-10-CM

## 2023-02-04 DIAGNOSIS — E559 Vitamin D deficiency, unspecified: Secondary | ICD-10-CM

## 2023-02-04 DIAGNOSIS — M47816 Spondylosis without myelopathy or radiculopathy, lumbar region: Secondary | ICD-10-CM | POA: Diagnosis not present

## 2023-02-04 DIAGNOSIS — Z8379 Family history of other diseases of the digestive system: Secondary | ICD-10-CM

## 2023-02-04 DIAGNOSIS — M797 Fibromyalgia: Secondary | ICD-10-CM | POA: Diagnosis not present

## 2023-02-04 DIAGNOSIS — D279 Benign neoplasm of unspecified ovary: Secondary | ICD-10-CM

## 2023-02-04 DIAGNOSIS — M79641 Pain in right hand: Secondary | ICD-10-CM

## 2023-02-04 DIAGNOSIS — F5101 Primary insomnia: Secondary | ICD-10-CM | POA: Diagnosis not present

## 2023-02-04 DIAGNOSIS — Z8669 Personal history of other diseases of the nervous system and sense organs: Secondary | ICD-10-CM

## 2023-02-04 NOTE — Patient Instructions (Signed)
Back Exercises The following exercises strengthen the muscles that help to support the trunk (torso) and back. They also help to keep the lower back flexible. Doing these exercises can help to prevent or lessen existing low back pain. If you have back pain or discomfort, try doing these exercises 2-3 times each day or as told by your health care provider. As your pain improves, do them once each day, but increase the number of times that you repeat the steps for each exercise (do more repetitions). To prevent the recurrence of back pain, continue to do these exercises once each day or as told by your health care provider. Do exercises exactly as told by your health care provider and adjust them as directed. It is normal to feel mild stretching, pulling, tightness, or discomfort as you do these exercises, but you should stop right away if you feel sudden pain or your pain gets worse. Exercises Single knee to chest Repeat these steps 3-5 times for each leg: Lie on your back on a firm bed or the floor with your legs extended. Bring one knee to your chest. Your other leg should stay extended and in contact with the floor. Hold your knee in place by grabbing your knee or thigh with both hands and hold. Pull on your knee until you feel a gentle stretch in your lower back or buttocks. Hold the stretch for 10-30 seconds. Slowly release and straighten your leg.  Pelvic tilt Repeat these steps 5-10 times: Lie on your back on a firm bed or the floor with your legs extended. Bend your knees so they are pointing toward the ceiling and your feet are flat on the floor. Tighten your lower abdominal muscles to press your lower back against the floor. This motion will tilt your pelvis so your tailbone points up toward the ceiling instead of pointing to your feet or the floor. With gentle tension and even breathing, hold this position for 5-10 seconds.  Cat-cow Repeat these steps until your lower back becomes  more flexible: Get into a hands-and-knees position on a firm bed or the floor. Keep your hands under your shoulders, and keep your knees under your hips. You may place padding under your knees for comfort. Let your head hang down toward your chest. Contract your abdominal muscles and point your tailbone toward the floor so your lower back becomes rounded like the back of a cat. Hold this position for 5 seconds. Slowly lift your head, let your abdominal muscles relax, and point your tailbone up toward the ceiling so your back forms a sagging arch like the back of a cow. Hold this position for 5 seconds.  Press-ups Repeat these steps 5-10 times: Lie on your abdomen (face-down) on a firm bed or the floor. Place your palms near your head, about shoulder-width apart. Keeping your back as relaxed as possible and keeping your hips on the floor, slowly straighten your arms to raise the top half of your body and lift your shoulders. Do not use your back muscles to raise your upper torso. You may adjust the placement of your hands to make yourself more comfortable. Hold this position for 5 seconds while you keep your back relaxed. Slowly return to lying flat on the floor.  Bridges Repeat these steps 10 times: Lie on your back on a firm bed or the floor. Bend your knees so they are pointing toward the ceiling and your feet are flat on the floor. Your arms should be flat   at your sides, next to your body. Tighten your buttocks muscles and lift your buttocks off the floor until your waist is at almost the same height as your knees. You should feel the muscles working in your buttocks and the back of your thighs. If you do not feel these muscles, slide your feet 1-2 inches (2.5-5 cm) farther away from your buttocks. Hold this position for 3-5 seconds. Slowly lower your hips to the starting position, and allow your buttocks muscles to relax completely. If this exercise is too easy, try doing it with your arms  crossed over your chest. Abdominal crunches Repeat these steps 5-10 times: Lie on your back on a firm bed or the floor with your legs extended. Bend your knees so they are pointing toward the ceiling and your feet are flat on the floor. Cross your arms over your chest. Tip your chin slightly toward your chest without bending your neck. Tighten your abdominal muscles and slowly raise your torso high enough to lift your shoulder blades a tiny bit off the floor. Avoid raising your torso higher than that because it can put too much stress on your lower back and does not help to strengthen your abdominal muscles. Slowly return to your starting position.  Back lifts Repeat these steps 5-10 times: Lie on your abdomen (face-down) with your arms at your sides, and rest your forehead on the floor. Tighten the muscles in your legs and your buttocks. Slowly lift your chest off the floor while you keep your hips pressed to the floor. Keep the back of your head in line with the curve in your back. Your eyes should be looking at the floor. Hold this position for 3-5 seconds. Slowly return to your starting position.  Contact a health care provider if: Your back pain or discomfort gets much worse when you do an exercise. Your worsening back pain or discomfort does not lessen within 2 hours after you exercise. If you have any of these problems, stop doing these exercises right away. Do not do them again unless your health care provider says that you can. Get help right away if: You develop sudden, severe back pain. If this happens, stop doing the exercises right away. Do not do them again unless your health care provider says that you can. This information is not intended to replace advice given to you by your health care provider. Make sure you discuss any questions you have with your health care provider. Document Revised: 06/04/2021 Document Reviewed: 02/20/2021 Elsevier Patient Education  [place].   Cervical Strain and Sprain Rehab Ask your health care provider which exercises are safe for you. Do exercises exactly as told by your health care provider and adjust them as directed. It is normal to feel mild stretching, pulling, tightness, or discomfort as you do these exercises. Stop right away if you feel sudden pain or your pain gets worse. Do not begin these exercises until told by your health care provider. Stretching and range-of-motion exercises Cervical side bending  Using good posture, sit on a stable chair or stand up. Without moving your shoulders, slowly tilt your left / right ear to your shoulder until you feel a stretch in the neck muscles on the opposite side. You should be looking straight ahead. Hold for __________ seconds. Repeat with the other side of your neck. Repeat __________ times. Complete this exercise __________ times a day. Cervical rotation  Using good posture, sit on a stable chair or stand  up. Slowly turn your head to the side as if you are looking over your left / right shoulder. Keep your eyes level with the ground. Stop when you feel a stretch along the side and the back of your neck. Hold for __________ seconds. Repeat this by turning to your other side. Repeat __________ times. Complete this exercise __________ times a day. Thoracic extension and pectoral stretch  Roll a towel or a small blanket so it is about 4 inches (10 cm) in diameter. Lie down on your back on a firm surface. Put the towel in the middle of your back across your spine. It should not be under your shoulder blades. Put your hands behind your head and let your elbows fall out to your sides. Hold for __________ seconds. Repeat __________ times. Complete this exercise __________ times a day. Strengthening exercises Upper cervical flexion  Lie on your back with a thin pillow behind your head or a small, rolled-up towel under your neck. Gently tuck your chin toward your chest  and nod your head down to look toward your feet. Do not lift your head off the pillow. Hold for __________ seconds. Release the tension slowly. Relax your neck muscles completely before you repeat this exercise. Repeat __________ times. Complete this exercise __________ times a day. Cervical extension  Stand about 6 inches (15 cm) away from a wall, with your back facing the wall. Place a soft object, about 6-8 inches (15-20 cm) in diameter, between the back of your head and the wall. A soft object could be a small pillow, a ball, or a folded towel. Gently tilt your head back and press into the soft object. Keep your jaw and forehead relaxed. Hold for __________ seconds. Release the tension slowly. Relax your neck muscles completely before you repeat this exercise. Repeat __________ times. Complete this exercise __________ times a day. Posture and body mechanics Body mechanics refer to the movements and positions of your body while you do your daily activities. Posture is part of body mechanics. Good posture and healthy body mechanics can help to relieve stress in your body's tissues and joints. Good posture means that your spine is in its natural S-curve position (your spine is neutral), your shoulders are pulled back slightly, and your head is not tipped forward. The following are general guidelines for using improved posture and body mechanics in your everyday activities. Sitting  When sitting, keep your spine neutral and keep your feet flat on the floor. Use a footrest, if needed, and keep your thighs parallel to the floor. Avoid rounding your shoulders. Avoid tilting your head forward. When working at a desk or a computer, keep your desk at a height where your hands are slightly lower than your elbows. Slide your chair under your desk so you are close enough to maintain good posture. When working at a computer, place your monitor at a height where you are looking straight ahead and you do not  have to tilt your head forward or downward to look at the screen. Standing  When standing, keep your spine neutral and keep your feet about hip-width apart. Keep a slight bend in your knees. Your ears, shoulders, and hips should line up. When you do a task in which you stand in one place for a long time, place one foot up on a stable object that is 2-4 inches (5-10 cm) high, such as a footstool. This helps keep your spine neutral. Resting When lying down and resting, avoid positions  that are most painful for you. Try to support your neck in a neutral position. You can use a contour pillow or a small rolled-up towel. Your pillow should support your neck but not push on it. This information is not intended to replace advice given to you by your health care provider. Make sure you discuss any questions you have with your health care provider. Document Revised: 06/30/2022 Document Reviewed: 06/30/2022 Elsevier Patient Education  [employer].

## 2023-02-16 DIAGNOSIS — F3341 Major depressive disorder, recurrent, in partial remission: Secondary | ICD-10-CM | POA: Diagnosis not present

## 2023-02-18 ENCOUNTER — Telehealth: Payer: Self-pay

## 2023-02-18 NOTE — Telephone Encounter (Signed)
Mychart msg sent

## 2023-02-20 DIAGNOSIS — Z419 Encounter for procedure for purposes other than remedying health state, unspecified: Secondary | ICD-10-CM | POA: Diagnosis not present

## 2023-03-02 DIAGNOSIS — F4310 Post-traumatic stress disorder, unspecified: Secondary | ICD-10-CM | POA: Diagnosis not present

## 2023-03-03 DIAGNOSIS — G4710 Hypersomnia, unspecified: Secondary | ICD-10-CM | POA: Diagnosis not present

## 2023-03-03 DIAGNOSIS — F3341 Major depressive disorder, recurrent, in partial remission: Secondary | ICD-10-CM | POA: Diagnosis not present

## 2023-03-03 DIAGNOSIS — F411 Generalized anxiety disorder: Secondary | ICD-10-CM | POA: Diagnosis not present

## 2023-03-04 ENCOUNTER — Telehealth: Payer: Self-pay | Admitting: Rheumatology

## 2023-03-04 NOTE — Telephone Encounter (Signed)
Patient called stating her insurance has changed and now has [facility] Medicaid.  Patient states [facility] doesn't take medicaid and requested a referral to another physical therapy clinic.

## 2023-03-23 DIAGNOSIS — Z419 Encounter for procedure for purposes other than remedying health state, unspecified: Secondary | ICD-10-CM | POA: Diagnosis not present

## 2023-04-03 DIAGNOSIS — F411 Generalized anxiety disorder: Secondary | ICD-10-CM | POA: Diagnosis not present

## 2023-04-03 DIAGNOSIS — G4710 Hypersomnia, unspecified: Secondary | ICD-10-CM | POA: Diagnosis not present

## 2023-04-03 DIAGNOSIS — F3341 Major depressive disorder, recurrent, in partial remission: Secondary | ICD-10-CM | POA: Diagnosis not present

## 2023-04-08 ENCOUNTER — Ambulatory Visit: Payer: Medicaid Other | Attending: Rheumatology

## 2023-04-08 ENCOUNTER — Other Ambulatory Visit: Payer: Self-pay

## 2023-04-08 DIAGNOSIS — M797 Fibromyalgia: Secondary | ICD-10-CM | POA: Diagnosis not present

## 2023-04-08 DIAGNOSIS — M5459 Other low back pain: Secondary | ICD-10-CM | POA: Insufficient documentation

## 2023-04-08 DIAGNOSIS — M6281 Muscle weakness (generalized): Secondary | ICD-10-CM | POA: Diagnosis not present

## 2023-04-08 DIAGNOSIS — M542 Cervicalgia: Secondary | ICD-10-CM | POA: Diagnosis not present

## 2023-04-08 DIAGNOSIS — M62838 Other muscle spasm: Secondary | ICD-10-CM | POA: Diagnosis not present

## 2023-04-08 DIAGNOSIS — G8929 Other chronic pain: Secondary | ICD-10-CM | POA: Insufficient documentation

## 2023-04-08 DIAGNOSIS — M545 Low back pain, unspecified: Secondary | ICD-10-CM | POA: Insufficient documentation

## 2023-04-08 NOTE — Patient Instructions (Signed)
Aquatic Therapy at [facility]-  What to Expect!  Where:   [facility] [address] [facility] [place], [place] [address] Rehab phone [phone]  NOTE:  You will receive an automated phone message reminding you of your appt and it will say the appointment is at the [address] [facility] [address] [facility] clinic.          How to Prepare: Please make sure you drink 8 ounces of water about one hour prior to your pool session A caregiver may attend if needed with the patient to help assist as needed. A caregiver can sit in the pool room on chair. Please arrive IN YOUR SUIT and 15 minutes prior to your appointment - this helps to avoid delays in starting your session. Please make sure to attend to any toileting needs prior to entering the pool Locker rooms for changing are provided.   There is direct access to the pool deck form the locker room.  You can lock your belongings in a locker with lock provided. Once on the pool deck your therapist will ask if you have signed the Patient  Consent and Assignment of Benefits form before beginning treatment Your therapist may take your blood pressure prior to, during and after your session if indicated We usually try and create a home exercise program based on activities we do in the pool.  Please be thinking about who might be able to assist you in the pool should you need to participate in an aquatic home exercise program at the time of discharge if you need assistance.  Some patients do not want to or do not have the ability to participate in an aquatic home program - this is not a barrier in any way to you participating in aquatic therapy as part of your current therapy plan! After Discharge from PT, you can continue using home program at  the [facility]/, there is a drop-in fee for $5 ($45 a month)or for 60 years  or older $4.00 ($40 a month for seniors ) or any local [facility] pool.  Memberships for purchase are  available for gym/pool at [facility]  IT IS VERY IMPORTANT THAT YOUR LAST VISIT BE IN THE CLINIC AT [address] AFTER YOUR LAST AQUATIC VISIT.  PLEASE MAKE SURE THAT YOU HAVE A [address]  APPOINTMENT SCHEDULED.   About the pool: Pool is located approximately 500 FT from the entrance of the building.  Please bring a support person if you need assistance traveling this      distance.   Your therapist will assist you in entering the water; there are two ways to           enter: stairs with railings, and a mechanical lift. Your therapist will determine the most appropriate way for you.  Water temperature is usually between 88-90 degrees  There may be up to 2 other swimmers in the pool at the same time  The pool deck is tile, please wear shoes with good traction if you prefer not to be barefoot.    Contact Info:  For appointment scheduling and cancellations:         Please call the [facility]  PH:[phone]              Aquatic Therapy  Outpatient [facility]       All sessions are 45 minutes                                                    

## 2023-04-08 NOTE — Therapy (Signed)
OUTPATIENT PHYSICAL THERAPY EVALUATION   Patient Name: Virginia Bradley MRN: [id number] DOB:06-25-1986,36 y.o., female Today's Date: 04/09/2023   END OF SESSION:  PT End of Session - 04/08/23 1512     Visit Number 1    Number of Visits 17    Date for PT Re-Evaluation 06/06/23    Authorization Type [employer]    PT Start Time 1515    PT Stop Time 1600    PT Time Calculation (min) 45 min    Activity Tolerance Patient tolerated treatment well    Behavior During Therapy [facility] for tasks assessed/performed              Past Medical History:  Diagnosis Date   Anxiety    Complication of anesthesia    Depression    Family history of adverse reaction to anesthesia    GERD (gastroesophageal reflux disease)    History of COVID-19    per pt mild symptoms that resolved 03/ 2022  and 09/ 2022   Ovarian cyst, left    Seizure disorder    (03-03-2022   pt stated last seizure seizure approxl 2020/ 2021, none since , no medications) seen 3 different neurologist documented in epic, first seen by dr Merrily Brittle 03-24-2016;  dr Lamount Cranker 06-01-2019;   dr e. feraru ([facility] in care everywhere [place] 10-05-2019;;  febrile seizures as infant then started 2001 as teen;   hx negative EEG's in epic   Past Surgical History:  Procedure Laterality Date   APPENDECTOMY  2002   LAPAROSCOPIC OOPHORECTOMY Right 2007   per pt  thinks it was right ovary removed   ROBOTIC ASSISTED LAPAROSCOPIC OVARIAN CYSTECTOMY Left 03/06/2022   Procedure: XI ROBOTIC ASSISTED LAPAROSCOPIC OVARIAN CYSTECTOMY;  Surgeon: Toy Baker, DO;  Location: [facility];  Service: Gynecology;  Laterality: Left;   Patient Active Problem List   Diagnosis Date Noted   Recurrent major depressive disorder, in partial remission 05/19/2019   Localization-related idiopathic epilepsy and epileptic syndromes with seizures of localized onset, not intractable, without status epilepticus 07/20/2018   Seizure disorder  03/24/2016   Migraine 03/24/2016    PCP: Renaye Rakers, MD  REFERRING PROVIDER: Pollyann Savoy, MD   REFERRING DIAG:  M54.50,G89.29 (ICD-10-CM) - Chronic midline low 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 Patient reports she has tried heat, chiropractic care,physical therapy, topical analgesic, and other remedies, but this has not relieved her pain. She recently saw rheumatologist who said it was arthritis. She denies any numbness/tingling. She denies any changes in bowel/bladder. She reports the pain is about the middle of the low back and is worse when laying on her stomach and sitting on the toilet. She was also recently diagnosed with fibromyalgia and has pain first thing in the morning in her legs and arms, that turns into soreness throughout the day.   Neck: "It feels like someone is sawing at my bones (points to bilateral upper traps) and it tingles here (points to C-spine)." She reports this pain began when her back pain started of insidious onset. She reports occasional numbness in bilateral fingertips (palmar aspect). She reports her neck pain comes and goes and is worse when she is cold. No pain currently in the neck, but at worst 10 noticing this mostly at night when she is trying to rest.     PERTINENT HISTORY:  Anxiety Depression  Seizure disorder (reports no recent  seizures)    PAIN:  Are you having pain? Yes: NPRS scale: 8/10 Pain location: low back Pain description: spasm,sore,tight Aggravating factors: laying on stomach, sitting Relieving factors: nothing    PRECAUTIONS: None  WEIGHT BEARING RESTRICTIONS: No  FALLS:  Has patient fallen in last 6 months? No  LIVING ENVIRONMENT: Lives with: lives alone Lives in: House/apartment Stairs: Yes: External: 14 steps; can reach both Has following equipment at home: None  OCCUPATION: teacher   PLOF: Independent  PATIENT GOALS: "less back aches."    OBJECTIVE:   DIAGNOSTIC FINDINGS:  Lumbar X-ray: No significant disc space narrowing was noted.  Facet joint narrowing was  noted.  No SI joint sclerosis was noted.   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   Cervical X-ray: C5-C6 and C6-C7 narrowing with anterior osteophytes was noted.  Mild facet  joint narrowing was noted.   Impression: These findings are consistent with degenerative disc disease  of the cervical spine.   PATIENT SURVEYS:  Modified Oswestry 21/50; 42% disability  NDI 19/50; 38% disability  SCREENING FOR RED FLAGS: Bowel or bladder incontinence: No Spinal tumors: No Cauda equina syndrome: No Compression fracture: No Abdominal aneurysm: No  COGNITION: Overall cognitive status: Within functional limits for tasks assessed     SENSATION: Not tested  MUSCLE LENGTH: Hamstrings: mild tightness bilaterally   POSTURE: rounded shoulders, forward head, and decreased lumbar lordosis  PALPATION: TTP CERVICAL ROM:   Active ROM A/PROM (deg) eval  Flexion 60  Extension 59  Right lateral flexion 35  Left lateral flexion 30 pn  Right rotation 75  Left rotation 51pn   (Blank rows = not tested)  LUMBAR ROM:   AROM eval  Flexion 50% limited pn  Extension WNL  Right lateral flexion WNL  Left lateral flexion WNL  Right rotation WNL  Left rotation WNL    (Blank rows = not tested)  LOWER EXTREMITY ROM:     Active  Right eval Left eval  Hip flexion    Hip extension    Hip abduction    Hip adduction    Hip internal rotation    Hip external rotation    Knee flexion  Knee extension    Ankle dorsiflexion    Ankle plantarflexion    Ankle inversion    Ankle eversion     (Blank rows = not tested)  LOWER EXTREMITY MMT:    MMT Right eval Left eval  Hip flexion 5 4  Hip extension 4 4-  Hip abduction 4+ 4-  Hip adduction    Hip internal rotation    Hip external rotation    Knee flexion    Knee extension    Ankle dorsiflexion    Ankle plantarflexion    Ankle inversion    Ankle eversion     (Blank rows = not tested) UPPER EXTREMITY MMT:  MMT Right eval Left eval  Shoulder flexion 4+ 4  Shoulder extension    Shoulder abduction 5 4  Shoulder adduction    Shoulder extension    Shoulder internal rotation    Shoulder external rotation    Middle trapezius 4 4-  Lower trapezius    Elbow flexion    Elbow extension    Wrist flexion    Wrist extension    Wrist ulnar deviation    Wrist radial deviation    Wrist pronation    Wrist supination    Grip strength     (Blank rows = not tested)  SPECIAL TESTS:  (-) cervical compression/distraction   FUNCTIONAL TESTS:  5 x STS: 13 seconds  Squat: poor hip hinge, trunk flexion   GAIT: Distance walked: 10 ft  Assistive device utilized: None Level of assistance: Complete Independence Comments: WNL  [facility] Adult PT Treatment:                                                DATE: 04/08/23  Therapeutic Exercise: Demonstrated and issued initial HEP.    Therapeutic Activity: Education on assessment findings that will be addressed throughout duration of POC.       PATIENT EDUCATION:  Education details: see treatment Person educated: Patient Education method: Explanation, Demonstration, Tactile cues, Verbal cues, and Handouts Education comprehension: verbalized  understanding, returned demonstration, verbal cues required, tactile cues required, and needs further education  HOME EXERCISE PROGRAM: Access Code: [email or web address] URL: [email or web address] Date: 04/08/2023 Prepared by: Letitia Libra  Exercises - Supine Lower Trunk Rotation  - 1 x daily - 7 x weekly - 2 sets - 10 reps - Supine Single Knee to Chest Stretch  - 1 x daily - 7 x weekly - 2 sets - 10 reps - 5 sec  hold - Seated Scapular Retraction  - 1 x daily - 7 x weekly - 2 sets - 10 reps - Seated Cervical Retraction  - 1 x daily - 7 x weekly - 2 sets - 10 reps  ASSESSMENT:  CLINICAL IMPRESSION: Patient is a 37 y.o. female who was seen today for physical therapy evaluation and treatment for chronic back and neck pain and fibromyalgia. Upon assessment patient noted to have limited and painful trunk flexion, limited and painful cervical left rotation and lateral flexion AROM, hip/shoulder/periscapular weakness, postural abnormalities, and aberrant body mechanics with functional tasks. Patient will benefit from skilled PT to address the above stated deficits in order to optimize their function and assist in overall pain reduction as well as train in home program that she can continue with independently to assist in management of her chronic condition.    OBJECTIVE IMPAIRMENTS:  decreased activity tolerance, decreased endurance, decreased knowledge of condition, difficulty walking, decreased ROM, decreased strength, hypomobility, increased fascial restrictions, impaired flexibility, improper body mechanics, postural dysfunction, and pain.   ACTIVITY LIMITATIONS: carrying, lifting, bending, sitting, standing, squatting, transfers, and locomotion level  PARTICIPATION LIMITATIONS: meal prep, cleaning, laundry, driving, shopping, community activity, and occupation  PERSONAL FACTORS: Age, Fitness, Past/current experiences, Profession, Time since onset of injury/illness/exacerbation, and 3+  comorbidities: see PMH above  are also affecting patient's functional outcome.   REHAB POTENTIAL: Fair chronicity of injury; co-morbidities  CLINICAL DECISION MAKING: Evolving/moderate complexity  EVALUATION COMPLEXITY: Moderate   GOALS: Goals reviewed with patient? Yes  SHORT TERM GOALS: Target date: 05/07/2023    Patient will be independent and compliant with initial HEP.   Baseline: see above Goal status: INITIAL  2.  Patient will demonstrate at least 60 degrees of Lt cervical rotation AROM to improve ability to complete head turns while driving.  Baseline: see above  Goal status: INITIAL  3.  Patient will improve trunk flexion by 25% without pain to improve ability to complete bending activities.  Baseline: see above  Goal status: INITIAL   LONG TERM GOALS: Target date: 06/06/23  Patient will score </=30% on the Modified Oswestry (MCID is 12%) to signify clinically meaningful improvement in functional abilities.  Baseline: see above Goal status: INITIAL  2.  Patient will score </=25% on the NDI to signify clinically meaningful improvement in functional abilities.   Baseline: see above Goal status: INITIAL  3.  Patient will self-report ability to sit for at least 30 minutes to improve her tolerance to driving.  Baseline: 10-15 minutes  Goal status: INITIAL  4.  Patient will be independent with advanced home program to assist in management of her chronic condition.  Baseline: see above Goal status: INITIAL  5.  Patient will demonstrate proper squat mechanics to reduce stress on her back with bending/lifting activity.  Baseline: see above  Goal status: INITIAL   PLAN:  PT FREQUENCY: 2x/week  PT DURATION: 8 weeks  PLANNED INTERVENTIONS: Therapeutic exercises, Therapeutic activity, Neuromuscular re-education, Balance training, Gait training, Patient/Family education, Self Care, Aquatic Therapy, Dry Needling, Cryotherapy, Moist heat, Manual therapy, and  Re-evaluation.  PLAN FOR NEXT SESSION: review and progress HEP prn; lumbar mobility, core/hip strengthening, posture strengthening, cervical mobility    Letitia Libra, PT, DPT, ATC 04/09/23 8:55 AM  [employer] Authorization   Choose one: Rehabilitative  Standardized Assessment or Functional Outcome Tool: See Pain Assessment, Oswestry, and NDI  Score or Percent Disability: Modified Oswestry 21/50; 42% disability  NDI 19/50; 38% disability  Body Parts Treated (Select each separately):  Lumbopelvic. Overall deficits/functional limitations for body part selected: moderate Cervicothoracic. Overall deficits/functional limitations for body part selected: moderate   If treatment provided at initial evaluation, no treatment charged due to lack of authorization.

## 2023-04-15 ENCOUNTER — Ambulatory Visit: Payer: Medicaid Other

## 2023-04-15 DIAGNOSIS — M5459 Other low back pain: Secondary | ICD-10-CM

## 2023-04-15 DIAGNOSIS — M542 Cervicalgia: Secondary | ICD-10-CM

## 2023-04-15 DIAGNOSIS — G8929 Other chronic pain: Secondary | ICD-10-CM | POA: Diagnosis not present

## 2023-04-15 DIAGNOSIS — M62838 Other muscle spasm: Secondary | ICD-10-CM | POA: Diagnosis not present

## 2023-04-15 DIAGNOSIS — M6281 Muscle weakness (generalized): Secondary | ICD-10-CM

## 2023-04-15 DIAGNOSIS — M797 Fibromyalgia: Secondary | ICD-10-CM

## 2023-04-15 DIAGNOSIS — M545 Low back pain, unspecified: Secondary | ICD-10-CM | POA: Diagnosis not present

## 2023-04-15 NOTE — Therapy (Signed)
OUTPATIENT PHYSICAL THERAPY TREATMENT NOTE   Patient Name: Virginia Bradley MRN: [id number] DOB:03-18-1986, 37 y.o., female Today's Date: 04/15/2023  PCP: Renaye Rakers, MD  REFERRING PROVIDER: Pollyann Savoy, MD   END OF SESSION:   PT End of Session - 04/15/23 1442     Visit Number 2    Number of Visits 17    Date for PT Re-Evaluation 06/06/23    Authorization Type [employer]    PT Start Time 1445    PT Stop Time 1525    PT Time Calculation (min) 40 min    Activity Tolerance Patient tolerated treatment well    Behavior During Therapy [facility] for tasks assessed/performed             Past Medical History:  Diagnosis Date   Anxiety    Complication of anesthesia    Depression    Family history of adverse reaction to anesthesia    GERD (gastroesophageal reflux disease)    History of COVID-19    per pt mild symptoms that resolved 03/ 2022  and 09/ 2022   Ovarian cyst, left    Seizure disorder    (03-03-2022   pt stated last seizure seizure approxl 2020/ 2021, none since , no medications) seen 3 different neurologist documented in epic, first seen by dr Merrily Brittle 03-24-2016;  dr Lamount Cranker 06-01-2019;   dr e. feraru ([facility]) in care everywhere [facility] 10-05-2019;;  febrile seizures as infant then started 2001 as teen;   hx negative EEG's in epic   Past Surgical History:  Procedure Laterality Date   APPENDECTOMY  2002   LAPAROSCOPIC OOPHORECTOMY Right 2007   per pt  thinks it was right ovary removed   ROBOTIC ASSISTED LAPAROSCOPIC OVARIAN CYSTECTOMY Left 03/06/2022   Procedure: XI ROBOTIC ASSISTED LAPAROSCOPIC OVARIAN CYSTECTOMY;  Surgeon: Toy Baker, DO;  [facility];  Service: Gynecology;  Laterality: Left;   Patient Active Problem List   Diagnosis Date Noted   Recurrent major depressive disorder, in partial remission 05/19/2019   Localization-related idiopathic epilepsy and epileptic syndromes with seizures of localized onset, not  intractable, without status epilepticus 07/20/2018   Seizure disorder 03/24/2016   Migraine 03/24/2016    REFERRING DIAG:  M54.50,G89.29 (ICD-10-CM) - Chronic midline low back pain without sciatica  M62.838 (ICD-10-CM) - Trapezius muscle spasm  M79.7 (ICD-10-CM) - Fibromyalgia    THERAPY DIAG:  Other low back pain  Muscle weakness (generalized)  Cervicalgia  Fibromyalgia  Rationale for Evaluation and Treatment Rehabilitation  PERTINENT HISTORY: Anxiety Depression  Seizure disorder (reports no recent seizures)   PRECAUTIONS: None  SUBJECTIVE:  SUBJECTIVE STATEMENT:  Patient reports her pain is in her midback today.    PAIN:  Are you having pain? Yes: NPRS scale: 6/10 Pain location: low back Pain description: spasm,sore,tight Aggravating factors: laying on stomach, sitting Relieving factors: nothing   OBJECTIVE: (objective measures completed at initial evaluation unless otherwise dated)   DIAGNOSTIC FINDINGS:  Lumbar X-ray: No significant disc space narrowing was noted.  Facet joint narrowing was  noted.  No SI joint sclerosis was noted.   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    Cervical X-ray: C5-C6 and C6-C7 narrowing with anterior osteophytes was noted.  Mild facet  joint narrowing was noted.   Impression: These findings are consistent with degenerative disc disease  of the cervical spine.    PATIENT SURVEYS:  Modified Oswestry 21/50; 42% disability  NDI 19/50; 38% disability   SCREENING FOR RED FLAGS: Bowel or bladder incontinence: No Spinal tumors: No Cauda equina syndrome: No Compression fracture: No Abdominal aneurysm: No   COGNITION: Overall cognitive status: Within functional limits for tasks assessed                           SENSATION: Not tested   MUSCLE LENGTH: Hamstrings: mild tightness bilaterally    POSTURE: rounded shoulders, forward head, and decreased lumbar lordosis   PALPATION: TTP CERVICAL ROM:    Active ROM A/PROM (deg) eval  Flexion 60  Extension 59  Right lateral flexion 35  Left lateral flexion 30 pn  Right rotation 75  Left rotation 51pn   (Blank rows = not tested)   LUMBAR ROM:    AROM eval  Flexion 50% limited pn  Extension WNL  Right lateral flexion WNL  Left lateral flexion WNL  Right rotation WNL  Left rotation WNL   (Blank rows = not tested)   LOWER EXTREMITY ROM:      Active  Right eval Left eval  Hip flexion      Hip extension      Hip abduction      Hip adduction      Hip internal rotation      Hip external rotation      Knee flexion      Knee extension      Ankle dorsiflexion      Ankle plantarflexion      Ankle inversion      Ankle eversion       (Blank rows = not tested)   LOWER EXTREMITY MMT:     MMT Right eval Left eval  Hip flexion 5 4  Hip extension 4 4-  Hip abduction 4+ 4-  Hip adduction      Hip internal rotation      Hip external rotation      Knee flexion      Knee extension      Ankle dorsiflexion      Ankle plantarflexion      Ankle inversion      Ankle eversion       (Blank rows = not tested) UPPER EXTREMITY MMT:   MMT Right eval Left eval  Shoulder flexion 4+ 4  Shoulder extension      Shoulder abduction 5 4  Shoulder adduction      Shoulder extension      Shoulder internal rotation      Shoulder external rotation      Middle trapezius 4 4-  Lower trapezius  Elbow flexion      Elbow extension      Wrist flexion      Wrist extension      Wrist ulnar deviation      Wrist radial deviation      Wrist pronation      Wrist supination      Grip strength       (Blank rows = not tested)   SPECIAL TESTS:  (-) cervical compression/distraction    FUNCTIONAL TESTS:  5 x STS: 13 seconds    Squat: poor hip hinge, trunk flexion    GAIT: Distance walked: 10 ft  Assistive device utilized: None Level of assistance: Complete Independence Comments: WNL   [facility] Adult PT Treatment:                                                DATE: 04/15/23 Therapeutic Exercise: Nustep level 5 x 5 mins Rows GTB 2x10 Shoulder extension GTB 2x10 Seated horizontal abduction RTB 2x10 Bridges 2x10 SLR with TA activation x10 BIL SKTC 10" hold x10 BIL LTR x10 BIL Sidelying open books x10 BIL Sidelying hip abduction x10 BIL Seated blue pball roll outs fwd/lat x10 each   [facility] Adult PT Treatment:                                                DATE: 04/08/23   Therapeutic Exercise: Demonstrated and issued initial HEP.      Therapeutic Activity: Education on assessment findings that will be addressed throughout duration of POC.          PATIENT EDUCATION:  Education details: see treatment Person educated: Patient Education method: Explanation, Demonstration, Tactile cues, Verbal cues, and Handouts Education comprehension: verbalized understanding, returned demonstration, verbal cues required, tactile cues required, and needs further education   HOME EXERCISE PROGRAM: Access Code: [email or web address] URL: [email or web address] Date: 04/08/2023 Prepared by: Letitia Libra   Exercises - Supine Lower Trunk Rotation  - 1 x daily - 7 x weekly - 2 sets - 10 reps - Supine Single Knee to Chest Stretch  - 1 x daily - 7 x weekly - 2 sets - 10 reps - 5 sec  hold - Seated Scapular Retraction  - 1 x daily - 7 x weekly - 2 sets - 10 reps - Seated Cervical Retraction  - 1 x daily - 7 x weekly - 2 sets - 10 reps   ASSESSMENT:   CLINICAL IMPRESSION: Patient presents to first follow up PT session reporting continued back pain, stating it is less in her lower back and more in her mid back today. Session today focused on core and proximal hip strengthening without exacerbating patients pain as she  states she tends to be sore all the time from her fibromyalgia. She is excited to begin aquatic therapy this week. Patient was able to tolerate all prescribed exercises with no adverse effects. Patient continues to benefit from skilled PT services and should be progressed as able to improve functional independence.     OBJECTIVE IMPAIRMENTS: decreased activity tolerance, decreased endurance, decreased knowledge of condition, difficulty walking, decreased ROM, decreased strength, hypomobility, increased fascial restrictions, impaired flexibility, improper body mechanics, postural dysfunction, and pain.    ACTIVITY LIMITATIONS:  carrying, lifting, bending, sitting, standing, squatting, transfers, and locomotion level   PARTICIPATION LIMITATIONS: meal prep, cleaning, laundry, driving, shopping, community activity, and occupation   PERSONAL FACTORS: Age, Fitness, Past/current experiences, Profession, Time since onset of injury/illness/exacerbation, and 3+ comorbidities: see PMH above  are also affecting patient's functional outcome.    REHAB POTENTIAL: Fair chronicity of injury; co-morbidities   CLINICAL DECISION MAKING: Evolving/moderate complexity   EVALUATION COMPLEXITY: Moderate     GOALS: Goals reviewed with patient? Yes   SHORT TERM GOALS: Target date: 05/07/2023       Patient will be independent and compliant with initial HEP.    Baseline: see above Goal status: INITIAL   2.  Patient will demonstrate at least 60 degrees of Lt cervical rotation AROM to improve ability to complete head turns while driving.  Baseline: see above  Goal status: INITIAL   3.  Patient will improve trunk flexion by 25% without pain to improve ability to complete bending activities.  Baseline: see above  Goal status: INITIAL     LONG TERM GOALS: Target date: 06/06/23   Patient will score </=30% on the Modified Oswestry (MCID is 12%) to signify clinically meaningful improvement in functional abilities.   Baseline: see above Goal status: INITIAL   2.  Patient will score </=25% on the NDI to signify clinically meaningful improvement in functional abilities.    Baseline: see above Goal status: INITIAL   3.  Patient will self-report ability to sit for at least 30 minutes to improve her tolerance to driving.  Baseline: 10-15 minutes  Goal status: INITIAL   4.  Patient will be independent with advanced home program to assist in management of her chronic condition.  Baseline: see above Goal status: INITIAL   5.  Patient will demonstrate proper squat mechanics to reduce stress on her back with bending/lifting activity.  Baseline: see above  Goal status: INITIAL     PLAN:   PT FREQUENCY: 2x/week   PT DURATION: 8 weeks   PLANNED INTERVENTIONS: Therapeutic exercises, Therapeutic activity, Neuromuscular re-education, Balance training, Gait training, Patient/Family education, Self Care, Aquatic Therapy, Dry Needling, Cryotherapy, Moist heat, Manual therapy, and Re-evaluation.   PLAN FOR NEXT SESSION: review and progress HEP prn; lumbar mobility, core/hip strengthening, posture strengthening, cervical mobility    Berta Minor, PTA 04/15/2023, 3:26 PM

## 2023-04-15 NOTE — Patient Instructions (Signed)
Aquatic Therapy at [facility]-  What to Expect!  Where:   [facility] [address] [place], [place] [address] Rehab phone [phone]  NOTE:  You will receive an automated phone message reminding you of your appt and it will say the appointment is at the [address] [facility] [address] [facility] clinic.          How to Prepare: Please make sure you drink 8 ounces of water about one hour prior to your pool session A caregiver may attend if needed with the patient to help assist as needed. A caregiver can sit in the pool room on chair. Please arrive IN YOUR SUIT and 15 minutes prior to your appointment - this helps to avoid delays in starting your session. Please make sure to attend to any toileting needs prior to entering the pool Locker rooms for changing are provided.   There is direct access to the pool deck form the locker room.  You can lock your belongings in a locker with lock provided. Once on the pool deck your therapist will ask if you have signed the Patient  Consent and Assignment of Benefits form before beginning treatment Your therapist may take your blood pressure prior to, during and after your session if indicated We usually try and create a home exercise program based on activities we do in the pool.  Please be thinking about who might be able to assist you in the pool should you need to participate in an aquatic home exercise program at the time of discharge if you need assistance.  Some patients do not want to or do not have the ability to participate in an aquatic home program - this is not a barrier in any way to you participating in aquatic therapy as part of your current therapy plan! After Discharge from PT, you can continue using home program at  the [facility]/, there is a drop-in fee for $5 ($45 a month)or for 60 years  or older $4.00 ($40 a month for seniors ) or any local [employer] pool.  Memberships for purchase are  available for gym/pool at [facility]  IT IS VERY IMPORTANT THAT YOUR LAST VISIT BE IN THE CLINIC AT [address] AFTER YOUR LAST AQUATIC VISIT.  PLEASE MAKE SURE THAT YOU HAVE A [facility]/[address]  APPOINTMENT SCHEDULED.   About the pool: Pool is located approximately 500 FT from the entrance of the building.  Please bring a support person if you need assistance traveling this      distance.   Your therapist will assist you in entering the water; there are two ways to           enter: stairs with railings, and a mechanical lift. Your therapist will determine the most appropriate way for you.  Water temperature is usually between 88-90 degrees  There may be up to 2 other swimmers in the pool at the same time  The pool deck is tile, please wear shoes with good traction if you prefer not to be barefoot.    Contact Info:  For appointment scheduling and cancellations:         Please call the [facility]  PH:[phone]              Aquatic Therapy  Outpatient Rehabilitation [facility]       All sessions are 45 minutes                                                    

## 2023-04-17 ENCOUNTER — Ambulatory Visit: Payer: Medicaid Other

## 2023-04-17 DIAGNOSIS — M5459 Other low back pain: Secondary | ICD-10-CM | POA: Diagnosis not present

## 2023-04-17 DIAGNOSIS — M797 Fibromyalgia: Secondary | ICD-10-CM | POA: Diagnosis not present

## 2023-04-17 DIAGNOSIS — M545 Low back pain, unspecified: Secondary | ICD-10-CM | POA: Diagnosis not present

## 2023-04-17 DIAGNOSIS — M542 Cervicalgia: Secondary | ICD-10-CM | POA: Diagnosis not present

## 2023-04-17 DIAGNOSIS — G8929 Other chronic pain: Secondary | ICD-10-CM | POA: Diagnosis not present

## 2023-04-17 DIAGNOSIS — M62838 Other muscle spasm: Secondary | ICD-10-CM | POA: Diagnosis not present

## 2023-04-17 DIAGNOSIS — M6281 Muscle weakness (generalized): Secondary | ICD-10-CM | POA: Diagnosis not present

## 2023-04-17 NOTE — Therapy (Signed)
OUTPATIENT PHYSICAL THERAPY TREATMENT NOTE   Patient Name: Virginia Bradley MRN: [id number] DOB:[birth year], 37 y.o., female Today's Date: 04/17/2023  PCP: Renaye Rakers, MD  REFERRING PROVIDER: Pollyann Savoy, MD   END OF SESSION:   PT End of Session - 04/17/23 1547     Visit Number 3    Number of Visits 17    Date for PT Re-Evaluation 06/06/23    Authorization Type [employer]    Authorization Time Period 10 visits 04/13/23-06/12/23    Authorization - Visit Number 2    Authorization - Number of Visits 10    PT Start Time 1600    PT Stop Time 1645    PT Time Calculation (min) 45 min    Activity Tolerance Patient tolerated treatment well    Behavior During Therapy [facility] for tasks assessed/performed              Past Medical History:  Diagnosis Date   Anxiety    Complication of anesthesia    Depression    Family history of adverse reaction to anesthesia    GERD (gastroesophageal reflux disease)    History of COVID-19    per pt mild symptoms that resolved 03/ 2022  and 09/ 2022   Ovarian cyst, left    Seizure disorder (HCC)    (03-03-2022   pt stated last seizure seizure approxl 2020/ 2021, none since , no medications) seen 3 different neurologist documented in epic, first seen by dr Merrily Brittle 03-24-2016;  dr Lamount Cranker 06-01-2019;   dr e. feraru ([facility] in care everywhere [place] 10-05-2019;;  febrile seizures as infant then started 2001 as teen;   hx negative EEG's in epic   Past Surgical History:  Procedure Laterality Date   APPENDECTOMY  2002   LAPAROSCOPIC OOPHORECTOMY Right 2007   per pt  thinks it was right ovary removed   ROBOTIC ASSISTED LAPAROSCOPIC OVARIAN CYSTECTOMY Left 03/06/2022   Procedure: XI ROBOTIC ASSISTED LAPAROSCOPIC OVARIAN CYSTECTOMY;  Surgeon: Toy Baker, DO;  Location: [facility];  Service: Gynecology;  Laterality: Left;   Patient Active Problem List   Diagnosis Date Noted   Recurrent major depressive disorder, in  partial remission (HCC) 05/19/2019   Localization-related idiopathic epilepsy and epileptic syndromes with seizures of localized onset, not intractable, without status epilepticus (HCC) 07/20/2018   Seizure disorder (HCC) 03/24/2016   Migraine 03/24/2016    REFERRING DIAG:  M54.50,G89.29 (ICD-10-CM) - Chronic midline low back pain without sciatica  M62.838 (ICD-10-CM) - Trapezius muscle spasm  M79.7 (ICD-10-CM) - Fibromyalgia    THERAPY DIAG:  Other low back pain  Muscle weakness (generalized)  Cervicalgia  Fibromyalgia  Rationale for Evaluation and Treatment Rehabilitation  PERTINENT HISTORY: Anxiety Depression  Seizure disorder (reports no recent seizures)   PRECAUTIONS: None  SUBJECTIVE:  SUBJECTIVE STATEMENT: Patient reports continued midback pain, but reports she didn't have much soreness after the last land session.    PAIN:  Are you having pain? Yes: NPRS scale: 6-7/10 Pain location: low back Pain description: spasm,sore,tight Aggravating factors: laying on stomach, sitting Relieving factors: nothing   OBJECTIVE: (objective measures completed at initial evaluation unless otherwise dated)   DIAGNOSTIC FINDINGS:  Lumbar X-ray: No significant disc space narrowing was noted.  Facet joint narrowing was  noted.  No SI joint sclerosis was noted.   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    Cervical X-ray: C5-C6 and C6-C7 narrowing with anterior osteophytes was noted.  Mild facet  joint narrowing was noted.   Impression: These findings are consistent with degenerative disc disease  of the cervical spine.    PATIENT SURVEYS:  Modified Oswestry 21/50; 42% disability  NDI 19/50; 38% disability   SCREENING FOR RED FLAGS: Bowel or  bladder incontinence: No Spinal tumors: No Cauda equina syndrome: No Compression fracture: No Abdominal aneurysm: No   COGNITION: Overall cognitive status: Within functional limits for tasks assessed                          SENSATION: Not tested   MUSCLE LENGTH: Hamstrings: mild tightness bilaterally    POSTURE: rounded shoulders, forward head, and decreased lumbar lordosis   PALPATION: TTP CERVICAL ROM:    Active ROM A/PROM (deg) eval  Flexion 60  Extension 59  Right lateral flexion 35  Left lateral flexion 30 pn  Right rotation 75  Left rotation 51pn   (Blank rows = not tested)   LUMBAR ROM:    AROM eval  Flexion 50% limited pn  Extension WNL  Right lateral flexion WNL  Left lateral flexion WNL  Right rotation WNL  Left rotation WNL   (Blank rows = not tested)   LOWER EXTREMITY ROM:      Active  Right eval Left eval  Hip flexion      Hip extension      Hip abduction      Hip adduction      Hip internal rotation      Hip external rotation      Knee flexion      Knee extension      Ankle dorsiflexion      Ankle plantarflexion      Ankle inversion      Ankle eversion       (Blank rows = not tested)   LOWER EXTREMITY MMT:     MMT Right eval Left eval  Hip flexion 5 4  Hip extension 4 4-  Hip abduction 4+ 4-  Hip adduction      Hip internal rotation      Hip external rotation      Knee flexion      Knee extension      Ankle dorsiflexion      Ankle plantarflexion      Ankle inversion      Ankle eversion       (Blank rows = not tested) UPPER EXTREMITY MMT:   MMT Right eval Left eval  Shoulder flexion 4+ 4  Shoulder extension      Shoulder abduction 5 4  Shoulder adduction      Shoulder extension      Shoulder internal rotation      Shoulder external rotation      Middle  trapezius 4 4-  Lower trapezius      Elbow flexion      Elbow extension      Wrist flexion      Wrist extension      Wrist ulnar deviation      Wrist radial  deviation      Wrist pronation      Wrist supination      Grip strength       (Blank rows = not tested)   SPECIAL TESTS:  (-) cervical compression/distraction    FUNCTIONAL TESTS:  5 x STS: 13 seconds   Squat: poor hip hinge, trunk flexion    GAIT: Distance walked: 10 ft  Assistive device utilized: None Level of assistance: Complete Independence Comments: WNL   [facility] Adult PT Treatment:                                                DATE: 04/17/23 Aquatic therapy at [facility] [address] [address] - therapeutic pool temp approximately 92 degrees. Pt enters building ambulating independently. Treatment took place in water 3.8 to  4 ft 8 in.feet deep depending upon activity.  Pt entered and exited the pool via stair and handrails independently. Patient entered water for aquatic therapy for first time and was introduced to principles and therapeutic effects of water as they ambulated and acclimated to pool. Therapeutic Exercise: Aquatic Exercise: Walking forward/backwards/side stepping Runners stretch on bottom step x30" BIL Hamstring stretch on bottom step x30" BIL Figure 4 squat stretch, BIL UE support x30" BIL Standing thoracic rotation with noodle x1' Cat/cows with pool noodle x1' Shoulder flexion/extension, horizontal abd/add, abd/adduction with pink bells x20 each Standing with UE support edge of pool: Hip abd/add x20 BIL Heel raise 2x20 Hip ext/flex with knee straight x 20 BIL Hip Circles CC/CCW x10 each BIL Marching hip flexion to knee extension x20 BIL Hamstring curl x20 BIL Squats 2x20   Pt requires the buoyancy of water for active assisted exercises with buoyancy supported for strengthening and AROM exercises. Hydrostatic pressure also supports joints by unweighting joint load by at least 50 % in 3-4 feet depth water. 80% in chest to neck deep water. Water will provide assistance with movement using the current and laminar flow while the buoyancy reduces weight bearing.  Pt requires the viscosity of the water for resistance with strengthening exercises.   [facility] Adult PT Treatment:                                                DATE: 04/15/23 Therapeutic Exercise: Nustep level 5 x 5 mins Rows GTB 2x10 Shoulder extension GTB 2x10 Seated horizontal abduction RTB 2x10 Bridges 2x10 SLR with TA activation x10 BIL SKTC 10" hold x10 BIL LTR x10 BIL Sidelying open books x10 BIL Sidelying hip abduction x10 BIL Seated blue pball roll outs fwd/lat x10 each   [facility] Adult PT Treatment:                                                DATE: 04/08/23   Therapeutic Exercise: Demonstrated and issued initial  HEP.      Therapeutic Activity: Education on assessment findings that will be addressed throughout duration of POC.          PATIENT EDUCATION:  Education details: see treatment Person educated: Patient Education method: Explanation, Demonstration, Tactile cues, Verbal cues, and Handouts Education comprehension: verbalized understanding, returned demonstration, verbal cues required, tactile cues required, and needs further education   HOME EXERCISE PROGRAM: Access Code: [email or web address] URL: [email or web address] Date: 04/08/2023 Prepared by: Letitia Libra   Exercises - Supine Lower Trunk Rotation  - 1 x daily - 7 x weekly - 2 sets - 10 reps - Supine Single Knee to Chest Stretch  - 1 x daily - 7 x weekly - 2 sets - 10 reps - 5 sec  hold - Seated Scapular Retraction  - 1 x daily - 7 x weekly - 2 sets - 10 reps - Seated Cervical Retraction  - 1 x daily - 7 x weekly - 2 sets - 10 reps   ASSESSMENT:   CLINICAL IMPRESSION: Patient presents to first aquatic PT session reporting continued lower and mid back pain. Session today focused on proximal hip and core strengthening, gentle lumbar mobility, and prolonged consistent movement in the aquatic environment for use of buoyancy to offload joints and the viscosity of water as resistance during therapeutic  exercise. Patient was able to tolerate all prescribed exercises in the aquatic environment with no adverse effects and reports 0/10 pain at the end of the session. Patient continues to benefit from skilled PT services on land and aquatic based and should be progressed as able to improve functional independence.    OBJECTIVE IMPAIRMENTS: decreased activity tolerance, decreased endurance, decreased knowledge of condition, difficulty walking, decreased ROM, decreased strength, hypomobility, increased fascial restrictions, impaired flexibility, improper body mechanics, postural dysfunction, and pain.    ACTIVITY LIMITATIONS: carrying, lifting, bending, sitting, standing, squatting, transfers, and locomotion level   PARTICIPATION LIMITATIONS: meal prep, cleaning, laundry, driving, shopping, community activity, and occupation   PERSONAL FACTORS: Age, Fitness, Past/current experiences, Profession, Time since onset of injury/illness/exacerbation, and 3+ comorbidities: see PMH above  are also affecting patient's functional outcome.    REHAB POTENTIAL: Fair chronicity of injury; co-morbidities   CLINICAL DECISION MAKING: Evolving/moderate complexity   EVALUATION COMPLEXITY: Moderate     GOALS: Goals reviewed with patient? Yes   SHORT TERM GOALS: Target date: 05/07/2023       Patient will be independent and compliant with initial HEP.    Baseline: see above Goal status: INITIAL   2.  Patient will demonstrate at least 60 degrees of Lt cervical rotation AROM to improve ability to complete head turns while driving.  Baseline: see above  Goal status: INITIAL   3.  Patient will improve trunk flexion by 25% without pain to improve ability to complete bending activities.  Baseline: see above  Goal status: INITIAL     LONG TERM GOALS: Target date: 06/06/23   Patient will score </=30% on the Modified Oswestry (MCID is 12%) to signify clinically meaningful improvement in functional abilities.   Baseline: see above Goal status: INITIAL   2.  Patient will score </=25% on the NDI to signify clinically meaningful improvement in functional abilities.    Baseline: see above Goal status: INITIAL   3.  Patient will self-report ability to sit for at least 30 minutes to improve her tolerance to driving.  Baseline: 10-15 minutes  Goal status: INITIAL   4.  Patient will be independent with advanced home program to  assist in management of her chronic condition.  Baseline: see above Goal status: INITIAL   5.  Patient will demonstrate proper squat mechanics to reduce stress on her back with bending/lifting activity.  Baseline: see above  Goal status: INITIAL     PLAN:   PT FREQUENCY: 2x/week   PT DURATION: 8 weeks   PLANNED INTERVENTIONS: Therapeutic exercises, Therapeutic activity, Neuromuscular re-education, Balance training, Gait training, Patient/Family education, Self Care, Aquatic Therapy, Dry Needling, Cryotherapy, Moist heat, Manual therapy, and Re-evaluation.   PLAN FOR NEXT SESSION: review and progress HEP prn; lumbar mobility, core/hip strengthening, posture strengthening, cervical mobility    Berta Minor, PTA 04/17/2023, 4:43 PM

## 2023-04-22 DIAGNOSIS — Z419 Encounter for procedure for purposes other than remedying health state, unspecified: Secondary | ICD-10-CM | POA: Diagnosis not present

## 2023-04-29 ENCOUNTER — Ambulatory Visit: Payer: Medicaid Other | Attending: Rheumatology

## 2023-04-29 DIAGNOSIS — M6281 Muscle weakness (generalized): Secondary | ICD-10-CM | POA: Insufficient documentation

## 2023-04-29 DIAGNOSIS — M542 Cervicalgia: Secondary | ICD-10-CM

## 2023-04-29 DIAGNOSIS — M797 Fibromyalgia: Secondary | ICD-10-CM | POA: Insufficient documentation

## 2023-04-29 DIAGNOSIS — M5459 Other low back pain: Secondary | ICD-10-CM | POA: Insufficient documentation

## 2023-04-29 NOTE — Therapy (Signed)
OUTPATIENT PHYSICAL THERAPY TREATMENT NOTE   Patient Name: Virginia Bradley MRN: [id number] DOB:[birth year], 37 y.o., female Today's Date: 04/29/2023  PCP: Renaye Rakers, MD  REFERRING PROVIDER: Pollyann Savoy, MD   END OF SESSION:   PT End of Session - 04/29/23 1603     Visit Number 4    Number of Visits 17    Date for PT Re-Evaluation 06/06/23    Authorization Type [employer]    Authorization Time Period 10 visits 04/13/23-06/12/23    Authorization - Visit Number 3    Authorization - Number of Visits 10    PT Start Time 1615    PT Stop Time 1655    PT Time Calculation (min) 40 min    Activity Tolerance Patient tolerated treatment well    Behavior During Therapy [facility] for tasks assessed/performed             Past Medical History:  Diagnosis Date   Anxiety    Complication of anesthesia    Depression    Family history of adverse reaction to anesthesia    GERD (gastroesophageal reflux disease)    History of COVID-19    per pt mild symptoms that resolved 03/ 2022  and 09/ 2022   Ovarian cyst, left    Seizure disorder (HCC)    (03-03-2022   pt stated last seizure seizure approxl 2020/ 2021, none since , no medications) seen 3 different neurologist documented in epic, first seen by dr Merrily Brittle 03-24-2016;  dr Lamount Cranker 06-01-2019;   dr e. feraru ([facility] [facility] in care everywhere [place] 10-05-2019;;  febrile seizures as infant then started 2001 as teen;   hx negative EEG's in epic   Past Surgical History:  Procedure Laterality Date   APPENDECTOMY  2002   LAPAROSCOPIC OOPHORECTOMY Right 2007   per pt  thinks it was right ovary removed   ROBOTIC ASSISTED LAPAROSCOPIC OVARIAN CYSTECTOMY Left 03/06/2022   Procedure: XI ROBOTIC ASSISTED LAPAROSCOPIC OVARIAN CYSTECTOMY;  Surgeon: Toy Baker, DO;  Location: [facility];  Service: Gynecology;  Laterality: Left;   Patient Active Problem List   Diagnosis Date Noted   Recurrent major depressive disorder, in  partial remission (HCC) 05/19/2019   Localization-related idiopathic epilepsy and epileptic syndromes with seizures of localized onset, not intractable, without status epilepticus (HCC) 07/20/2018   Seizure disorder (HCC) 03/24/2016   Migraine 03/24/2016    REFERRING DIAG:  M54.50,G89.29 (ICD-10-CM) - Chronic midline low back pain without sciatica  M62.838 (ICD-10-CM) - Trapezius muscle spasm  M79.7 (ICD-10-CM) - Fibromyalgia    THERAPY DIAG:  Other low back pain  Muscle weakness (generalized)  Cervicalgia  Fibromyalgia  Rationale for Evaluation and Treatment Rehabilitation  PERTINENT HISTORY: Anxiety Depression  Seizure disorder (reports no recent seizures)   PRECAUTIONS: None  SUBJECTIVE:  SUBJECTIVE STATEMENT: Patient presents to PT reporting increased pain in her mid/thoracic back today, states she just recently got back from travelling to [place].    PAIN:  Are you having pain? Yes: NPRS scale: 8/10 Pain location: low back Pain description: spasm,sore,tight Aggravating factors: laying on stomach, sitting Relieving factors: nothing   OBJECTIVE: (objective measures completed at initial evaluation unless otherwise dated)   DIAGNOSTIC FINDINGS:  Lumbar X-ray: No significant disc space narrowing was noted.  Facet joint narrowing was  noted.  No SI joint sclerosis was noted.   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    Cervical X-ray: C5-C6 and C6-C7 narrowing with anterior osteophytes was noted.  Mild facet  joint narrowing was noted.   Impression: These findings are consistent with degenerative disc disease  of the cervical spine.    PATIENT SURVEYS:  Modified Oswestry 21/50; 42% disability  NDI 19/50; 38% disability    SCREENING FOR RED FLAGS: Bowel or bladder incontinence: No Spinal tumors: No Cauda equina syndrome: No Compression fracture: No Abdominal aneurysm: No   COGNITION: Overall cognitive status: Within functional limits for tasks assessed                          SENSATION: Not tested   MUSCLE LENGTH: Hamstrings: mild tightness bilaterally    POSTURE: rounded shoulders, forward head, and decreased lumbar lordosis   PALPATION: TTP CERVICAL ROM:    Active ROM A/PROM (deg) eval  Flexion 60  Extension 59  Right lateral flexion 35  Left lateral flexion 30 pn  Right rotation 75  Left rotation 51pn   (Blank rows = not tested)   LUMBAR ROM:    AROM eval  Flexion 50% limited pn  Extension WNL  Right lateral flexion WNL  Left lateral flexion WNL  Right rotation WNL  Left rotation WNL   (Blank rows = not tested)   LOWER EXTREMITY ROM:      Active  Right eval Left eval  Hip flexion      Hip extension      Hip abduction      Hip adduction      Hip internal rotation      Hip external rotation      Knee flexion      Knee extension      Ankle dorsiflexion      Ankle plantarflexion      Ankle inversion      Ankle eversion       (Blank rows = not tested)   LOWER EXTREMITY MMT:     MMT Right eval Left eval  Hip flexion 5 4  Hip extension 4 4-  Hip abduction 4+ 4-  Hip adduction      Hip internal rotation      Hip external rotation      Knee flexion      Knee extension      Ankle dorsiflexion      Ankle plantarflexion      Ankle inversion      Ankle eversion       (Blank rows = not tested) UPPER EXTREMITY MMT:   MMT Right eval Left eval  Shoulder flexion 4+ 4  Shoulder extension      Shoulder abduction 5 4  Shoulder adduction      Shoulder extension      Shoulder internal rotation      Shoulder external rotation  Middle trapezius 4 4-  Lower trapezius      Elbow flexion      Elbow extension      Wrist flexion      Wrist extension       Wrist ulnar deviation      Wrist radial deviation      Wrist pronation      Wrist supination      Grip strength       (Blank rows = not tested)   SPECIAL TESTS:  (-) cervical compression/distraction    FUNCTIONAL TESTS:  5 x STS: 13 seconds   Squat: poor hip hinge, trunk flexion    GAIT: Distance walked: 10 ft  Assistive device utilized: None Level of assistance: Complete Independence Comments: WNL   [facility] Adult PT Treatment:                                                DATE: 04/29/23 Therapeutic Exercise: Nustep level 5 x 6 mins Rows BlueTB 2x10 Shoulder extension GTB 2x10 Seated horizontal abduction GTB 2x10 Seated diagonals GTB x10 BIL Bridges 2x10 SLR with TA activation x10 BIL SKTC 10" hold x5 BIL Supine hamstring stretch with strap 2x30" BIL LTR x10 BIL Sidelying open books x10 BIL Sidelying hip abduction x10 BIL   [facility] Adult PT Treatment:                                                DATE: 04/17/23 Aquatic therapy at [facility]- [address] - therapeutic pool temp approximately 92 degrees. Pt enters building ambulating independently. Treatment took place in water 3.8 to  4 ft 8 in.feet deep depending upon activity.  Pt entered and exited the pool via stair and handrails independently. Patient entered water for aquatic therapy for first time and was introduced to principles and therapeutic effects of water as they ambulated and acclimated to pool. Therapeutic Exercise: Aquatic Exercise: Walking forward/backwards/side stepping Runners stretch on bottom step x30" BIL Hamstring stretch on bottom step x30" BIL Figure 4 squat stretch, BIL UE support x30" BIL Standing thoracic rotation with noodle x1' Cat/cows with pool noodle x1' Shoulder flexion/extension, horizontal abd/add, abd/adduction with pink bells x20 each Standing with UE support edge of pool: Hip abd/add x20 BIL Heel raise 2x20 Hip ext/flex with knee straight x 20 BIL Hip Circles CC/CCW x10  each BIL Marching hip flexion to knee extension x20 BIL Hamstring curl x20 BIL Squats 2x20   Pt requires the buoyancy of water for active assisted exercises with buoyancy supported for strengthening and AROM exercises. Hydrostatic pressure also supports joints by unweighting joint load by at least 50 % in 3-4 feet depth water. 80% in chest to neck deep water. Water will provide assistance with movement using the current and laminar flow while the buoyancy reduces weight bearing. Pt requires the viscosity of the water for resistance with strengthening exercises.   [facility] Adult PT Treatment:                                                DATE: 04/15/23 Therapeutic Exercise: Lora Paula  level 5 x 5 mins Rows GTB 2x10 Shoulder extension GTB 2x10 Seated horizontal abduction RTB 2x10 Bridges 2x10 SLR with TA activation x10 BIL SKTC 10" hold x10 BIL LTR x10 BIL Sidelying open books x10 BIL Sidelying hip abduction x10 BIL Seated blue pball roll outs fwd/lat x10 each        PATIENT EDUCATION:  Education details: see treatment Person educated: Patient Education method: Explanation, Demonstration, Tactile cues, Verbal cues, and Handouts Education comprehension: verbalized understanding, returned demonstration, verbal cues required, tactile cues required, and needs further education   HOME EXERCISE PROGRAM: Access Code: [email or web address] URL: [email or web address] Date: 04/08/2023 Prepared by: Letitia Libra   Exercises - Supine Lower Trunk Rotation  - 1 x daily - 7 x weekly - 2 sets - 10 reps - Supine Single Knee to Chest Stretch  - 1 x daily - 7 x weekly - 2 sets - 10 reps - 5 sec  hold - Seated Scapular Retraction  - 1 x daily - 7 x weekly - 2 sets - 10 reps - Seated Cervical Retraction  - 1 x daily - 7 x weekly - 2 sets - 10 reps   ASSESSMENT:   CLINICAL IMPRESSION: Patient presents to PT reporting increased pain in her thoracic spine today, stating she recently returned from a trip  to [place] and did work today as well. Session today continued to focus on periscapular, core, and proximal hip strengthening as well as stretching within patients pain tolerance. Patient was able to tolerate all prescribed exercises with no adverse effects. Patient continues to benefit from skilled PT services and should be progressed as able to improve functional independence.     OBJECTIVE IMPAIRMENTS: decreased activity tolerance, decreased endurance, decreased knowledge of condition, difficulty walking, decreased ROM, decreased strength, hypomobility, increased fascial restrictions, impaired flexibility, improper body mechanics, postural dysfunction, and pain.    ACTIVITY LIMITATIONS: carrying, lifting, bending, sitting, standing, squatting, transfers, and locomotion level   PARTICIPATION LIMITATIONS: meal prep, cleaning, laundry, driving, shopping, community activity, and occupation   PERSONAL FACTORS: Age, Fitness, Past/current experiences, Profession, Time since onset of injury/illness/exacerbation, and 3+ comorbidities: see PMH above  are also affecting patient's functional outcome.    REHAB POTENTIAL: Fair chronicity of injury; co-morbidities   CLINICAL DECISION MAKING: Evolving/moderate complexity   EVALUATION COMPLEXITY: Moderate     GOALS: Goals reviewed with patient? Yes   SHORT TERM GOALS: Target date: 05/07/2023      Patient will be independent and compliant with initial HEP.    Baseline: see above Goal status: INITIAL   2.  Patient will demonstrate at least 60 degrees of Lt cervical rotation AROM to improve ability to complete head turns while driving.  Baseline: see above  Goal status: INITIAL   3.  Patient will improve trunk flexion by 25% without pain to improve ability to complete bending activities.  Baseline: see above  Goal status: INITIAL     LONG TERM GOALS: Target date: 06/06/23   Patient will score </=30% on the Modified Oswestry (MCID is 12%) to  signify clinically meaningful improvement in functional abilities.  Baseline: see above Goal status: INITIAL   2.  Patient will score </=25% on the NDI to signify clinically meaningful improvement in functional abilities.    Baseline: see above Goal status: INITIAL   3.  Patient will self-report ability to sit for at least 30 minutes to improve her tolerance to driving.  Baseline: 10-15 minutes  Goal status: INITIAL   4.  Patient will be  independent with advanced home program to assist in management of her chronic condition.  Baseline: see above Goal status: INITIAL   5.  Patient will demonstrate proper squat mechanics to reduce stress on her back with bending/lifting activity.  Baseline: see above  Goal status: INITIAL     PLAN:   PT FREQUENCY: 2x/week   PT DURATION: 8 weeks   PLANNED INTERVENTIONS: Therapeutic exercises, Therapeutic activity, Neuromuscular re-education, Balance training, Gait training, Patient/Family education, Self Care, Aquatic Therapy, Dry Needling, Cryotherapy, Moist heat, Manual therapy, and Re-evaluation.   PLAN FOR NEXT SESSION: review and progress HEP prn; lumbar mobility, core/hip strengthening, posture strengthening, cervical mobility    Berta Minor, PTA 04/29/2023, 4:55 PM

## 2023-05-05 ENCOUNTER — Ambulatory Visit: Payer: Medicaid Other

## 2023-05-05 DIAGNOSIS — M5459 Other low back pain: Secondary | ICD-10-CM | POA: Diagnosis not present

## 2023-05-05 DIAGNOSIS — M797 Fibromyalgia: Secondary | ICD-10-CM | POA: Diagnosis not present

## 2023-05-05 DIAGNOSIS — M6281 Muscle weakness (generalized): Secondary | ICD-10-CM

## 2023-05-05 DIAGNOSIS — M542 Cervicalgia: Secondary | ICD-10-CM | POA: Diagnosis not present

## 2023-05-05 NOTE — Therapy (Signed)
OUTPATIENT PHYSICAL THERAPY TREATMENT NOTE   Patient Name: Virginia Bradley MRN: [id number] DOB:[birth year], 37 y.o., female Today's Date: 05/05/2023  PCP: Renaye Rakers, MD  REFERRING PROVIDER: Pollyann Savoy, MD   END OF SESSION:   PT End of Session - 05/05/23 1525     Visit Number 5    Number of Visits 17    Date for PT Re-Evaluation 06/06/23    Authorization Type [employer]    Authorization Time Period 10 visits 04/13/23-06/12/23    Authorization - Visit Number 4    Authorization - Number of Visits 10    PT Start Time 1530    PT Stop Time 1610    PT Time Calculation (min) 40 min    Activity Tolerance Patient tolerated treatment well    Behavior During Therapy [facility] for tasks assessed/performed              Past Medical History:  Diagnosis Date   Anxiety    Complication of anesthesia    Depression    Family history of adverse reaction to anesthesia    GERD (gastroesophageal reflux disease)    History of COVID-19    per pt mild symptoms that resolved 03/ 2022  and 09/ 2022   Ovarian cyst, left    Seizure disorder (HCC)    (03-03-2022   pt stated last seizure seizure approxl 2020/ 2021, none since , no medications) seen 3 different neurologist documented in epic, first seen by dr Merrily Brittle 03-24-2016;  dr Lamount Cranker 06-01-2019;   dr e. feraru ([facility]) in care everywhere [place] 10-05-2019;;  febrile seizures as infant then started 2001 as teen;   hx negative EEG's in epic   Past Surgical History:  Procedure Laterality Date   APPENDECTOMY  2002   LAPAROSCOPIC OOPHORECTOMY Right 2007   per pt  thinks it was right ovary removed   ROBOTIC ASSISTED LAPAROSCOPIC OVARIAN CYSTECTOMY Left 03/06/2022   Procedure: XI ROBOTIC ASSISTED LAPAROSCOPIC OVARIAN CYSTECTOMY;  Surgeon: Toy Baker, DO;  Location:  SURGERY C[facility];  Service: Gynecology;  Laterality: Left;   Patient Active Problem List   Diagnosis Date Noted   Recurrent major depressive disorder, in  partial remission (HCC) 05/19/2019   Localization-related idiopathic epilepsy and epileptic syndromes with seizures of localized onset, not intractable, without status epilepticus (HCC) 07/20/2018   Seizure disorder (HCC) 03/24/2016   Migraine 03/24/2016    REFERRING DIAG:  M54.50,G89.29 (ICD-10-CM) - Chronic midline low back pain without sciatica  M62.838 (ICD-10-CM) - Trapezius muscle spasm  M79.7 (ICD-10-CM) - Fibromyalgia    THERAPY DIAG:  Other low back pain  Muscle weakness (generalized)  Cervicalgia  Fibromyalgia  Rationale for Evaluation and Treatment Rehabilitation  PERTINENT HISTORY: Anxiety Depression  Seizure disorder (reports no recent seizures)   PRECAUTIONS: None  SUBJECTIVE:  SUBJECTIVE STATEMENT: Patient reports improved lower back pain today, stating she took Motrin this morning before work.    PAIN:  Are you having pain? Yes: NPRS scale: 3/10 Pain location: low back Pain description: spasm,sore,tight Aggravating factors: laying on stomach, sitting Relieving factors: nothing   OBJECTIVE: (objective measures completed at initial evaluation unless otherwise dated)   DIAGNOSTIC FINDINGS:  Lumbar X-ray: No significant disc space narrowing was noted.  Facet joint narrowing was  noted.  No SI joint sclerosis was noted.   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    Cervical X-ray: C5-C6 and C6-C7 narrowing with anterior osteophytes was noted.  Mild facet  joint narrowing was noted.   Impression: These findings are consistent with degenerative disc disease  of the cervical spine.    PATIENT SURVEYS:  Modified Oswestry 21/50; 42% disability  NDI 19/50; 38% disability   SCREENING FOR RED FLAGS: Bowel or bladder  incontinence: No Spinal tumors: No Cauda equina syndrome: No Compression fracture: No Abdominal aneurysm: No   COGNITION: Overall cognitive status: Within functional limits for tasks assessed                          SENSATION: Not tested   MUSCLE LENGTH: Hamstrings: mild tightness bilaterally    POSTURE: rounded shoulders, forward head, and decreased lumbar lordosis   PALPATION: TTP CERVICAL ROM:    Active ROM A/PROM (deg) eval  Flexion 60  Extension 59  Right lateral flexion 35  Left lateral flexion 30 pn  Right rotation 75  Left rotation 51pn   (Blank rows = not tested)   LUMBAR ROM:    AROM eval  Flexion 50% limited pn  Extension WNL  Right lateral flexion WNL  Left lateral flexion WNL  Right rotation WNL  Left rotation WNL   (Blank rows = not tested)   LOWER EXTREMITY ROM:      Active  Right eval Left eval  Hip flexion      Hip extension      Hip abduction      Hip adduction      Hip internal rotation      Hip external rotation      Knee flexion      Knee extension      Ankle dorsiflexion      Ankle plantarflexion      Ankle inversion      Ankle eversion       (Blank rows = not tested)   LOWER EXTREMITY MMT:     MMT Right eval Left eval  Hip flexion 5 4  Hip extension 4 4-  Hip abduction 4+ 4-  Hip adduction      Hip internal rotation      Hip external rotation      Knee flexion      Knee extension      Ankle dorsiflexion      Ankle plantarflexion      Ankle inversion      Ankle eversion       (Blank rows = not tested) UPPER EXTREMITY MMT:   MMT Right eval Left eval  Shoulder flexion 4+ 4  Shoulder extension      Shoulder abduction 5 4  Shoulder adduction      Shoulder extension      Shoulder internal rotation      Shoulder external rotation      Middle trapezius 4  4-  Lower trapezius      Elbow flexion      Elbow extension      Wrist flexion      Wrist extension      Wrist ulnar deviation      Wrist radial  deviation      Wrist pronation      Wrist supination      Grip strength       (Blank rows = not tested)   SPECIAL TESTS:  (-) cervical compression/distraction    FUNCTIONAL TESTS:  5 x STS: 13 seconds   Squat: poor hip hinge, trunk flexion    GAIT: Distance walked: 10 ft  Assistive device utilized: None Level of assistance: Complete Independence Comments: WNL   [facility] Adult PT Treatment:                                                DATE: 05/05/23 Therapeutic Exercise: Nustep level 6 x 6 mins Standing hip abduction/extension x10 BIL Rows BlueTB 2x10 Shoulder extension GTB 2x10 Seated horizontal abduction GTB 2x10 Seated diagonals GTB x10 BIL DKTC 2x15" Bridges 2x10 SLR with TA activation x10 BIL SKTC 10" hold x5 BIL Supine hamstring stretch with strap 2x30" BIL LTR x10 BIL Sidelying open books x10 BIL Seated pball roll outs fwd/lat x10 each Supine QL stretch x30" BIL   [facility] Adult PT Treatment:                                                DATE: 04/29/23 Therapeutic Exercise: Nustep level 5 x 6 mins Rows BlueTB 2x10 Shoulder extension GTB 2x10 Seated horizontal abduction GTB 2x10 Seated diagonals GTB x10 BIL Bridges 2x10 SLR with TA activation x10 BIL SKTC 10" hold x5 BIL Supine hamstring stretch with strap 2x30" BIL LTR x10 BIL Sidelying open books x10 BIL Sidelying hip abduction x10 BIL   [facility] Adult PT Treatment:                                                DATE: 04/17/23 Aquatic therapy at [facility]- [address] - therapeutic pool temp approximately 92 degrees. Pt enters building ambulating independently. Treatment took place in water 3.8 to  4 ft 8 in.feet deep depending upon activity.  Pt entered and exited the pool via stair and handrails independently. Patient entered water for aquatic therapy for first time and was introduced to principles and therapeutic effects of water as they ambulated and acclimated to pool. Therapeutic Exercise: Aquatic  Exercise: Walking forward/backwards/side stepping Runners stretch on bottom step x30" BIL Hamstring stretch on bottom step x30" BIL Figure 4 squat stretch, BIL UE support x30" BIL Standing thoracic rotation with noodle x1' Cat/cows with pool noodle x1' Shoulder flexion/extension, horizontal abd/add, abd/adduction with pink bells x20 each Standing with UE support edge of pool: Hip abd/add x20 BIL Heel raise 2x20 Hip ext/flex with knee straight x 20 BIL Hip Circles CC/CCW x10 each BIL Marching hip flexion to knee extension x20 BIL Hamstring curl x20 BIL Squats 2x20   Pt requires the buoyancy of water for active  assisted exercises with buoyancy supported for strengthening and AROM exercises. Hydrostatic pressure also supports joints by unweighting joint load by at least 50 % in 3-4 feet depth water. 80% in chest to neck deep water. Water will provide assistance with movement using the current and laminar flow while the buoyancy reduces weight bearing. Pt requires the viscosity of the water for resistance with strengthening exercises.        PATIENT EDUCATION:  Education details: see treatment Person educated: Patient Education method: Explanation, Demonstration, Tactile cues, Verbal cues, and Handouts Education comprehension: verbalized understanding, returned demonstration, verbal cues required, tactile cues required, and needs further education   HOME EXERCISE PROGRAM: Access Code: [email or web address] URL: [email or web address] Date: 04/08/2023 Prepared by: Letitia Libra   Exercises - Supine Lower Trunk Rotation  - 1 x daily - 7 x weekly - 2 sets - 10 reps - Supine Single Knee to Chest Stretch  - 1 x daily - 7 x weekly - 2 sets - 10 reps - 5 sec  hold - Seated Scapular Retraction  - 1 x daily - 7 x weekly - 2 sets - 10 reps - Seated Cervical Retraction  - 1 x daily - 7 x weekly - 2 sets - 10 reps   ASSESSMENT:   CLINICAL IMPRESSION: Patient presents to PT reporting  lessened pain today in her back and reports HEP compliance. Session today continued to focus on proximal hip, core, and periscapular strengthening. Increased difficulty today with more standing exercises to good effect, increase in fatigue but not in pain. Patient was able to tolerate all prescribed exercises with no adverse effects. Patient continues to benefit from skilled PT services and should be progressed as able to improve functional independence.     OBJECTIVE IMPAIRMENTS: decreased activity tolerance, decreased endurance, decreased knowledge of condition, difficulty walking, decreased ROM, decreased strength, hypomobility, increased fascial restrictions, impaired flexibility, improper body mechanics, postural dysfunction, and pain.    ACTIVITY LIMITATIONS: carrying, lifting, bending, sitting, standing, squatting, transfers, and locomotion level   PARTICIPATION LIMITATIONS: meal prep, cleaning, laundry, driving, shopping, community activity, and occupation   PERSONAL FACTORS: Age, Fitness, Past/current experiences, Profession, Time since onset of injury/illness/exacerbation, and 3+ comorbidities: see PMH above  are also affecting patient's functional outcome.    REHAB POTENTIAL: Fair chronicity of injury; co-morbidities   CLINICAL DECISION MAKING: Evolving/moderate complexity   EVALUATION COMPLEXITY: Moderate     GOALS: Goals reviewed with patient? Yes   SHORT TERM GOALS: Target date: 05/07/2023      Patient will be independent and compliant with initial HEP.    Baseline: see above Goal status: INITIAL   2.  Patient will demonstrate at least 60 degrees of Lt cervical rotation AROM to improve ability to complete head turns while driving.  Baseline: see above  Goal status: INITIAL   3.  Patient will improve trunk flexion by 25% without pain to improve ability to complete bending activities.  Baseline: see above  Goal status: INITIAL     LONG TERM GOALS: Target date:  06/06/23   Patient will score </=30% on the Modified Oswestry (MCID is 12%) to signify clinically meaningful improvement in functional abilities.  Baseline: see above Goal status: INITIAL   2.  Patient will score </=25% on the NDI to signify clinically meaningful improvement in functional abilities.    Baseline: see above Goal status: INITIAL   3.  Patient will self-report ability to sit for at least 30 minutes to improve her tolerance to driving.  Baseline: 10-15  minutes  Goal status: INITIAL   4.  Patient will be independent with advanced home program to assist in management of her chronic condition.  Baseline: see above Goal status: INITIAL   5.  Patient will demonstrate proper squat mechanics to reduce stress on her back with bending/lifting activity.  Baseline: see above  Goal status: INITIAL     PLAN:   PT FREQUENCY: 2x/week   PT DURATION: 8 weeks   PLANNED INTERVENTIONS: Therapeutic exercises, Therapeutic activity, Neuromuscular re-education, Balance training, Gait training, Patient/Family education, Self Care, Aquatic Therapy, Dry Needling, Cryotherapy, Moist heat, Manual therapy, and Re-evaluation.   PLAN FOR NEXT SESSION: review and progress HEP prn; lumbar mobility, core/hip strengthening, posture strengthening, cervical mobility    Berta Minor, PTA 05/05/2023, 4:10 PM

## 2023-05-07 DIAGNOSIS — Z1159 Encounter for screening for other viral diseases: Secondary | ICD-10-CM | POA: Diagnosis not present

## 2023-05-07 DIAGNOSIS — Z7251 High risk heterosexual behavior: Secondary | ICD-10-CM | POA: Diagnosis not present

## 2023-05-07 DIAGNOSIS — Z113 Encounter for screening for infections with a predominantly sexual mode of transmission: Secondary | ICD-10-CM | POA: Diagnosis not present

## 2023-05-07 DIAGNOSIS — Z114 Encounter for screening for human immunodeficiency virus [HIV]: Secondary | ICD-10-CM | POA: Diagnosis not present

## 2023-05-07 DIAGNOSIS — F3341 Major depressive disorder, recurrent, in partial remission: Secondary | ICD-10-CM | POA: Diagnosis not present

## 2023-05-07 DIAGNOSIS — F411 Generalized anxiety disorder: Secondary | ICD-10-CM | POA: Diagnosis not present

## 2023-05-07 DIAGNOSIS — G4710 Hypersomnia, unspecified: Secondary | ICD-10-CM | POA: Diagnosis not present

## 2023-05-08 ENCOUNTER — Ambulatory Visit: Payer: Medicaid Other

## 2023-05-08 DIAGNOSIS — M5459 Other low back pain: Secondary | ICD-10-CM

## 2023-05-08 DIAGNOSIS — M542 Cervicalgia: Secondary | ICD-10-CM

## 2023-05-08 DIAGNOSIS — M797 Fibromyalgia: Secondary | ICD-10-CM | POA: Diagnosis not present

## 2023-05-08 DIAGNOSIS — M6281 Muscle weakness (generalized): Secondary | ICD-10-CM

## 2023-05-08 NOTE — Therapy (Signed)
OUTPATIENT PHYSICAL THERAPY TREATMENT NOTE   Patient Name: Virginia Bradley MRN: [id number] DOB:[birth year], 37 y.o., female Today's Date: 05/08/2023  PCP: Renaye Rakers, MD  REFERRING PROVIDER: Pollyann Savoy, MD   END OF SESSION:      Past Medical History:  Diagnosis Date   Anxiety    Complication of anesthesia    Depression    Family history of adverse reaction to anesthesia    GERD (gastroesophageal reflux disease)    History of COVID-19    per pt mild symptoms that resolved 03/ 2022  and 09/ 2022   Ovarian cyst, left    Seizure disorder (HCC)    (03-03-2022   pt stated last seizure seizure approxl 2020/ 2021, none since , no medications) seen 3 different neurologist documented in epic, first seen by dr Merrily Brittle 03-24-2016;  dr Lamount Cranker 06-01-2019;   dr e. feraru ([facility]) in care everywhere [place] 10-05-2019;;  febrile seizures as infant then started 2001 as teen;   hx negative EEG's in epic   Past Surgical History:  Procedure Laterality Date   APPENDECTOMY  2002   LAPAROSCOPIC OOPHORECTOMY Right 2007   per pt  thinks it was right ovary removed   ROBOTIC ASSISTED LAPAROSCOPIC OVARIAN CYSTECTOMY Left 03/06/2022   Procedure: XI ROBOTIC ASSISTED LAPAROSCOPIC OVARIAN CYSTECTOMY;  Surgeon: Toy Baker, DO;  Location: [facility];  Service: Gynecology;  Laterality: Left;   Patient Active Problem List   Diagnosis Date Noted   Recurrent major depressive disorder, in partial remission (HCC) 05/19/2019   Localization-related idiopathic epilepsy and epileptic syndromes with seizures of localized onset, not intractable, without status epilepticus (HCC) 07/20/2018   Seizure disorder (HCC) 03/24/2016   Migraine 03/24/2016    REFERRING DIAG:  M54.50,G89.29 (ICD-10-CM) - Chronic midline low back pain without sciatica  M62.838 (ICD-10-CM) - Trapezius muscle spasm  M79.7 (ICD-10-CM) - Fibromyalgia    THERAPY DIAG:  No diagnosis found.  Rationale for  Evaluation and Treatment Rehabilitation  PERTINENT HISTORY: Anxiety Depression  Seizure disorder (reports no recent seizures)   PRECAUTIONS: None  SUBJECTIVE:                                                                                                                                                                                      SUBJECTIVE STATEMENT: Patient reports continued back pain, but that it is lower overall.   PAIN:  Are you having pain? Yes: NPRS scale: 3/10 Pain location: low back Pain description: spasm,sore,tight Aggravating factors: laying on stomach, sitting Relieving factors: nothing   OBJECTIVE: (objective measures completed at initial evaluation unless otherwise dated)   DIAGNOSTIC FINDINGS:  Lumbar X-ray: No  significant disc space narrowing was noted.  Facet joint narrowing was  noted.  No SI joint sclerosis was noted.   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    Cervical X-ray: C5-C6 and C6-C7 narrowing with anterior osteophytes was noted.  Mild facet  joint narrowing was noted.   Impression: These findings are consistent with degenerative disc disease  of the cervical spine.    PATIENT SURVEYS:  Modified Oswestry 21/50; 42% disability  NDI 19/50; 38% disability   SCREENING FOR RED FLAGS: Bowel or bladder incontinence: No Spinal tumors: No Cauda equina syndrome: No Compression fracture: No Abdominal aneurysm: No   COGNITION: Overall cognitive status: Within functional limits for tasks assessed                          SENSATION: Not tested   MUSCLE LENGTH: Hamstrings: mild tightness bilaterally    POSTURE: rounded shoulders, forward head, and decreased lumbar lordosis   PALPATION: TTP CERVICAL ROM:    Active ROM A/PROM (deg) eval  Flexion 60  Extension 59  Right lateral flexion 35  Left lateral flexion 30 pn  Right rotation 75  Left rotation 51pn   (Blank rows = not  tested)   LUMBAR ROM:    AROM eval  Flexion 50% limited pn  Extension WNL  Right lateral flexion WNL  Left lateral flexion WNL  Right rotation WNL  Left rotation WNL   (Blank rows = not tested)   LOWER EXTREMITY ROM:      Active  Right eval Left eval  Hip flexion      Hip extension      Hip abduction      Hip adduction      Hip internal rotation      Hip external rotation      Knee flexion      Knee extension      Ankle dorsiflexion      Ankle plantarflexion      Ankle inversion      Ankle eversion       (Blank rows = not tested)   LOWER EXTREMITY MMT:     MMT Right eval Left eval  Hip flexion 5 4  Hip extension 4 4-  Hip abduction 4+ 4-  Hip adduction      Hip internal rotation      Hip external rotation      Knee flexion      Knee extension      Ankle dorsiflexion      Ankle plantarflexion      Ankle inversion      Ankle eversion       (Blank rows = not tested) UPPER EXTREMITY MMT:   MMT Right eval Left eval  Shoulder flexion 4+ 4  Shoulder extension      Shoulder abduction 5 4  Shoulder adduction      Shoulder extension      Shoulder internal rotation      Shoulder external rotation      Middle trapezius 4 4-  Lower trapezius      Elbow flexion      Elbow extension      Wrist flexion      Wrist extension      Wrist ulnar deviation      Wrist radial deviation      Wrist pronation      Wrist supination  Grip strength       (Blank rows = not tested)   SPECIAL TESTS:  (-) cervical compression/distraction    FUNCTIONAL TESTS:  5 x STS: 13 seconds   Squat: poor hip hinge, trunk flexion    GAIT: Distance walked: 10 ft  Assistive device utilized: None Level of assistance: Complete Independence Comments: WNL   [facility] Adult PT Treatment:                                                DATE: 05/08/23 Aquatic therapy at [facility]- [address] - therapeutic pool temp approximately 92 degrees. Pt enters building ambulating  independently. Treatment took place in water 3.8 to  4 ft 8 in.feet deep depending upon activity.  Pt entered and exited the pool via stair and handrails independently.  Therapeutic Exercise: Aquatic Exercise: Walking forward/backwards/side stepping holding rainbow DB by sides Side stepping with shoulder abd/add rainbow DB x2 laps Single leg RDL with noodle x10 BIL Green noodle stomp x15 BIL Straddling yellow noodle with rainbow DB bicycle kick x3' Standing quad stretch x30" BIL Runners stretch on bottom step x30" BIL Hamstring stretch on bottom step x30" BIL Figure 4 squat stretch, BIL UE support x30" BIL Standing thoracic rotation with noodle x1' Cat/cows with pool noodle x1' Shoulder flexion/extension, horizontal abd/add, abd/adduction with pink bells x10 each Standing with UE support edge of pool: Hip abd/add x20 BIL Heel raise x20 Squats 2x20   Pt requires the buoyancy of water for active assisted exercises with buoyancy supported for strengthening and AROM exercises. Hydrostatic pressure also supports joints by unweighting joint load by at least 50 % in 3-4 feet depth water. 80% in chest to neck deep water. Water will provide assistance with movement using the current and laminar flow while the buoyancy reduces weight bearing. Pt requires the viscosity of the water for resistance with strengthening exercises.  [facility] Adult PT Treatment:                                                DATE: 05/05/23 Therapeutic Exercise: Nustep level 6 x 6 mins Standing hip abduction/extension x10 BIL Rows BlueTB 2x10 Shoulder extension GTB 2x10 Seated horizontal abduction GTB 2x10 Seated diagonals GTB x10 BIL DKTC 2x15" Bridges 2x10 SLR with TA activation x10 BIL SKTC 10" hold x5 BIL Supine hamstring stretch with strap 2x30" BIL LTR x10 BIL Sidelying open books x10 BIL Seated pball roll outs fwd/lat x10 each Supine QL stretch x30" BIL   [facility] Adult PT Treatment:                                                 DATE: 04/29/23 Therapeutic Exercise: Nustep level 5 x 6 mins Rows BlueTB 2x10 Shoulder extension GTB 2x10 Seated horizontal abduction GTB 2x10 Seated diagonals GTB x10 BIL Bridges 2x10 SLR with TA activation x10 BIL SKTC 10" hold x5 BIL Supine hamstring stretch with strap 2x30" BIL LTR x10 BIL Sidelying open books x10 BIL Sidelying hip abduction x10 BIL         PATIENT EDUCATION:  Education details:  see treatment Person educated: Patient Education method: Explanation, Demonstration, Tactile cues, Verbal cues, and Handouts Education comprehension: verbalized understanding, returned demonstration, verbal cues required, tactile cues required, and needs further education   HOME EXERCISE PROGRAM: Access Code: [email or web address] URL: [email or web address] Date: 04/08/2023 Prepared by: Letitia Libra   Exercises - Supine Lower Trunk Rotation  - 1 x daily - 7 x weekly - 2 sets - 10 reps - Supine Single Knee to Chest Stretch  - 1 x daily - 7 x weekly - 2 sets - 10 reps - 5 sec  hold - Seated Scapular Retraction  - 1 x daily - 7 x weekly - 2 sets - 10 reps - Seated Cervical Retraction  - 1 x daily - 7 x weekly - 2 sets - 10 reps   ASSESSMENT:   CLINICAL IMPRESSION: Patient presents to aquatic PT session reporting continued improvements in her lower back pain an states she has been compliant with her HEP. Session today focused on LE strengthening, general conditioning, and consistent movement for 45 minutes in the aquatic environment for use of buoyancy to offload joints and the viscosity of water as resistance during therapeutic exercise. Patient was able to tolerate all prescribed exercises in the aquatic environment with no adverse effects and reports. Patient continues to benefit from skilled PT services on land and aquatic based and should be progressed as able to improve functional independence.     OBJECTIVE IMPAIRMENTS: decreased activity tolerance,  decreased endurance, decreased knowledge of condition, difficulty walking, decreased ROM, decreased strength, hypomobility, increased fascial restrictions, impaired flexibility, improper body mechanics, postural dysfunction, and pain.    ACTIVITY LIMITATIONS: carrying, lifting, bending, sitting, standing, squatting, transfers, and locomotion level   PARTICIPATION LIMITATIONS: meal prep, cleaning, laundry, driving, shopping, community activity, and occupation   PERSONAL FACTORS: Age, Fitness, Past/current experiences, Profession, Time since onset of injury/illness/exacerbation, and 3+ comorbidities: see PMH above  are also affecting patient's functional outcome.    REHAB POTENTIAL: Fair chronicity of injury; co-morbidities   CLINICAL DECISION MAKING: Evolving/moderate complexity   EVALUATION COMPLEXITY: Moderate     GOALS: Goals reviewed with patient? Yes   SHORT TERM GOALS: Target date: 05/07/2023      Patient will be independent and compliant with initial HEP.    Baseline: see above Goal status: MET Pt reports adherence 05/08/23   2.  Patient will demonstrate at least 60 degrees of Lt cervical rotation AROM to improve ability to complete head turns while driving.  Baseline: see above  Goal status: ongoing   3.  Patient will improve trunk flexion by 25% without pain to improve ability to complete bending activities.  Baseline: see above  Goal status: ongoing     LONG TERM GOALS: Target date: 06/06/23   Patient will score </=30% on the Modified Oswestry (MCID is 12%) to signify clinically meaningful improvement in functional abilities.  Baseline: see above Goal status: INITIAL   2.  Patient will score </=25% on the NDI to signify clinically meaningful improvement in functional abilities.    Baseline: see above Goal status: INITIAL   3.  Patient will self-report ability to sit for at least 30 minutes to improve her tolerance to driving.  Baseline: 10-15 minutes  Goal  status: INITIAL   4.  Patient will be independent with advanced home program to assist in management of her chronic condition.  Baseline: see above Goal status: INITIAL   5.  Patient will demonstrate proper squat mechanics to reduce stress on her back with  bending/lifting activity.  Baseline: see above  Goal status: INITIAL     PLAN:   PT FREQUENCY: 2x/week   PT DURATION: 8 weeks   PLANNED INTERVENTIONS: Therapeutic exercises, Therapeutic activity, Neuromuscular re-education, Balance training, Gait training, Patient/Family education, Self Care, Aquatic Therapy, Dry Needling, Cryotherapy, Moist heat, Manual therapy, and Re-evaluation.   PLAN FOR NEXT SESSION: review and progress HEP prn; lumbar mobility, core/hip strengthening, posture strengthening, cervical mobility    Berta Minor, PTA 05/08/2023, 11:44 AM

## 2023-05-12 ENCOUNTER — Ambulatory Visit: Payer: Medicaid Other

## 2023-05-12 DIAGNOSIS — M797 Fibromyalgia: Secondary | ICD-10-CM

## 2023-05-12 DIAGNOSIS — M5459 Other low back pain: Secondary | ICD-10-CM

## 2023-05-12 DIAGNOSIS — M542 Cervicalgia: Secondary | ICD-10-CM | POA: Diagnosis not present

## 2023-05-12 DIAGNOSIS — M6281 Muscle weakness (generalized): Secondary | ICD-10-CM | POA: Diagnosis not present

## 2023-05-12 NOTE — Therapy (Signed)
OUTPATIENT PHYSICAL THERAPY TREATMENT NOTE   Patient Name: Virginia Bradley MRN: [id number] DOB:02-Nov-1986, 37 y.o., female Today's Date: 05/12/2023  PCP: Renaye Rakers, MD  REFERRING PROVIDER: Pollyann Savoy, MD   END OF SESSION:   PT End of Session - 05/12/23 1523     Visit Number 7    Number of Visits 17    Date for PT Re-Evaluation 06/06/23    Authorization Type [employer]    Authorization Time Period 10 visits 04/13/23-06/12/23    Authorization - Visit Number 6    Authorization - Number of Visits 10    PT Start Time 1530    PT Stop Time 1610    PT Time Calculation (min) 40 min    Activity Tolerance Patient tolerated treatment well    Behavior During Therapy [facility] for tasks assessed/performed             Past Medical History:  Diagnosis Date   Anxiety    Complication of anesthesia    Depression    Family history of adverse reaction to anesthesia    GERD (gastroesophageal reflux disease)    History of COVID-19    per pt mild symptoms that resolved 03/ 2022  and 09/ 2022   Ovarian cyst, left    Seizure disorder (HCC)    (03-03-2022   pt stated last seizure seizure approxl 2020/ 2021, none since , no medications) seen 3 different neurologist documented in epic, first seen by dr Merrily Brittle 03-24-2016;  dr Lamount Cranker 06-01-2019;   dr e. feraru ([facility]) in care everywhere [place] 10-05-2019;;  febrile seizures as infant then started 2001 as teen;   hx negative EEG's in epic   Past Surgical History:  Procedure Laterality Date   APPENDECTOMY  2002   LAPAROSCOPIC OOPHORECTOMY Right 2007   per pt  thinks it was right ovary removed   ROBOTIC ASSISTED LAPAROSCOPIC OVARIAN CYSTECTOMY Left 03/06/2022   Procedure: XI ROBOTIC ASSISTED LAPAROSCOPIC OVARIAN CYSTECTOMY;  Surgeon: Toy Baker, DO;  Location: [facility];  Service: Gynecology;  Laterality: Left;   Patient Active Problem List   Diagnosis Date Noted   Recurrent major depressive disorder, in  partial remission (HCC) 05/19/2019   Localization-related idiopathic epilepsy and epileptic syndromes with seizures of localized onset, not intractable, without status epilepticus (HCC) 07/20/2018   Seizure disorder (HCC) 03/24/2016   Migraine 03/24/2016    REFERRING DIAG:  M54.50,G89.29 (ICD-10-CM) - Chronic midline low back pain without sciatica  M62.838 (ICD-10-CM) - Trapezius muscle spasm  M79.7 (ICD-10-CM) - Fibromyalgia    THERAPY DIAG:  Other low back pain  Muscle weakness (generalized)  Cervicalgia  Fibromyalgia  Rationale for Evaluation and Treatment Rehabilitation  PERTINENT HISTORY: Anxiety Depression  Seizure disorder (reports no recent seizures)   PRECAUTIONS: None  SUBJECTIVE:  SUBJECTIVE STATEMENT: Patient reports her lower back pain is a bit better today, states that BIL shoulders are sore/achy.    PAIN:  Are you having pain? Yes: NPRS scale: 3/10 (6/10 BIL shoulders today) Pain location: low back Pain description: spasm,sore,tight Aggravating factors: laying on stomach, sitting Relieving factors: nothing   OBJECTIVE: (objective measures completed at initial evaluation unless otherwise dated)   DIAGNOSTIC FINDINGS:  Lumbar X-ray: No significant disc space narrowing was noted.  Facet joint narrowing was  noted.  No SI joint sclerosis was noted.   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    Cervical X-ray: C5-C6 and C6-C7 narrowing with anterior osteophytes was noted.  Mild facet  joint narrowing was noted.   Impression: These findings are consistent with degenerative disc disease  of the cervical spine.    PATIENT SURVEYS:  Modified Oswestry 21/50; 42% disability  NDI 19/50; 38% disability   SCREENING FOR RED  FLAGS: Bowel or bladder incontinence: No Spinal tumors: No Cauda equina syndrome: No Compression fracture: No Abdominal aneurysm: No   COGNITION: Overall cognitive status: Within functional limits for tasks assessed                          SENSATION: Not tested   MUSCLE LENGTH: Hamstrings: mild tightness bilaterally    POSTURE: rounded shoulders, forward head, and decreased lumbar lordosis   PALPATION: TTP CERVICAL ROM:    Active ROM A/PROM (deg) eval  Flexion 60  Extension 59  Right lateral flexion 35  Left lateral flexion 30 pn  Right rotation 75  Left rotation 51pn   (Blank rows = not tested)   LUMBAR ROM:    AROM eval  Flexion 50% limited pn  Extension WNL  Right lateral flexion WNL  Left lateral flexion WNL  Right rotation WNL  Left rotation WNL   (Blank rows = not tested)   LOWER EXTREMITY ROM:      Active  Right eval Left eval  Hip flexion      Hip extension      Hip abduction      Hip adduction      Hip internal rotation      Hip external rotation      Knee flexion      Knee extension      Ankle dorsiflexion      Ankle plantarflexion      Ankle inversion      Ankle eversion       (Blank rows = not tested)   LOWER EXTREMITY MMT:     MMT Right eval Left eval  Hip flexion 5 4  Hip extension 4 4-  Hip abduction 4+ 4-  Hip adduction      Hip internal rotation      Hip external rotation      Knee flexion      Knee extension      Ankle dorsiflexion      Ankle plantarflexion      Ankle inversion      Ankle eversion       (Blank rows = not tested) UPPER EXTREMITY MMT:   MMT Right eval Left eval  Shoulder flexion 4+ 4  Shoulder extension      Shoulder abduction 5 4  Shoulder adduction      Shoulder extension      Shoulder internal rotation      Shoulder external rotation  Middle trapezius 4 4-  Lower trapezius      Elbow flexion      Elbow extension      Wrist flexion      Wrist extension      Wrist ulnar deviation       Wrist radial deviation      Wrist pronation      Wrist supination      Grip strength       (Blank rows = not tested)   SPECIAL TESTS:  (-) cervical compression/distraction    FUNCTIONAL TESTS:  5 x STS: 13 seconds   Squat: poor hip hinge, trunk flexion    GAIT: Distance walked: 10 ft  Assistive device utilized: None Level of assistance: Complete Independence Comments: WNL   [facility] Adult PT Treatment:                                                DATE: 05/12/23 Therapeutic Exercise: Nustep level 6 x 6 mins Standing hip abduction/extension RTB at ankles x10 BIL Rows BlueTB 2x10 Shoulder extension GTB 2x10 Seated horizontal abduction GTB 2x10 DKTC 2x15" Bridges 2x10 SLR with pilates ring push TA activation x10 BIL SKTC 10" hold x5 BIL LTR x10 BIL Seated pball roll outs fwd/lat x10 each Supine QL stretch 2x30" BIL    [facility] Adult PT Treatment:                                                DATE: 05/08/23 Aquatic therapy at [facility]- [address] - therapeutic pool temp approximately 92 degrees. Pt enters building ambulating independently. Treatment took place in water 3.8 to  4 ft 8 in.feet deep depending upon activity.  Pt entered and exited the pool via stair and handrails independently.  Therapeutic Exercise: Aquatic Exercise: Walking forward/backwards/side stepping holding rainbow DB by sides Side stepping with shoulder abd/add rainbow DB x2 laps Single leg RDL with noodle x10 BIL Green noodle stomp x15 BIL Straddling yellow noodle with rainbow DB bicycle kick x3' Standing quad stretch x30" BIL Runners stretch on bottom step x30" BIL Hamstring stretch on bottom step x30" BIL Figure 4 squat stretch, BIL UE support x30" BIL Standing thoracic rotation with noodle x1' Cat/cows with pool noodle x1' Shoulder flexion/extension, horizontal abd/add, abd/adduction with pink bells x10 each Standing with UE support edge of pool: Hip abd/add x20 BIL Heel raise  x20 Squats 2x20   Pt requires the buoyancy of water for active assisted exercises with buoyancy supported for strengthening and AROM exercises. Hydrostatic pressure also supports joints by unweighting joint load by at least 50 % in 3-4 feet depth water. 80% in chest to neck deep water. Water will provide assistance with movement using the current and laminar flow while the buoyancy reduces weight bearing. Pt requires the viscosity of the water for resistance with strengthening exercises.  [facility] Adult PT Treatment:                                                DATE: 05/05/23 Therapeutic Exercise: Nustep level 6 x 6 mins Standing hip abduction/extension  x10 BIL Rows BlueTB 2x10 Shoulder extension GTB 2x10 Seated horizontal abduction GTB 2x10 Seated diagonals GTB x10 BIL DKTC 2x15" Bridges 2x10 SLR with TA activation x10 BIL SKTC 10" hold x5 BIL Supine hamstring stretch with strap 2x30" BIL LTR x10 BIL Sidelying open books x10 BIL Seated pball roll outs fwd/lat x10 each Supine QL stretch x30" BIL         PATIENT EDUCATION:  Education details: see treatment Person educated: Patient Education method: Explanation, Demonstration, Tactile cues, Verbal cues, and Handouts Education comprehension: verbalized understanding, returned demonstration, verbal cues required, tactile cues required, and needs further education   HOME EXERCISE PROGRAM: Access Code: [email or web address] URL: [email or web address] Date: 04/08/2023 Prepared by: Letitia Libra   Exercises - Supine Lower Trunk Rotation  - 1 x daily - 7 x weekly - 2 sets - 10 reps - Supine Single Knee to Chest Stretch  - 1 x daily - 7 x weekly - 2 sets - 10 reps - 5 sec  hold - Seated Scapular Retraction  - 1 x daily - 7 x weekly - 2 sets - 10 reps - Seated Cervical Retraction  - 1 x daily - 7 x weekly - 2 sets - 10 reps   ASSESSMENT:   CLINICAL IMPRESSION: Patient presents to PT reporting lower back pain as well as BIL shoulder  pain. She states she was tired after aquatic therapy last week, but not as much as the first time. Session today continued to focus on core, proximal hip, and periscapular strengthening. Patient was able to tolerate all prescribed exercises with no adverse effects. Patient continues to benefit from skilled PT services and should be progressed as able to improve functional independence.     OBJECTIVE IMPAIRMENTS: decreased activity tolerance, decreased endurance, decreased knowledge of condition, difficulty walking, decreased ROM, decreased strength, hypomobility, increased fascial restrictions, impaired flexibility, improper body mechanics, postural dysfunction, and pain.    ACTIVITY LIMITATIONS: carrying, lifting, bending, sitting, standing, squatting, transfers, and locomotion level   PARTICIPATION LIMITATIONS: meal prep, cleaning, laundry, driving, shopping, community activity, and occupation   PERSONAL FACTORS: Age, Fitness, Past/current experiences, Profession, Time since onset of injury/illness/exacerbation, and 3+ comorbidities: see PMH above  are also affecting patient's functional outcome.    REHAB POTENTIAL: Fair chronicity of injury; co-morbidities   CLINICAL DECISION MAKING: Evolving/moderate complexity   EVALUATION COMPLEXITY: Moderate     GOALS: Goals reviewed with patient? Yes   SHORT TERM GOALS: Target date: 05/07/2023      Patient will be independent and compliant with initial HEP.    Baseline: see above Goal status: MET Pt reports adherence 05/08/23   2.  Patient will demonstrate at least 60 degrees of Lt cervical rotation AROM to improve ability to complete head turns while driving.  Baseline: see above  Goal status: ongoing   3.  Patient will improve trunk flexion by 25% without pain to improve ability to complete bending activities.  Baseline: see above  Goal status: ongoing     LONG TERM GOALS: Target date: 06/06/23   Patient will score </=30% on the  Modified Oswestry (MCID is 12%) to signify clinically meaningful improvement in functional abilities.  Baseline: see above Goal status: INITIAL   2.  Patient will score </=25% on the NDI to signify clinically meaningful improvement in functional abilities.    Baseline: see above Goal status: INITIAL   3.  Patient will self-report ability to sit for at least 30 minutes to improve her tolerance to driving.  Baseline: 10-15  minutes  Goal status: INITIAL   4.  Patient will be independent with advanced home program to assist in management of her chronic condition.  Baseline: see above Goal status: INITIAL   5.  Patient will demonstrate proper squat mechanics to reduce stress on her back with bending/lifting activity.  Baseline: see above  Goal status: INITIAL     PLAN:   PT FREQUENCY: 2x/week   PT DURATION: 8 weeks   PLANNED INTERVENTIONS: Therapeutic exercises, Therapeutic activity, Neuromuscular re-education, Balance training, Gait training, Patient/Family education, Self Care, Aquatic Therapy, Dry Needling, Cryotherapy, Moist heat, Manual therapy, and Re-evaluation.   PLAN FOR NEXT SESSION: review and progress HEP prn; lumbar mobility, core/hip strengthening, posture strengthening, cervical mobility    Berta Minor, PTA 05/12/2023, 4:11 PM

## 2023-05-15 ENCOUNTER — Ambulatory Visit: Payer: Medicaid Other

## 2023-05-21 ENCOUNTER — Encounter: Payer: Self-pay | Admitting: Physical Therapy

## 2023-05-21 ENCOUNTER — Ambulatory Visit: Payer: Medicaid Other | Admitting: Physical Therapy

## 2023-05-21 DIAGNOSIS — M6281 Muscle weakness (generalized): Secondary | ICD-10-CM | POA: Diagnosis not present

## 2023-05-21 DIAGNOSIS — M797 Fibromyalgia: Secondary | ICD-10-CM

## 2023-05-21 DIAGNOSIS — M542 Cervicalgia: Secondary | ICD-10-CM | POA: Diagnosis not present

## 2023-05-21 DIAGNOSIS — M5459 Other low back pain: Secondary | ICD-10-CM

## 2023-05-21 NOTE — Therapy (Signed)
OUTPATIENT PHYSICAL THERAPY TREATMENT NOTE   Patient Name: Virginia Bradley MRN: [id number] DOB:1986/10/15, 37 y.o., female Today's Date: 05/22/2023  PCP: Renaye Rakers, MD  REFERRING PROVIDER: Pollyann Savoy, MD   END OF SESSION:   PT End of Session - 05/21/23 1529     Visit Number 8    Number of Visits 17    Date for PT Re-Evaluation 06/06/23    Authorization Type [employer]    Authorization Time Period 10 visits 04/13/23-06/12/23    Authorization - Number of Visits 10    PT Start Time 1530    PT Stop Time 1612    PT Time Calculation (min) 42 min    Activity Tolerance Patient tolerated treatment well    Behavior During Therapy [facility] for tasks assessed/performed             Past Medical History:  Diagnosis Date   Anxiety    Complication of anesthesia    Depression    Family history of adverse reaction to anesthesia    GERD (gastroesophageal reflux disease)    History of COVID-19    per pt mild symptoms that resolved 03/ 2022  and 09/ 2022   Ovarian cyst, left    Seizure disorder (HCC)    (03-03-2022   pt stated last seizure seizure approxl 2020/ 2021, none since , no medications) seen 3 different neurologist documented in epic, first seen by dr Merrily Brittle 03-24-2016;  dr Lamount Cranker 06-01-2019;   dr e. feraru ([facility]) in care everywhere [place] 10-05-2019;;  febrile seizures as infant then started 2001 as teen;   hx negative EEG's in epic   Past Surgical History:  Procedure Laterality Date   APPENDECTOMY  2002   LAPAROSCOPIC OOPHORECTOMY Right 2007   per pt  thinks it was right ovary removed   ROBOTIC ASSISTED LAPAROSCOPIC OVARIAN CYSTECTOMY Left 03/06/2022   Procedure: XI ROBOTIC ASSISTED LAPAROSCOPIC OVARIAN CYSTECTOMY;  Surgeon: Toy Baker, DO;  Location: [facility];  Service: Gynecology;  Laterality: Left;   Patient Active Problem List   Diagnosis Date Noted   Recurrent major depressive disorder, in partial remission (HCC) 05/19/2019    Localization-related idiopathic epilepsy and epileptic syndromes with seizures of localized onset, not intractable, without status epilepticus (HCC) 07/20/2018   Seizure disorder (HCC) 03/24/2016   Migraine 03/24/2016    REFERRING DIAG:  M54.50,G89.29 (ICD-10-CM) - Chronic midline low back pain without sciatica  M62.838 (ICD-10-CM) - Trapezius muscle spasm  M79.7 (ICD-10-CM) - Fibromyalgia    THERAPY DIAG:  Other low back pain  Muscle weakness (generalized)  Cervicalgia  Fibromyalgia  Rationale for Evaluation and Treatment Rehabilitation  PERTINENT HISTORY: Anxiety Depression  Seizure disorder (reports no recent seizures)   PRECAUTIONS: None  SUBJECTIVE:  SUBJECTIVE STATEMENT: Patient reports she is achy and sore today.   PAIN:  Are you having pain? Yes: NPRS scale: 3/10 (6/10 BIL shoulders today) Pain location: low back Pain description: spasm,sore,tight Aggravating factors: laying on stomach, sitting Relieving factors: nothing   OBJECTIVE: (objective measures completed at initial evaluation unless otherwise dated)   DIAGNOSTIC FINDINGS:  Lumbar X-ray: No significant disc space narrowing was noted.  Facet joint narrowing was  noted.  No SI joint sclerosis was noted.   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    Cervical X-ray: C5-C6 and C6-C7 narrowing with anterior osteophytes was noted.  Mild facet  joint narrowing was noted.   Impression: These findings are consistent with degenerative disc disease  of the cervical spine.    PATIENT SURVEYS:  Modified Oswestry 21/50; 42% disability  NDI 19/50; 38% disability   SCREENING FOR RED FLAGS: Bowel or bladder incontinence: No Spinal tumors: No Cauda equina syndrome:  No Compression fracture: No Abdominal aneurysm: No   COGNITION: Overall cognitive status: Within functional limits for tasks assessed                          SENSATION: Not tested   MUSCLE LENGTH: Hamstrings: mild tightness bilaterally    POSTURE: rounded shoulders, forward head, and decreased lumbar lordosis   PALPATION: TTP CERVICAL ROM:    Active ROM A/PROM (deg) eval  Flexion 60  Extension 59  Right lateral flexion 35  Left lateral flexion 30 pn  Right rotation 75  Left rotation 51pn   (Blank rows = not tested)   LUMBAR ROM:    AROM eval  Flexion 50% limited pn  Extension WNL  Right lateral flexion WNL  Left lateral flexion WNL  Right rotation WNL  Left rotation WNL   (Blank rows = not tested)   LOWER EXTREMITY ROM:      Active  Right eval Left eval  Hip flexion      Hip extension      Hip abduction      Hip adduction      Hip internal rotation      Hip external rotation      Knee flexion      Knee extension      Ankle dorsiflexion      Ankle plantarflexion      Ankle inversion      Ankle eversion       (Blank rows = not tested)   LOWER EXTREMITY MMT:     MMT Right eval Left eval  Hip flexion 5 4  Hip extension 4 4-  Hip abduction 4+ 4-  Hip adduction      Hip internal rotation      Hip external rotation      Knee flexion      Knee extension      Ankle dorsiflexion      Ankle plantarflexion      Ankle inversion      Ankle eversion       (Blank rows = not tested) UPPER EXTREMITY MMT:   MMT Right eval Left eval  Shoulder flexion 4+ 4  Shoulder extension      Shoulder abduction 5 4  Shoulder adduction      Shoulder extension      Shoulder internal rotation      Shoulder external rotation      Middle trapezius 4 4-  Lower trapezius  Elbow flexion      Elbow extension      Wrist flexion      Wrist extension      Wrist ulnar deviation      Wrist radial deviation      Wrist pronation      Wrist supination      Grip  strength       (Blank rows = not tested)   SPECIAL TESTS:  (-) cervical compression/distraction    FUNCTIONAL TESTS:  5 x STS: 13 seconds   Squat: poor hip hinge, trunk flexion    GAIT: Distance walked: 10 ft  Assistive device utilized: None Level of assistance: Complete Independence Comments: WNL   [facility] Adult PT Treatment:                                                DATE: 05/21/23 Aquatic therapy at [facility] [address] - therapeutic pool temp approximately 92 degrees. Pt enters building ambulating independently. Treatment took place in water 3.8 to  4 ft 8 in.feet deep depending upon activity.  Pt entered and exited the pool via stair and handrails independently.  Therapeutic Exercise: Aquatic Exercise: Walking forward/backwards/side stepping holding rainbow DB by sides Side stepping with shoulder abd/add rainbow DB x2 laps Single leg RDL with noodle 2x10 BIL yellow noodle stomp x15 BIL Step up - fwd/lat - 10x ea Straddling yellow noodle with rainbow DB bicycle kick x3' Standing quad stretch x30" BIL Runners stretch on bottom step x30" BIL Hamstring stretch on bottom step x30" BIL Figure 4 squat stretch, BIL UE support x30" BIL Standing thoracic rotation with noodle x1'   Pt requires the buoyancy of water for active assisted exercises with buoyancy supported for strengthening and AROM exercises. Hydrostatic pressure also supports joints by unweighting joint load by at least 50 % in 3-4 feet depth water. 80% in chest to neck deep water. Water will provide assistance with movement using the current and laminar flow while the buoyancy reduces weight bearing. Pt requires the viscosity of the water for resistance with strengthening exercises.  [facility] Adult PT Treatment:                                                DATE: 05/12/23 Therapeutic Exercise: Nustep level 6 x 6 mins Standing hip abduction/extension RTB at ankles x10 BIL Rows BlueTB 2x10 Shoulder extension  GTB 2x10 Seated horizontal abduction GTB 2x10 DKTC 2x15" Bridges 2x10 SLR with pilates ring push TA activation x10 BIL SKTC 10" hold x5 BIL LTR x10 BIL Seated pball roll outs fwd/lat x10 each Supine QL stretch 2x30" BIL    [facility] Adult PT Treatment:                                                DATE: 05/08/23 Aquatic therapy at [facility] [address] - therapeutic pool temp approximately 92 degrees. Pt enters building ambulating independently. Treatment took place in water 3.8 to  4 ft 8 in.feet deep depending upon activity.  Pt entered and exited the pool via stair and handrails independently.  Therapeutic Exercise: Aquatic Exercise: Walking forward/backwards/side stepping holding rainbow DB by sides Side stepping with shoulder abd/add rainbow DB x2 laps Single leg RDL with noodle x10 BIL Green noodle stomp x15 BIL Straddling yellow noodle with rainbow DB bicycle kick x3' Standing quad stretch x30" BIL Runners stretch on bottom step x30" BIL Hamstring stretch on bottom step x30" BIL Figure 4 squat stretch, BIL UE support x30" BIL Standing thoracic rotation with noodle x1' Cat/cows with pool noodle x1' Shoulder flexion/extension, horizontal abd/add, abd/adduction with pink bells x10 each Standing with UE support edge of pool: Hip abd/add x20 BIL Heel raise x20 Squats 2x20   Pt requires the buoyancy of water for active assisted exercises with buoyancy supported for strengthening and AROM exercises. Hydrostatic pressure also supports joints by unweighting joint load by at least 50 % in 3-4 feet depth water. 80% in chest to neck deep water. Water will provide assistance with movement using the current and laminar flow while the buoyancy reduces weight bearing. Pt requires the viscosity of the water for resistance with strengthening exercises.  [facility] Adult PT Treatment:                                                DATE: 05/05/23 Therapeutic Exercise: Nustep level 6 x 6  mins Standing hip abduction/extension x10 BIL Rows BlueTB 2x10 Shoulder extension GTB 2x10 Seated horizontal abduction GTB 2x10 Seated diagonals GTB x10 BIL DKTC 2x15" Bridges 2x10 SLR with TA activation x10 BIL SKTC 10" hold x5 BIL Supine hamstring stretch with strap 2x30" BIL LTR x10 BIL Sidelying open books x10 BIL Seated pball roll outs fwd/lat x10 each Supine QL stretch x30" BIL         PATIENT EDUCATION:  Education details: see treatment Person educated: Patient Education method: Explanation, Demonstration, Tactile cues, Verbal cues, and Handouts Education comprehension: verbalized understanding, returned demonstration, verbal cues required, tactile cues required, and needs further education   HOME EXERCISE PROGRAM: Access Code: [email or web address] URL: [email or web address] Date: 04/08/2023 Prepared by: Letitia Libra   Exercises - Supine Lower Trunk Rotation  - 1 x daily - 7 x weekly - 2 sets - 10 reps - Supine Single Knee to Chest Stretch  - 1 x daily - 7 x weekly - 2 sets - 10 reps - 5 sec  hold - Seated Scapular Retraction  - 1 x daily - 7 x weekly - 2 sets - 10 reps - Seated Cervical Retraction  - 1 x daily - 7 x weekly - 2 sets - 10 reps   ASSESSMENT:   CLINICAL IMPRESSION: Session today focused on core and LE strengthening as well as overall activity tolerance in the aquatic environment for use of buoyancy to offload joints and the viscosity of water as resistance during therapeutic exercise.  Integrated more functional movements today including step up and sit to stand to good effect.  Patient was able to tolerate all prescribed exercises in the aquatic environment with no adverse effects and reports 5/10 pain at the end of the session. Patient continues to benefit from skilled PT services on land and aquatic based and should be progressed as able to improve functional independence.     OBJECTIVE IMPAIRMENTS: decreased activity tolerance, decreased  endurance, decreased knowledge of condition, difficulty walking, decreased ROM, decreased strength, hypomobility, increased fascial restrictions, impaired flexibility, improper body mechanics, postural dysfunction, and  pain.    ACTIVITY LIMITATIONS: carrying, lifting, bending, sitting, standing, squatting, transfers, and locomotion level   PARTICIPATION LIMITATIONS: meal prep, cleaning, laundry, driving, shopping, community activity, and occupation   PERSONAL FACTORS: Age, Fitness, Past/current experiences, Profession, Time since onset of injury/illness/exacerbation, and 3+ comorbidities: see PMH above  are also affecting patient's functional outcome.    REHAB POTENTIAL: Fair chronicity of injury; co-morbidities   CLINICAL DECISION MAKING: Evolving/moderate complexity   EVALUATION COMPLEXITY: Moderate     GOALS: Goals reviewed with patient? Yes   SHORT TERM GOALS: Target date: 05/07/2023      Patient will be independent and compliant with initial HEP.    Baseline: see above Goal status: MET Pt reports adherence 05/08/23   2.  Patient will demonstrate at least 60 degrees of Lt cervical rotation AROM to improve ability to complete head turns while driving.  Baseline: see above  Goal status: ongoing   3.  Patient will improve trunk flexion by 25% without pain to improve ability to complete bending activities.  Baseline: see above  Goal status: ongoing     LONG TERM GOALS: Target date: 06/06/23   Patient will score </=30% on the Modified Oswestry (MCID is 12%) to signify clinically meaningful improvement in functional abilities.  Baseline: see above Goal status: INITIAL   2.  Patient will score </=25% on the NDI to signify clinically meaningful improvement in functional abilities.    Baseline: see above Goal status: INITIAL   3.  Patient will self-report ability to sit for at least 30 minutes to improve her tolerance to driving.  Baseline: 10-15 minutes  Goal status:  INITIAL   4.  Patient will be independent with advanced home program to assist in management of her chronic condition.  Baseline: see above Goal status: INITIAL   5.  Patient will demonstrate proper squat mechanics to reduce stress on her back with bending/lifting activity.  Baseline: see above  Goal status: INITIAL     PLAN:   PT FREQUENCY: 2x/week   PT DURATION: 8 weeks   PLANNED INTERVENTIONS: Therapeutic exercises, Therapeutic activity, Neuromuscular re-education, Balance training, Gait training, Patient/Family education, Self Care, Aquatic Therapy, Dry Needling, Cryotherapy, Moist heat, Manual therapy, and Re-evaluation.   PLAN FOR NEXT SESSION: review and progress HEP prn; lumbar mobility, core/hip strengthening, posture strengthening, cervical mobility    Fredderick Phenix, PT 05/22/2023, 7:28 AM

## 2023-05-23 DIAGNOSIS — Z419 Encounter for procedure for purposes other than remedying health state, unspecified: Secondary | ICD-10-CM | POA: Diagnosis not present

## 2023-05-25 DIAGNOSIS — R059 Cough, unspecified: Secondary | ICD-10-CM | POA: Diagnosis not present

## 2023-05-25 DIAGNOSIS — J069 Acute upper respiratory infection, unspecified: Secondary | ICD-10-CM | POA: Diagnosis not present

## 2023-05-25 DIAGNOSIS — J029 Acute pharyngitis, unspecified: Secondary | ICD-10-CM | POA: Diagnosis not present

## 2023-05-26 ENCOUNTER — Ambulatory Visit: Payer: Medicaid Other | Attending: Rheumatology

## 2023-05-26 DIAGNOSIS — M6281 Muscle weakness (generalized): Secondary | ICD-10-CM

## 2023-05-26 DIAGNOSIS — M542 Cervicalgia: Secondary | ICD-10-CM | POA: Diagnosis not present

## 2023-05-26 DIAGNOSIS — M5459 Other low back pain: Secondary | ICD-10-CM

## 2023-05-26 DIAGNOSIS — M797 Fibromyalgia: Secondary | ICD-10-CM

## 2023-05-26 NOTE — Therapy (Signed)
OUTPATIENT PHYSICAL THERAPY TREATMENT NOTE   Patient Name: Virginia Bradley MRN: [id number] DOB:September 20, 1986, 37 y.o., female Today's Date: 05/26/2023  PCP: Renaye Rakers, MD  REFERRING PROVIDER: Pollyann Savoy, MD   END OF SESSION:   PT End of Session - 05/26/23 1527     Visit Number 9    Number of Visits 17    Date for PT Re-Evaluation 06/06/23    Authorization Type [employer]    Authorization Time Period 10 visits 04/13/23-06/12/23    Authorization - Visit Number 7    Authorization - Number of Visits 10    PT Start Time 1530    PT Stop Time 1615    PT Time Calculation (min) 45 min    Activity Tolerance Patient tolerated treatment well    Behavior During Therapy [facility] for tasks assessed/performed              Past Medical History:  Diagnosis Date   Anxiety    Complication of anesthesia    Depression    Family history of adverse reaction to anesthesia    GERD (gastroesophageal reflux disease)    History of COVID-19    per pt mild symptoms that resolved 03/ 2022  and 09/ 2022   Ovarian cyst, left    Seizure disorder (HCC)    (03-03-2022   pt stated last seizure seizure approxl 2020/ 2021, none since , no medications) seen 3 different neurologist documented in epic, first seen by dr Merrily Brittle 03-24-2016;  dr Lamount Cranker 06-01-2019;   dr e. feraru ([facility] in [facility] 10-05-2019;;  febrile seizures as infant then started 2001 as teen;   hx negative EEG's in epic   Past Surgical History:  Procedure Laterality Date   APPENDECTOMY  2002   LAPAROSCOPIC OOPHORECTOMY Right 2007   per pt  thinks it was right ovary removed   ROBOTIC ASSISTED LAPAROSCOPIC OVARIAN CYSTECTOMY Left 03/06/2022   Procedure: XI ROBOTIC ASSISTED LAPAROSCOPIC OVARIAN CYSTECTOMY;  Surgeon: Toy Baker, DO;  Location: [facility];  Service: Gynecology;  Laterality: Left;   Patient Active Problem List   Diagnosis Date Noted   Recurrent major depressive disorder, in  partial remission (HCC) 05/19/2019   Localization-related idiopathic epilepsy and epileptic syndromes with seizures of localized onset, not intractable, without status epilepticus (HCC) 07/20/2018   Seizure disorder (HCC) 03/24/2016   Migraine 03/24/2016    REFERRING DIAG:  M54.50,G89.29 (ICD-10-CM) - Chronic midline low back pain without sciatica  M62.838 (ICD-10-CM) - Trapezius muscle spasm  M79.7 (ICD-10-CM) - Fibromyalgia    THERAPY DIAG:  Other low back pain  Muscle weakness (generalized)  Cervicalgia  Fibromyalgia  Rationale for Evaluation and Treatment Rehabilitation  PERTINENT HISTORY: Anxiety Depression  Seizure disorder (reports no recent seizures)   PRECAUTIONS: None  SUBJECTIVE:  SUBJECTIVE STATEMENT: Patient reports increased pain in lower back today, stating that she didn't go to work today because of her back pain.    PAIN:  Are you having pain? Yes: NPRS scale: 8/10 (6/10 BIL shoulders today) Pain location: low back Pain description: spasm,sore,tight Aggravating factors: laying on stomach, sitting Relieving factors: nothing   OBJECTIVE: (objective measures completed at initial evaluation unless otherwise dated)   DIAGNOSTIC FINDINGS:  Lumbar X-ray: No significant disc space narrowing was noted.  Facet joint narrowing was  noted.  No SI joint sclerosis was noted.   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    Cervical X-ray: C5-C6 and C6-C7 narrowing with anterior osteophytes was noted.  Mild facet  joint narrowing was noted.   Impression: These findings are consistent with degenerative disc disease  of the cervical spine.    PATIENT SURVEYS:  Modified Oswestry 21/50; 42% disability  NDI 19/50; 38% disability    SCREENING FOR RED FLAGS: Bowel or bladder incontinence: No Spinal tumors: No Cauda equina syndrome: No Compression fracture: No Abdominal aneurysm: No   COGNITION: Overall cognitive status: Within functional limits for tasks assessed                          SENSATION: Not tested   MUSCLE LENGTH: Hamstrings: mild tightness bilaterally    POSTURE: rounded shoulders, forward head, and decreased lumbar lordosis   PALPATION: TTP CERVICAL ROM:    Active ROM A/PROM (deg) eval  Flexion 60  Extension 59  Right lateral flexion 35  Left lateral flexion 30 pn  Right rotation 75  Left rotation 51pn   (Blank rows = not tested)   LUMBAR ROM:    AROM eval  Flexion 50% limited pn  Extension WNL  Right lateral flexion WNL  Left lateral flexion WNL  Right rotation WNL  Left rotation WNL   (Blank rows = not tested)   LOWER EXTREMITY ROM:      Active  Right eval Left eval  Hip flexion      Hip extension      Hip abduction      Hip adduction      Hip internal rotation      Hip external rotation      Knee flexion      Knee extension      Ankle dorsiflexion      Ankle plantarflexion      Ankle inversion      Ankle eversion       (Blank rows = not tested)   LOWER EXTREMITY MMT:     MMT Right eval Left eval  Hip flexion 5 4  Hip extension 4 4-  Hip abduction 4+ 4-  Hip adduction      Hip internal rotation      Hip external rotation      Knee flexion      Knee extension      Ankle dorsiflexion      Ankle plantarflexion      Ankle inversion      Ankle eversion       (Blank rows = not tested) UPPER EXTREMITY MMT:   MMT Right eval Left eval  Shoulder flexion 4+ 4  Shoulder extension      Shoulder abduction 5 4  Shoulder adduction      Shoulder extension      Shoulder internal rotation      Shoulder  external rotation      Middle trapezius 4 4-  Lower trapezius      Elbow flexion      Elbow extension      Wrist flexion      Wrist extension       Wrist ulnar deviation      Wrist radial deviation      Wrist pronation      Wrist supination      Grip strength       (Blank rows = not tested)   SPECIAL TESTS:  (-) cervical compression/distraction    FUNCTIONAL TESTS:  5 x STS: 13 seconds   Squat: poor hip hinge, trunk flexion    GAIT: Distance walked: 10 ft  Assistive device utilized: None Level of assistance: Complete Independence Comments: WNL   [facility] Adult PT Treatment:                                                DATE: 05/26/23 Therapeutic Exercise: Nustep level 6 x 6 mins Standing hip abduction/extension RTB at ankles x10 BIL Rows BlueTB 2x10 Shoulder extension GTB 2x10 Seated horizontal abduction RTB 2x10 Seated diagonals RTB x10 BIL DKTC 5x15" Bridges 2x10 SKTC 10" hold x5 BIL LTR x10 BIL Supine QL stretch 2x30" BIL Supine marching 3x30" Supine hamstring stretch with strap 2x30" BIL Modalities: MHP during supine exercises      [facility] Adult PT Treatment:                                                DATE: 05/21/23 Aquatic therapy at [facility]- [address] - therapeutic pool temp approximately 92 degrees. Pt enters building ambulating independently. Treatment took place in water 3.8 to  4 ft 8 in.feet deep depending upon activity.  Pt entered and exited the pool via stair and handrails independently.  Therapeutic Exercise: Aquatic Exercise: Walking forward/backwards/side stepping holding rainbow DB by sides Side stepping with shoulder abd/add rainbow DB x2 laps Single leg RDL with noodle 2x10 BIL yellow noodle stomp x15 BIL Step up - fwd/lat - 10x ea Straddling yellow noodle with rainbow DB bicycle kick x3' Standing quad stretch x30" BIL Runners stretch on bottom step x30" BIL Hamstring stretch on bottom step x30" BIL Figure 4 squat stretch, BIL UE support x30" BIL Standing thoracic rotation with noodle x1'   Pt requires the buoyancy of water for active assisted exercises with buoyancy  supported for strengthening and AROM exercises. Hydrostatic pressure also supports joints by unweighting joint load by at least 50 % in 3-4 feet depth water. 80% in chest to neck deep water. Water will provide assistance with movement using the current and laminar flow while the buoyancy reduces weight bearing. Pt requires the viscosity of the water for resistance with strengthening exercises.  [facility] Adult PT Treatment:                                                DATE: 05/12/23 Therapeutic Exercise: Nustep level 6 x 6 mins Standing hip abduction/extension RTB at ankles x10 BIL Rows BlueTB 2x10 Shoulder extension GTB  2x10 Seated horizontal abduction GTB 2x10 DKTC 2x15" Bridges 2x10 SLR with pilates ring push TA activation x10 BIL SKTC 10" hold x5 BIL LTR x10 BIL Seated pball roll outs fwd/lat x10 each Supine QL stretch 2x30" BIL         PATIENT EDUCATION:  Education details: see treatment Person educated: Patient Education method: Explanation, Demonstration, Tactile cues, Verbal cues, and Handouts Education comprehension: verbalized understanding, returned demonstration, verbal cues required, tactile cues required, and needs further education   HOME EXERCISE PROGRAM: Access Code: [email or web address] URL: [email or web address] Date: 04/08/2023 Prepared by: Letitia Libra   Exercises - Supine Lower Trunk Rotation  - 1 x daily - 7 x weekly - 2 sets - 10 reps - Supine Single Knee to Chest Stretch  - 1 x daily - 7 x weekly - 2 sets - 10 reps - 5 sec  hold - Seated Scapular Retraction  - 1 x daily - 7 x weekly - 2 sets - 10 reps - Seated Cervical Retraction  - 1 x daily - 7 x weekly - 2 sets - 10 reps   ASSESSMENT:   CLINICAL IMPRESSION: Patient presents to PT reporting increased lower back pain today, states there was not a specific cause, she just woke up today with increased pain. Session today focused on periscapular, proximal hip and core strengthening with slight regression  today to accommodate increased pain. Patient was able to tolerate all prescribed exercises with no adverse effects. Patient continues to benefit from skilled PT services and should be progressed as able to improve functional independence.     OBJECTIVE IMPAIRMENTS: decreased activity tolerance, decreased endurance, decreased knowledge of condition, difficulty walking, decreased ROM, decreased strength, hypomobility, increased fascial restrictions, impaired flexibility, improper body mechanics, postural dysfunction, and pain.    ACTIVITY LIMITATIONS: carrying, lifting, bending, sitting, standing, squatting, transfers, and locomotion level   PARTICIPATION LIMITATIONS: meal prep, cleaning, laundry, driving, shopping, community activity, and occupation   PERSONAL FACTORS: Age, Fitness, Past/current experiences, Profession, Time since onset of injury/illness/exacerbation, and 3+ comorbidities: see PMH above  are also affecting patient's functional outcome.    REHAB POTENTIAL: Fair chronicity of injury; co-morbidities   CLINICAL DECISION MAKING: Evolving/moderate complexity   EVALUATION COMPLEXITY: Moderate     GOALS: Goals reviewed with patient? Yes   SHORT TERM GOALS: Target date: 05/07/2023      Patient will be independent and compliant with initial HEP.    Baseline: see above Goal status: MET Pt reports adherence 05/08/23   2.  Patient will demonstrate at least 60 degrees of Lt cervical rotation AROM to improve ability to complete head turns while driving.  Baseline: see above  Goal status: ongoing   3.  Patient will improve trunk flexion by 25% without pain to improve ability to complete bending activities.  Baseline: see above  Goal status: ongoing     LONG TERM GOALS: Target date: 06/06/23   Patient will score </=30% on the Modified Oswestry (MCID is 12%) to signify clinically meaningful improvement in functional abilities.  Baseline: see above Goal status: INITIAL   2.   Patient will score </=25% on the NDI to signify clinically meaningful improvement in functional abilities.    Baseline: see above Goal status: INITIAL   3.  Patient will self-report ability to sit for at least 30 minutes to improve her tolerance to driving.  Baseline: 10-15 minutes  Goal status: INITIAL   4.  Patient will be independent with advanced home program to assist in management of her  chronic condition.  Baseline: see above Goal status: INITIAL   5.  Patient will demonstrate proper squat mechanics to reduce stress on her back with bending/lifting activity.  Baseline: see above  Goal status: INITIAL     PLAN:   PT FREQUENCY: 2x/week   PT DURATION: 8 weeks   PLANNED INTERVENTIONS: Therapeutic exercises, Therapeutic activity, Neuromuscular re-education, Balance training, Gait training, Patient/Family education, Self Care, Aquatic Therapy, Dry Needling, Cryotherapy, Moist heat, Manual therapy, and Re-evaluation.   PLAN FOR NEXT SESSION: review and progress HEP prn; lumbar mobility, core/hip strengthening, posture strengthening, cervical mobility    Berta Minor, PTA 05/26/2023, 4:10 PM

## 2023-06-08 DIAGNOSIS — N946 Dysmenorrhea, unspecified: Secondary | ICD-10-CM | POA: Diagnosis not present

## 2023-06-08 DIAGNOSIS — R350 Frequency of micturition: Secondary | ICD-10-CM | POA: Diagnosis not present

## 2023-06-08 DIAGNOSIS — F411 Generalized anxiety disorder: Secondary | ICD-10-CM | POA: Diagnosis not present

## 2023-06-08 DIAGNOSIS — Z130 Encounter for screening for diseases of the blood and blood-forming organs and certain disorders involving the immune mechanism: Secondary | ICD-10-CM | POA: Diagnosis not present

## 2023-06-08 DIAGNOSIS — Z13220 Encounter for screening for lipoid disorders: Secondary | ICD-10-CM | POA: Diagnosis not present

## 2023-06-08 DIAGNOSIS — E559 Vitamin D deficiency, unspecified: Secondary | ICD-10-CM | POA: Diagnosis not present

## 2023-06-08 DIAGNOSIS — F3341 Major depressive disorder, recurrent, in partial remission: Secondary | ICD-10-CM | POA: Diagnosis not present

## 2023-06-08 DIAGNOSIS — Z0000 Encounter for general adult medical examination without abnormal findings: Secondary | ICD-10-CM | POA: Diagnosis not present

## 2023-06-08 DIAGNOSIS — Z131 Encounter for screening for diabetes mellitus: Secondary | ICD-10-CM | POA: Diagnosis not present

## 2023-06-08 DIAGNOSIS — G4710 Hypersomnia, unspecified: Secondary | ICD-10-CM | POA: Diagnosis not present

## 2023-06-09 ENCOUNTER — Ambulatory Visit: Payer: Medicaid Other | Admitting: Physical Therapy

## 2023-06-11 ENCOUNTER — Ambulatory Visit: Payer: Medicaid Other | Admitting: Physical Therapy

## 2023-06-16 ENCOUNTER — Ambulatory Visit: Payer: Medicaid Other | Admitting: Physical Therapy

## 2023-06-22 DIAGNOSIS — Z419 Encounter for procedure for purposes other than remedying health state, unspecified: Secondary | ICD-10-CM | POA: Diagnosis not present

## 2023-06-23 ENCOUNTER — Ambulatory Visit: Payer: Medicaid Other | Admitting: Physical Therapy

## 2023-06-30 ENCOUNTER — Ambulatory Visit: Payer: Medicaid Other | Admitting: Physical Therapy

## 2023-07-17 DIAGNOSIS — N644 Mastodynia: Secondary | ICD-10-CM | POA: Diagnosis not present

## 2023-07-17 DIAGNOSIS — S46311A Strain of muscle, fascia and tendon of triceps, right arm, initial encounter: Secondary | ICD-10-CM | POA: Diagnosis not present

## 2023-07-17 DIAGNOSIS — Z23 Encounter for immunization: Secondary | ICD-10-CM | POA: Diagnosis not present

## 2023-07-17 DIAGNOSIS — Z111 Encounter for screening for respiratory tuberculosis: Secondary | ICD-10-CM | POA: Diagnosis not present

## 2023-07-23 DIAGNOSIS — Z419 Encounter for procedure for purposes other than remedying health state, unspecified: Secondary | ICD-10-CM | POA: Diagnosis not present

## 2023-08-03 NOTE — Progress Notes (Signed)
Office Visit Note  Patient: Virginia Bradley             Date of Birth: 1986-09-26           MRN: [id number]             PCP: Renaye Rakers, MD Referring: Renaye Rakers, MD Visit Date: 08/11/2023 Occupation: @GUAROCC @  Subjective:  Pain in joints and muscles  History of Present Illness: Virginia Bradley is a 37 y.o. female with osteoarthritis and degenerative disc disease.  She states she went for water therapy which she just finished in July.  She stated water therapy helped her with the neck muscle spasm.  Although she continues to have discomfort in her neck and lower back.  Lower back pain is intermittent.  Her right hand pain completely resolved.  She denies any history of joint swelling.  She continues to have generalized pain and discomfort from fibromyalgia.  She states that despite sleeping well through the night she still feels tired.  She was taking vitamin D supplement and recently her vitamin D was normal in June when it was done by her PCP.  She is not taking any over-the-counter supplements right now.  She continues to have some discomfort in the shoulders.  She has been walking 3 times a week.  She also does a stretch bands and lifts some weights.    Activities of Daily Living:  Patient reports morning stiffness for 20 minutes.   Patient Reports nocturnal pain.  Difficulty dressing/grooming: Reports Difficulty climbing stairs: Denies Difficulty getting out of chair: Denies Difficulty using hands for taps, buttons, cutlery, and/or writing: Denies  Review of Systems  Constitutional:  Positive for fatigue.  HENT:  Negative for mouth sores and mouth dryness.   Eyes:  Negative for dryness.  Respiratory:  Negative for shortness of breath.   Cardiovascular:  Negative for chest pain and palpitations.  Gastrointestinal:  Negative for blood in stool, constipation and diarrhea.  Endocrine: Negative for increased urination.  Genitourinary:  Negative for involuntary urination.   Musculoskeletal:  Positive for myalgias, morning stiffness, muscle tenderness and myalgias. Negative for joint pain, gait problem, joint pain, joint swelling and muscle weakness.  Skin:  Negative for color change, rash, hair loss and sensitivity to sunlight.  Allergic/Immunologic: Positive for susceptible to infections.  Neurological:  Negative for dizziness and headaches.  Hematological:  Negative for swollen glands.  Psychiatric/Behavioral:  Positive for sleep disturbance. Negative for depressed mood. The patient is not nervous/anxious.     PMFS History:  Patient Active Problem List   Diagnosis Date Noted   Recurrent major depressive disorder, in partial remission (HCC) 05/19/2019   Localization-related idiopathic epilepsy and epileptic syndromes with seizures of localized onset, not intractable, without status epilepticus (HCC) 07/20/2018   Seizure disorder (HCC) 03/24/2016   Migraine 03/24/2016    Past Medical History:  Diagnosis Date   Anxiety    Complication of anesthesia    Depression    Family history of adverse reaction to anesthesia    GERD (gastroesophageal reflux disease)    History of COVID-19    per pt mild symptoms that resolved 03/ 2022  and 09/ 2022   Ovarian cyst, left    Seizure disorder (HCC)    (03-03-2022   pt stated last seizure seizure approxl 2020/ 2021, none since , no medications) seen 3 different neurologist documented in epic, first seen by dr Merrily Brittle 03-24-2016;  dr Lamount Cranker 06-01-2019;   dr e. feraru ([facility])  in care everywhere [facility] 10-05-2019;;  febrile seizures as infant then started 2001 as teen;   hx negative EEG's in epic    Family History  Problem Relation Age of Onset   Seizures Mother    Other Father        smoldering myeloma   Hypertension Father    Crohn's disease Brother    Hypertension Maternal Grandmother    Past Surgical History:  Procedure Laterality Date   APPENDECTOMY  2002   LAPAROSCOPIC OOPHORECTOMY Right 2007    per pt  thinks it was right ovary removed   ROBOTIC ASSISTED LAPAROSCOPIC OVARIAN CYSTECTOMY Left 03/06/2022   Procedure: XI ROBOTIC ASSISTED LAPAROSCOPIC OVARIAN CYSTECTOMY;  Surgeon: Toy Baker, DO;  [facility];  Service: Gynecology;  Laterality: Left;   Social History   Social History Narrative   Pt lives alone in 1 story home, on the second floor   Has associates degree   Works in daycare facility    There is no immunization history on file for this patient.   Objective: Vital Signs: BP 97/65 (BP Location: Left Arm, Patient Position: Sitting, Cuff Size: Large)   Pulse 80   Resp 17   Ht 5\' 7"  (1.702 m)   Wt 199 lb (90.3 kg)   BMI 31.17 kg/m    Physical Exam Vitals and nursing note reviewed.  Constitutional:      Appearance: She is well-developed.  HENT:     Head: Normocephalic and atraumatic.  Eyes:     Conjunctiva/sclera: Conjunctivae normal.  Cardiovascular:     Rate and Rhythm: Normal rate and regular rhythm.     Heart sounds: Normal heart sounds.  Pulmonary:     Effort: Pulmonary effort is normal.     Breath sounds: Normal breath sounds.  Abdominal:     General: Bowel sounds are normal.     Palpations: Abdomen is soft.  Musculoskeletal:     Cervical back: Normal range of motion.  Lymphadenopathy:     Cervical: No cervical adenopathy.  Skin:    General: Skin is warm and dry.     Capillary Refill: Capillary refill takes less than 2 seconds.  Neurological:     Mental Status: She is alert and oriented to person, place, and time.  Psychiatric:        Behavior: Behavior normal.      Musculoskeletal Exam: Patient had good range of motion of the cervical spine with some stiffness.  She had bilateral trapezius spasm.  She good range of motion of the lumbar spine with minimal discomfort.  Shoulder joints, elbow joints, wrist joints, MCPs PIPs and DIPs were in good range of motion with no synovitis.  Hip joints and knee joints with good  range of motion.  No warmth swelling or effusion was noted.  There was no tenderness over ankles or MTPs.  CDAI Exam: CDAI Score: -- Patient Global: --; Provider Global: -- Swollen: --; Tender: -- Joint Exam 08/11/2023   No joint exam has been documented for this visit   There is currently no information documented on the homunculus. Go to the Rheumatology activity and complete the homunculus joint exam.  Investigation: No additional findings.  Imaging: No results found.  Recent Labs: Lab Results  Component Value Date   WBC 10.5 09/08/2022   HGB 14.0 09/08/2022   PLT 445 (H) 09/08/2022   NA 138 09/08/2022   K 3.7 09/08/2022   CL 107 09/08/2022   CO2 22 09/08/2022  GLUCOSE 99 09/08/2022   BUN 10 09/08/2022   CREATININE 0.73 09/08/2022   BILITOT 0.1 (L) 09/08/2022   ALKPHOS 79 09/08/2022   AST 12 (L) 09/08/2022   ALT 14 09/08/2022   PROT 6.6 09/08/2022   ALBUMIN 3.9 09/08/2022   CALCIUM 9.3 09/08/2022   GFRAA >60 01/28/2019    Speciality Comments: No specialty comments available.  Procedures:  Trigger Point Inj  Date/Time: 08/11/2023 4:00 PM  Performed by: Pollyann Savoy, MD Authorized by: Pollyann Savoy, MD   Consent Given by:  Patient Site marked: the procedure site was marked   Timeout: prior to procedure the correct patient, procedure, and site was verified   Indications:  Muscle spasm and pain Total # of Trigger Points:  2 Location: neck   Needle Size:  27 G Approach:  Dorsal Medications #1:  0.5 mL lidocaine 1 %; 20 mg triamcinolone acetonide 40 MG/ML Medications #2:  0.5 mL lidocaine 1 %; 20 mg triamcinolone acetonide 40 MG/ML Patient tolerance:  Patient tolerated the procedure well with no immediate complications  Allergies: Depakote er [divalproex sodium er], Valproic acid, and Dilantin [phenytoin sodium extended]   Assessment / Plan:     Visit Diagnoses: DDD (degenerative disc disease), cervical -patient went for water therapy after the  last visit in February.  She states she just finished water therapy in July.  She had improvement in the numbness cervical muscle spasm and discomfort.  She still continues to have a stiffness in her neck.  X-rays showed mild spondylosis and facet joint arthropathy.  Most prominent narrowing was noted between C5-6 and C6-C7.  A handout on neck exercises was given.  Trapezius muscle spasm-she continues to have bilateral trapezius spasm.  She gives history of constant discomfort in the trapezius region.  I discussed the option of trigger point injections.  Patient wants to proceed with the trigger point injections.  Side effects were reviewed.  The trigger point injections were given as described above.  Patient tolerated the procedure well.  Postprocedure instructions were given.  Arthropathy of lumbar facet joint -she continues to have intermittent lower back pain.  History of lower back pain for several years.  X-rays showed facet joint arthropathy.  A handout on lower back exercises was given.  Pain in right hand - X-rays were unremarkable.  Patient states that the pain has resolved.  Vitamin D deficiency-she had vitamin D deficiency with vitamin D 12 on January 14, 2023.  She had repeat vitamin D in June which was normal.  She had vitamin D 50,000 units once a week for 3 months prior to it.  Patient is not taking vitamin D now.  I advised her to take vitamin D 2000 units daily.  She should have repeat vitamin D in 6 months.  Fibromyalgia -she continues to have generalized pain, hyperalgesia and tender points.  All autoimmune workup negative.  She went to water therapy which was helpful.  She has been walking 3 times a week and also doing some weight training.  I advised her to go for swimming and water aerobics.  Patient states she has an access to the swimming pool and will start swimming.  Primary insomnia-she sleeps through the night.  Does not feel rested.  Other fatigue-she continues to have  fatigue.  Anxiety and depression-she is on Lexapro.  Other medical problems listed as follows:  Localization-related idiopathic epilepsy and epileptic syndromes with seizures of localized onset, not intractable, without status epilepticus (HCC)  Thrombocytosis  Hx of  migraines  Benign teratoma of ovary, unspecified laterality  Family history of Crohn's disease-brother  Orders: Orders Placed This Encounter  Procedures   Trigger Point Inj   No orders of the defined types were placed in this encounter.    Follow-Up Instructions: Return in about 6 months (around 02/11/2024) for Osteoarthritis.   Pollyann Savoy, MD  Note - This record has been created using Animal nutritionist.  Chart creation errors have been sought, but may not always  have been located. Such creation errors do not reflect on  the standard of medical care.

## 2023-08-06 ENCOUNTER — Ambulatory Visit: Payer: Commercial Managed Care - HMO | Admitting: Rheumatology

## 2023-08-11 ENCOUNTER — Ambulatory Visit: Payer: Medicaid Other | Attending: Rheumatology | Admitting: Rheumatology

## 2023-08-11 ENCOUNTER — Encounter: Payer: Self-pay | Admitting: Rheumatology

## 2023-08-11 VITALS — BP 97/65 | HR 80 | Resp 17 | Ht 67.0 in | Wt 199.0 lb

## 2023-08-11 DIAGNOSIS — M797 Fibromyalgia: Secondary | ICD-10-CM | POA: Diagnosis not present

## 2023-08-11 DIAGNOSIS — M5030 Other cervical disc degeneration, unspecified cervical region: Secondary | ICD-10-CM

## 2023-08-11 DIAGNOSIS — F32A Depression, unspecified: Secondary | ICD-10-CM

## 2023-08-11 DIAGNOSIS — F5101 Primary insomnia: Secondary | ICD-10-CM

## 2023-08-11 DIAGNOSIS — F419 Anxiety disorder, unspecified: Secondary | ICD-10-CM | POA: Diagnosis not present

## 2023-08-11 DIAGNOSIS — Z8669 Personal history of other diseases of the nervous system and sense organs: Secondary | ICD-10-CM

## 2023-08-11 DIAGNOSIS — G40009 Localization-related (focal) (partial) idiopathic epilepsy and epileptic syndromes with seizures of localized onset, not intractable, without status epilepticus: Secondary | ICD-10-CM | POA: Diagnosis not present

## 2023-08-11 DIAGNOSIS — D279 Benign neoplasm of unspecified ovary: Secondary | ICD-10-CM

## 2023-08-11 DIAGNOSIS — E559 Vitamin D deficiency, unspecified: Secondary | ICD-10-CM

## 2023-08-11 DIAGNOSIS — D75839 Thrombocytosis, unspecified: Secondary | ICD-10-CM

## 2023-08-11 DIAGNOSIS — M79641 Pain in right hand: Secondary | ICD-10-CM | POA: Diagnosis not present

## 2023-08-11 DIAGNOSIS — R5383 Other fatigue: Secondary | ICD-10-CM | POA: Diagnosis not present

## 2023-08-11 DIAGNOSIS — M47816 Spondylosis without myelopathy or radiculopathy, lumbar region: Secondary | ICD-10-CM | POA: Diagnosis not present

## 2023-08-11 DIAGNOSIS — M62838 Other muscle spasm: Secondary | ICD-10-CM

## 2023-08-11 DIAGNOSIS — Z8379 Family history of other diseases of the digestive system: Secondary | ICD-10-CM

## 2023-08-11 MED ORDER — LIDOCAINE HCL 1 % IJ SOLN
0.5000 mL | INTRAMUSCULAR | Status: AC | PRN
Start: 2023-08-11 — End: 2023-08-11
  Administered 2023-08-11: .5 mL

## 2023-08-11 MED ORDER — TRIAMCINOLONE ACETONIDE 40 MG/ML IJ SUSP
20.0000 mg | INTRAMUSCULAR | Status: AC | PRN
Start: 2023-08-11 — End: 2023-08-11
  Administered 2023-08-11: 20 mg via INTRAMUSCULAR

## 2023-08-11 NOTE — Patient Instructions (Signed)
 Cervical Strain and Sprain Rehab Ask your health care provider which exercises are safe for you. Do exercises exactly as told by your health care provider and adjust them as directed. It is normal to feel mild stretching, pulling, tightness, or discomfort as you do these exercises. Stop right away if you feel sudden pain or your pain gets worse. Do not begin these exercises until told by your health care provider. Stretching and range-of-motion exercises Cervical side bending  Using good posture, sit on a stable chair or stand up. Without moving your shoulders, slowly tilt your left / right ear to your shoulder until you feel a stretch in the neck muscles on the opposite side. You should be looking straight ahead. Hold for __________ seconds. Repeat with the other side of your neck. Repeat __________ times. Complete this exercise __________ times a day. Cervical rotation  Using good posture, sit on a stable chair or stand up. Slowly turn your head to the side as if you are looking over your left / right shoulder. Keep your eyes level with the ground. Stop when you feel a stretch along the side and the back of your neck. Hold for __________ seconds. Repeat this by turning to your other side. Repeat __________ times. Complete this exercise __________ times a day. Thoracic extension and pectoral stretch  Roll a towel or a small blanket so it is about 4 inches (10 cm) in diameter. Lie down on your back on a firm surface. Put the towel in the middle of your back across your spine. It should not be under your shoulder blades. Put your hands behind your head and let your elbows fall out to your sides. Hold for __________ seconds. Repeat __________ times. Complete this exercise __________ times a day. Strengthening exercises Upper cervical flexion  Lie on your back with a thin pillow behind your head or a small, rolled-up towel under your neck. Gently tuck your chin toward your chest and nod  your head down to look toward your feet. Do not lift your head off the pillow. Hold for __________ seconds. Release the tension slowly. Relax your neck muscles completely before you repeat this exercise. Repeat __________ times. Complete this exercise __________ times a day. Cervical extension  Stand about 6 inches (15 cm) away from a wall, with your back facing the wall. Place a soft object, about 6-8 inches (15-20 cm) in diameter, between the back of your head and the wall. A soft object could be a small pillow, a ball, or a folded towel. Gently tilt your head back and press into the soft object. Keep your jaw and forehead relaxed. Hold for __________ seconds. Release the tension slowly. Relax your neck muscles completely before you repeat this exercise. Repeat __________ times. Complete this exercise __________ times a day. Posture and body mechanics Body mechanics refer to the movements and positions of your body while you do your daily activities. Posture is part of body mechanics. Good posture and healthy body mechanics can help to relieve stress in your body's tissues and joints. Good posture means that your spine is in its natural S-curve position (your spine is neutral), your shoulders are pulled back slightly, and your head is not tipped forward. The following are general guidelines for using improved posture and body mechanics in your everyday activities. Sitting  When sitting, keep your spine neutral and keep your feet flat on the floor. Use a footrest, if needed, and keep your thighs parallel to the floor. Avoid rounding  your shoulders. Avoid tilting your head forward. When working at a desk or a computer, keep your desk at a height where your hands are slightly lower than your elbows. Slide your chair under your desk so you are close enough to maintain good posture. When working at a computer, place your monitor at a height where you are looking straight ahead and you do not have to  tilt your head forward or downward to look at the screen. Standing  When standing, keep your spine neutral and keep your feet about hip-width apart. Keep a slight bend in your knees. Your ears, shoulders, and hips should line up. When you do a task in which you stand in one place for a long time, place one foot up on a stable object that is 2-4 inches (5-10 cm) high, such as a footstool. This helps keep your spine neutral. Resting When lying down and resting, avoid positions that are most painful for you. Try to support your neck in a neutral position. You can use a contour pillow or a small rolled-up towel. Your pillow should support your neck but not push on it. This information is not intended to replace advice given to you by your health care provider. Make sure you discuss any questions you have with your health care provider. Document Revised: 04/13/2023 Document Reviewed: 06/30/2022 Elsevier Patient Education  2024 [employer] Low Back Sprain or Strain Rehab Ask your health care provider which exercises are safe for you. Do exercises exactly as told by your health care provider and adjust them as directed. It is normal to feel mild stretching, pulling, tightness, or discomfort as you do these exercises. Stop right away if you feel sudden pain or your pain gets worse. Do not begin these exercises until told by your health care provider. Stretching and range-of-motion exercises These exercises warm up your muscles and joints and improve the movement and flexibility of your back. These exercises also help to relieve pain, numbness, and tingling. Lumbar rotation  Lie on your back on a firm bed or the floor with your knees bent. Straighten your arms out to your sides so each arm forms a 90-degree angle (right angle) with a side of your body. Slowly move (rotate) both of your knees to one side of your body until you feel a stretch in your lower back (lumbar). Try not to let your shoulders lift  off the floor. Hold this position for __________ seconds. Tense your abdominal muscles and slowly move your knees back to the starting position. Repeat this exercise on the other side of your body. Repeat __________ times. Complete this exercise __________ times a day. Single knee to chest  Lie on your back on a firm bed or the floor with both legs straight. Bend one of your knees. Use your hands to move your knee up toward your chest until you feel a gentle stretch in your lower back and buttock. Hold your leg in this position by holding on to the front of your knee. Keep your other leg as straight as possible. Hold this position for __________ seconds. Slowly return to the starting position. Repeat with your other leg. Repeat __________ times. Complete this exercise __________ times a day. Prone extension on elbows  Lie on your abdomen on a firm bed or the floor (prone position). Prop yourself up on your elbows. Use your arms to help lift your chest up until you feel a gentle stretch in your abdomen and your lower back.  This will place some of your body weight on your elbows. If this is uncomfortable, try stacking pillows under your chest. Your hips should stay down, against the surface that you are lying on. Keep your hip and back muscles relaxed. Hold this position for __________ seconds. Slowly relax your upper body and return to the starting position. Repeat __________ times. Complete this exercise __________ times a day. Strengthening exercises These exercises build strength and endurance in your back. Endurance is the ability to use your muscles for a long time, even after they get tired. Pelvic tilt This exercise strengthens the muscles that lie deep in the abdomen. Lie on your back on a firm bed or the floor with your legs extended. Bend your knees so they are pointing toward the ceiling and your feet are flat on the floor. Tighten your lower abdominal muscles to press your  lower back against the floor. This motion will tilt your pelvis so your tailbone points up toward the ceiling instead of pointing to your feet or the floor. To help with this exercise, you may place a small towel under your lower back and try to push your back into the towel. Hold this position for __________ seconds. Let your muscles relax completely before you repeat this exercise. Repeat __________ times. Complete this exercise __________ times a day. Alternating arm and leg raises  Get on your hands and knees on a firm surface. If you are on a hard floor, you may want to use padding, such as an exercise mat, to cushion your knees. Line up your arms and legs. Your hands should be directly below your shoulders, and your knees should be directly below your hips. Lift your left leg behind you. At the same time, raise your right arm and straighten it in front of you. Do not lift your leg higher than your hip. Do not lift your arm higher than your shoulder. Keep your abdominal and back muscles tight. Keep your hips facing the ground. Do not arch your back. Keep your balance carefully, and do not hold your breath. Hold this position for __________ seconds. Slowly return to the starting position. Repeat with your right leg and your left arm. Repeat __________ times. Complete this exercise __________ times a day. Abdominal set with straight leg raise  Lie on your back on a firm bed or the floor. Bend one of your knees and keep your other leg straight. Tense your abdominal muscles and lift your straight leg up, 4-6 inches (10-15 cm) off the ground. Keep your abdominal muscles tight and hold this position for __________ seconds. Do not hold your breath. Do not arch your back. Keep it flat against the ground. Keep your abdominal muscles tense as you slowly lower your leg back to the starting position. Repeat with your other leg. Repeat __________ times. Complete this exercise __________ times a  day. Single leg lower with bent knees Lie on your back on a firm bed or the floor. Tense your abdominal muscles and lift your feet off the floor, one foot at a time, so your knees and hips are bent in 90-degree angles (right angles). Your knees should be over your hips and your lower legs should be parallel to the floor. Keeping your abdominal muscles tense and your knee bent, slowly lower one of your legs so your toe touches the ground. Lift your leg back up to return to the starting position. Do not hold your breath. Do not let your back arch. Keep  your back flat against the ground. Repeat with your other leg. Repeat __________ times. Complete this exercise __________ times a day. Posture and body mechanics Good posture and healthy body mechanics can help to relieve stress in your body's tissues and joints. Body mechanics refers to the movements and positions of your body while you do your daily activities. Posture is part of body mechanics. Good posture means: Your spine is in its natural S-curve position (neutral). Your shoulders are pulled back slightly. Your head is not tipped forward (neutral). Follow these guidelines to improve your posture and body mechanics in your everyday activities. Standing  When standing, keep your spine neutral and your feet about hip-width apart. Keep a slight bend in your knees. Your ears, shoulders, and hips should line up. When you do a task in which you stand in one place for a long time, place one foot up on a stable object that is 2-4 inches (5-10 cm) high, such as a footstool. This helps keep your spine neutral. Sitting  When sitting, keep your spine neutral and keep your feet flat on the floor. Use a footrest, if necessary, and keep your thighs parallel to the floor. Avoid rounding your shoulders, and avoid tilting your head forward. When working at a desk or a computer, keep your desk at a height where your hands are slightly lower than your elbows.  Slide your chair under your desk so you are close enough to maintain good posture. When working at a computer, place your monitor at a height where you are looking straight ahead and you do not have to tilt your head forward or downward to look at the screen. Resting When lying down and resting, avoid positions that are most painful for you. If you have pain with activities such as sitting, bending, stooping, or squatting, lie in a position in which your body does not bend very much. For example, avoid curling up on your side with your arms and knees near your chest (fetal position). If you have pain with activities such as standing for a long time or reaching with your arms, lie with your spine in a neutral position and bend your knees slightly. Try the following positions: Lying on your side with a pillow between your knees. Lying on your back with a pillow under your knees. Lifting  When lifting objects, keep your feet at least shoulder-width apart and tighten your abdominal muscles. Bend your knees and hips and keep your spine neutral. It is important to lift using the strength of your legs, not your back. Do not lock your knees straight out. Always ask for help to lift heavy or awkward objects. This information is not intended to replace advice given to you by your health care provider. Make sure you discuss any questions you have with your health care provider. Document Revised: 04/13/2023 Document Reviewed: 02/25/2021 Elsevier Patient Education  2024 [employer].

## 2023-08-18 ENCOUNTER — Ambulatory Visit (INDEPENDENT_AMBULATORY_CARE_PROVIDER_SITE_OTHER): Payer: Medicaid Other | Admitting: Obstetrics and Gynecology

## 2023-08-18 ENCOUNTER — Other Ambulatory Visit (HOSPITAL_COMMUNITY)
Admission: RE | Admit: 2023-08-18 | Discharge: 2023-08-18 | Disposition: A | Discharge status: Home / Self Care | Payer: Medicaid Other | Source: Ambulatory Visit | Attending: Obstetrics and Gynecology | Admitting: Obstetrics and Gynecology

## 2023-08-18 ENCOUNTER — Encounter: Payer: Self-pay | Admitting: Obstetrics and Gynecology

## 2023-08-18 VITALS — BP 113/81 | HR 82 | Ht 67.0 in | Wt 195.0 lb

## 2023-08-18 DIAGNOSIS — Z113 Encounter for screening for infections with a predominantly sexual mode of transmission: Secondary | ICD-10-CM | POA: Insufficient documentation

## 2023-08-18 DIAGNOSIS — R102 Pelvic and perineal pain: Secondary | ICD-10-CM | POA: Diagnosis not present

## 2023-08-18 MED ORDER — JUNEL FE 24 1-20 MG-MCG(24) PO TABS: 1.0000 | ORAL_TABLET | Freq: Every day | ORAL | 3 refills | Status: DC

## 2023-08-18 NOTE — Progress Notes (Signed)
   NEW GYNECOLOGY VISIT  Subjective:  Virginia Bradley is a 37 y.o. G0P0000 with LMP 08/14/23 presenting for discussion of dysmenorrhea.   Reports longstanding history of painful lower abdominal/back cramping with her periods. Had previously followed with Dr. Conni Elliot and underwent laparoscopic cystectomy of a 10cm dermoid on 03/06/22. She reports that her pain initially improved a little, but has returned and is getting progressively worse. Reports being told she had fibroids by Dr. Conni Elliot but that she didn't think the fibroids were the cause of her symptoms. She is not sure how large the fibroids were.   Her pain is mostly with her cycle, but she occasionally gets back/pelvic pain unrelated to her cycle. Her pain is alleviated with heating pads and ibuprofen 800mg . She typically misses work on the first 1-2 days of her cycle due to the severity of her pain.   Cycles are regular, last 3-7 days. Seem to be getting longer. Reports heavy bleeding, needs to change pad 5-6 times per day. Passes dime sized clots.   Denies nausea, vomiting, fevers, chills, abnormal vaginal discharge.   Reports trauma history. Sexually active with female partner, does have pain during sex.   Denies any hx VTE, tobacco use, migraine with aura, HTN. No contraindications to estrogen.   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 Conni Elliot 03/06/22 - 10cm dermoid, R ovary/tube surgically absent, otherwise normal pelvic anatomy  Objective:   Vitals:   08/18/23 1522  BP: 113/81  Pulse: 82  Weight: 195 lb (88.5 kg)  Height: 5\' 7"  (1.702 m)    General:  Alert, oriented and cooperative. Patient is in no acute distress.  Skin: Skin is warm and dry. No rash noted.   Cardiovascular: Normal heart rate noted  Respiratory: Normal respiratory effort, no problems with respiration noted  Abdomen: Soft, non-tender, non-distended   Pelvic: NEFG. +levator/obturator tenderness  that reproduces patient's pain. Uterus & cervix nontender, mobile. No palpable adnexal massses.  Exam performed in the presence of a chaperone  Assessment and Plan:  Virginia Bradley is a 37 y.o. with chronic pelvic pain likely due to dysmenorrhea and pelvic floor dysfunction  1. Pelvic pain Most likely diagnosis is dysmenorrhea and pelvic floor dysfunction/myofascial pain based on exam Will sign ROI today for patient's records from [facility] Discussed role for PFPT to address myofascial pain, pt agreeable. Discussed internal component of PFPT Discussed option for medical management with scheduled NSAIDs prior to menstrual period as well as COCs, progestins, patch, ring, Depo, IUD, or Nexplanon for initial management. She would like to try COCs and scheduled NSAIDs. -     Norethindrone Acetate-Ethinyl Estrad-FE (JUNEL FE 24) 1-20 MG-MCG(24) tablet; Take 1 tablet by mouth daily. - Ambulatory referral to Physical Therapy  2. Screening examination for STI - Cervicovaginal ancillary only( [facility]) - HIV antibody (with reflex) - Hepatitis C Antibody - Hepatitis B Surface AntiGEN - RPR  Return in about 3 months (around 11/18/2023) for follow up pelvic pain.  Future Appointments  Date Time Provider Department Center  02/11/2024  3:20 PM Pollyann Savoy, MD [facility] None    Lennart Pall, MD

## 2023-08-18 NOTE — Patient Instructions (Signed)
Try taking ibuprofen 600mg  every 6 hours or 800mg  every 8 hours for the first 2 days BEFORE your period and the first 2 days of your period.

## 2023-08-18 NOTE — Progress Notes (Signed)
Pt is new to office to est care.   Pt states she had surgery last year for ovarian cyst- was having sig. Cramping. Pt states cramping is now back.  Surgery was with [facility]- Dr Conni Elliot.  Pt also told she has fibroids.

## 2023-08-19 LAB — HEPATITIS C ANTIBODY: Hep C Virus Ab: NONREACTIVE

## 2023-08-19 LAB — RPR: RPR Ser Ql: NONREACTIVE

## 2023-08-19 LAB — HEPATITIS B SURFACE ANTIGEN: Hepatitis B Surface Ag: NEGATIVE

## 2023-08-19 LAB — HIV ANTIBODY (ROUTINE TESTING W REFLEX): HIV Screen 4th Generation wRfx: NONREACTIVE

## 2023-08-23 DIAGNOSIS — Z419 Encounter for procedure for purposes other than remedying health state, unspecified: Secondary | ICD-10-CM | POA: Diagnosis not present

## 2023-08-27 LAB — CERVICOVAGINAL ANCILLARY ONLY
Chlamydia: NEGATIVE
Comment: NEGATIVE
Comment: NEGATIVE
Comment: NORMAL
Neisseria Gonorrhea: NEGATIVE
Trichomonas: NEGATIVE

## 2023-09-22 DIAGNOSIS — Z419 Encounter for procedure for purposes other than remedying health state, unspecified: Secondary | ICD-10-CM | POA: Diagnosis not present

## 2023-10-23 DIAGNOSIS — Z419 Encounter for procedure for purposes other than remedying health state, unspecified: Secondary | ICD-10-CM | POA: Diagnosis not present
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
## 2023-11-12 ENCOUNTER — Encounter: Payer: Self-pay | Admitting: Physical Therapy

## 2023-11-12 ENCOUNTER — Other Ambulatory Visit: Payer: Self-pay

## 2023-11-12 ENCOUNTER — Encounter: Payer: Medicaid Other | Attending: Obstetrics and Gynecology | Admitting: Physical Therapy

## 2023-11-12 DIAGNOSIS — R252 Cramp and spasm: Secondary | ICD-10-CM | POA: Insufficient documentation

## 2023-11-12 DIAGNOSIS — R102 Pelvic and perineal pain: Secondary | ICD-10-CM | POA: Insufficient documentation

## 2023-11-15 DIAGNOSIS — J189 Pneumonia, unspecified organism: Secondary | ICD-10-CM | POA: Diagnosis not present

## 2023-11-15 DIAGNOSIS — Z20822 Contact with and (suspected) exposure to covid-19: Secondary | ICD-10-CM | POA: Diagnosis not present

## 2023-11-15 DIAGNOSIS — R062 Wheezing: Secondary | ICD-10-CM | POA: Diagnosis not present

## 2023-11-17 ENCOUNTER — Encounter: Payer: Medicaid Other | Admitting: Physical Therapy

## 2023-11-22 DIAGNOSIS — Z419 Encounter for procedure for purposes other than remedying health state, unspecified: Secondary | ICD-10-CM | POA: Diagnosis not present

## 2023-11-26 ENCOUNTER — Encounter: Payer: Medicaid Other | Admitting: Physical Therapy

## 2023-12-03 ENCOUNTER — Encounter: Payer: Medicaid Other | Admitting: Physical Therapy

## 2023-12-10 ENCOUNTER — Encounter: Payer: Self-pay | Admitting: Physical Therapy

## 2023-12-10 ENCOUNTER — Encounter: Payer: Medicaid Other | Attending: Obstetrics and Gynecology | Admitting: Physical Therapy

## 2023-12-10 DIAGNOSIS — R252 Cramp and spasm: Secondary | ICD-10-CM | POA: Diagnosis present

## 2023-12-10 DIAGNOSIS — R102 Pelvic and perineal pain: Secondary | ICD-10-CM | POA: Insufficient documentation

## 2023-12-10 DIAGNOSIS — M5459 Other low back pain: Secondary | ICD-10-CM | POA: Insufficient documentation

## 2023-12-10 DIAGNOSIS — M6281 Muscle weakness (generalized): Secondary | ICD-10-CM | POA: Insufficient documentation
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
## 2023-12-23 DIAGNOSIS — Z419 Encounter for procedure for purposes other than remedying health state, unspecified: Secondary | ICD-10-CM | POA: Diagnosis not present

## 2023-12-29 ENCOUNTER — Encounter: Payer: Self-pay | Admitting: Physical Therapy

## 2024-01-14 ENCOUNTER — Encounter: Payer: Medicaid Other | Admitting: Physical Therapy

## 2024-01-21 ENCOUNTER — Encounter: Payer: Medicaid Other | Admitting: Physical Therapy

## 2024-01-23 DIAGNOSIS — Z419 Encounter for procedure for purposes other than remedying health state, unspecified: Secondary | ICD-10-CM | POA: Diagnosis not present

## 2024-01-28 ENCOUNTER — Encounter: Payer: Self-pay | Admitting: Physical Therapy

## 2024-01-28 ENCOUNTER — Encounter: Payer: Medicaid Other | Attending: Obstetrics and Gynecology | Admitting: Physical Therapy

## 2024-01-28 DIAGNOSIS — R102 Pelvic and perineal pain: Secondary | ICD-10-CM | POA: Insufficient documentation

## 2024-01-28 DIAGNOSIS — M5459 Other low back pain: Secondary | ICD-10-CM | POA: Diagnosis not present

## 2024-01-28 DIAGNOSIS — M6281 Muscle weakness (generalized): Secondary | ICD-10-CM | POA: Insufficient documentation

## 2024-01-28 DIAGNOSIS — R252 Cramp and spasm: Secondary | ICD-10-CM | POA: Insufficient documentation
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
## 2024-01-28 NOTE — Progress Notes (Deleted)
 Office Visit Note  Patient: Virginia Bradley             Date of Birth: 1986-07-11           MRN: [id number]             PCP: Renaye Rakers, MD Referring: Renaye Rakers, MD Visit Date: 02/11/2024 Occupation: @GUAROCC @  Subjective:  No chief complaint on file.   History of Present Illness: Virginia Bradley is a 38 y.o. female ***     Activities of Daily Living:  Patient reports morning stiffness for *** {minute/hour:19697}.   Patient {ACTIONS;DENIES/REPORTS:21021675::"Denies"} nocturnal pain.  Difficulty dressing/grooming: {ACTIONS;DENIES/REPORTS:21021675::"Denies"} Difficulty climbing stairs: {ACTIONS;DENIES/REPORTS:21021675::"Denies"} Difficulty getting out of chair: {ACTIONS;DENIES/REPORTS:21021675::"Denies"} Difficulty using hands for taps, buttons, cutlery, and/or writing: {ACTIONS;DENIES/REPORTS:21021675::"Denies"}  No Rheumatology ROS completed.   PMFS History:  Patient Active Problem List   Diagnosis Date Noted   Recurrent major depressive disorder, in partial remission (HCC) 05/19/2019   Localization-related idiopathic epilepsy and epileptic syndromes with seizures of localized onset, not intractable, without status epilepticus (HCC) 07/20/2018   Seizure disorder (HCC) 03/24/2016   Migraine 03/24/2016    Past Medical History:  Diagnosis Date   Anxiety    Complication of anesthesia    Depression    Family history of adverse reaction to anesthesia    GERD (gastroesophageal reflux disease)    History of COVID-19    per pt mild symptoms that resolved 03/ 2022  and 09/ 2022   Ovarian cyst, left    Seizure disorder (HCC)    (03-03-2022   pt stated last seizure seizure approxl 2020/ 2021, none since , no medications) seen 3 different neurologist documented in epic, first seen by dr Merrily Brittle 03-24-2016;  dr Lamount Cranker 06-01-2019;   dr e. feraru ([facility] in care [facility] [place] 10-05-2019;;  febrile seizures as infant then started 2001 as teen;   hx negative EEG's in  epic    Family History  Problem Relation Age of Onset   Seizures Mother    Other Father        smoldering myeloma   Hypertension Father    Crohn's disease Brother    Hypertension Maternal Grandmother    Past Surgical History:  Procedure Laterality Date   APPENDECTOMY  2002   LAPAROSCOPIC OOPHORECTOMY Right 2007   per pt  thinks it was right ovary removed   ROBOTIC ASSISTED LAPAROSCOPIC OVARIAN CYSTECTOMY Left 03/06/2022   Procedure: XI ROBOTIC ASSISTED LAPAROSCOPIC OVARIAN CYSTECTOMY;  Surgeon: Toy Baker, DO;  Location: [facility];  Service: Gynecology;  Laterality: Left;   Social History   Social History Narrative   Pt lives alone in 1 story home, on the second floor   Has associates degree   Works in daycare facility    There is no immunization history on file for this patient.   Objective: Vital Signs: There were no vitals taken for this visit.   Physical Exam   Musculoskeletal Exam: ***  CDAI Exam: CDAI Score: -- Patient Global: --; Provider Global: -- Swollen: --; Tender: -- Joint Exam 02/11/2024   No joint exam has been documented for this visit   There is currently no information documented on the homunculus. Go to the Rheumatology activity and complete the homunculus joint exam.  Investigation: No additional findings.  Imaging: No results found.  Recent Labs: Lab Results  Component Value Date   WBC 10.5 09/08/2022   HGB 14.0 09/08/2022   PLT 445 (H) 09/08/2022   NA 138  09/08/2022   K 3.7 09/08/2022   CL 107 09/08/2022   CO2 22 09/08/2022   GLUCOSE 99 09/08/2022   BUN 10 09/08/2022   CREATININE 0.73 09/08/2022   BILITOT 0.1 (L) 09/08/2022   ALKPHOS 79 09/08/2022   AST 12 (L) 09/08/2022   ALT 14 09/08/2022   PROT 6.6 09/08/2022   ALBUMIN 3.9 09/08/2022   CALCIUM 9.3 09/08/2022   GFRAA >60 01/28/2019    Speciality Comments: No specialty comments available.  Procedures:  No procedures performed Allergies:  Depakote er [divalproex sodium er], Valproic acid, and Dilantin [phenytoin sodium extended]   Assessment / Plan:     Visit Diagnoses: No diagnosis found.  Orders: No orders of the defined types were placed in this encounter.  No orders of the defined types were placed in this encounter.   Face-to-face time spent with patient was *** minutes. Greater than 50% of time was spent in counseling and coordination of care.  Follow-Up Instructions: No follow-ups on file.   Ellen Henri, CMA  Note - This record has been created using Animal nutritionist.  Chart creation errors have been sought, but may not always  have been located. Such creation errors do not reflect on  the standard of medical care.

## 2024-02-08 ENCOUNTER — Ambulatory Visit: Payer: Medicaid Other | Admitting: Physical Therapy

## 2024-02-08 ENCOUNTER — Encounter: Payer: Self-pay | Admitting: Physical Therapy

## 2024-02-08 DIAGNOSIS — M5459 Other low back pain: Secondary | ICD-10-CM | POA: Diagnosis not present

## 2024-02-08 DIAGNOSIS — R252 Cramp and spasm: Secondary | ICD-10-CM | POA: Diagnosis not present

## 2024-02-08 DIAGNOSIS — R102 Pelvic and perineal pain: Secondary | ICD-10-CM | POA: Diagnosis not present

## 2024-02-08 DIAGNOSIS — M6281 Muscle weakness (generalized): Secondary | ICD-10-CM | POA: Diagnosis not present

## 2024-02-08 NOTE — Patient Instructions (Signed)
 About Abdominal Massage  Abdominal massage, also called external colon massage, is a self-treatment circular massage technique that can reduce and eliminate gas and ease constipation. The colon naturally contracts in waves in a clockwise direction starting from inside the right hip, moving up toward the ribs, across the belly, and down inside the left hip.  When you perform circular abdominal massage, you help stimulate your colon's normal wave pattern of movement called peristalsis.  It is most beneficial when done after eating.  Positioning You can practice abdominal massage with oil while lying down, or in the shower with soap.  Some people find that it is just as effective to do the massage through clothing while sitting or standing.  How to Massage Start by placing your finger tips or knuckles on your right side, just inside your hip bone.  Make small circular movements while you move upward toward your rib cage.   Once you reach the bottom right side of your rib cage, take your circular movements across to the left side of the bottom of your rib cage.  Next, move downward until you reach the inside of your left hip bone.  This is the path your feces travel in your colon. Continue to perform your abdominal massage in this pattern for 10 minutes each day.     You can apply as much pressure as is comfortable in your massage.  Start gently and build pressure as you continue to practice.  Notice any areas of pain as you massage; areas of slight pain may be relieved as you massage, but if you have areas of significant or intense pain, consult with your healthcare provider.  Other Considerations General physical activity including bending and stretching can have a beneficial massage-like effect on the colon.  Deep breathing can also stimulate the colon because breathing deeply activates the same nervous system that supplies the colon.   Abdominal massage should always be used in combination with a  bowel-conscious diet that is high in the proper type of fiber for you, fluids (primarily water), and a regular exercise program.  [facility] [address] [place], [place] [address] Phone # [phone] Fax [phone]
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
## 2024-02-11 ENCOUNTER — Ambulatory Visit: Payer: Medicaid Other | Admitting: Rheumatology

## 2024-02-11 DIAGNOSIS — F5101 Primary insomnia: Secondary | ICD-10-CM

## 2024-02-11 DIAGNOSIS — M62838 Other muscle spasm: Secondary | ICD-10-CM

## 2024-02-11 DIAGNOSIS — G40009 Localization-related (focal) (partial) idiopathic epilepsy and epileptic syndromes with seizures of localized onset, not intractable, without status epilepticus: Secondary | ICD-10-CM

## 2024-02-11 DIAGNOSIS — D75839 Thrombocytosis, unspecified: Secondary | ICD-10-CM

## 2024-02-11 DIAGNOSIS — M797 Fibromyalgia: Secondary | ICD-10-CM

## 2024-02-11 DIAGNOSIS — M79641 Pain in right hand: Secondary | ICD-10-CM

## 2024-02-11 DIAGNOSIS — R5383 Other fatigue: Secondary | ICD-10-CM

## 2024-02-11 DIAGNOSIS — L0202 Furuncle of face: Secondary | ICD-10-CM | POA: Diagnosis not present

## 2024-02-11 DIAGNOSIS — D279 Benign neoplasm of unspecified ovary: Secondary | ICD-10-CM

## 2024-02-11 DIAGNOSIS — E559 Vitamin D deficiency, unspecified: Secondary | ICD-10-CM

## 2024-02-11 DIAGNOSIS — M47816 Spondylosis without myelopathy or radiculopathy, lumbar region: Secondary | ICD-10-CM

## 2024-02-11 DIAGNOSIS — M5030 Other cervical disc degeneration, unspecified cervical region: Secondary | ICD-10-CM

## 2024-02-11 DIAGNOSIS — F419 Anxiety disorder, unspecified: Secondary | ICD-10-CM

## 2024-02-11 DIAGNOSIS — Z8669 Personal history of other diseases of the nervous system and sense organs: Secondary | ICD-10-CM

## 2024-02-11 DIAGNOSIS — Z8379 Family history of other diseases of the digestive system: Secondary | ICD-10-CM

## 2024-02-15 DIAGNOSIS — L0202 Furuncle of face: Secondary | ICD-10-CM | POA: Diagnosis not present

## 2024-02-15 DIAGNOSIS — R599 Enlarged lymph nodes, unspecified: Secondary | ICD-10-CM | POA: Diagnosis not present

## 2024-02-16 NOTE — Progress Notes (Signed)
 Office Visit Note  Patient: Virginia Bradley             Date of Birth: 1986/04/04           MRN: [id number]             PCP: Renaye Rakers, MD Referring: Renaye Rakers, MD Visit Date: 03/01/2024 Occupation: @GUAROCC @  Subjective:  Trapezius muscle spasms and tension   History of Present Illness: Virginia Bradley is a 38 y.o. female with history of DDD and fibromyalgia.  Patient presents today a recurrence of trapezius muscle tension and tenderness bilaterally.  She had a trapezius trigger point injections for which provided significant relief but her symptoms have recurred.  She requested repeat trigger point injections today.  Patient reports that she recently had a flare in her lower back.  She was experiencing muscle spasms and was evaluated urgent care.  Patient reports that she was prescribed cyclobenzaprine and diclofenac which she took for 10 days.  Patient states that her lower back pain has subsided.  She is not currently experiencing any muscle spasms.  Patient states that she takes exercising Tylenol as needed for pain relief. She denies any joint swelling at this time.  She continues to have chronic fatigue.  She denies any other new or worsening symptoms since her last office visit.  Activities of Daily Living:  Patient reports morning stiffness for 15-20 minutes.   Patient Reports nocturnal pain.  Difficulty dressing/grooming: Reports Difficulty climbing stairs: Denies Difficulty getting out of chair: Denies Difficulty using hands for taps, buttons, cutlery, and/or writing: Denies  Review of Systems  Constitutional:  Positive for fatigue.  HENT:  Negative for mouth sores, mouth dryness and nose dryness.   Eyes:  Negative for pain and dryness.  Respiratory:  Negative for shortness of breath and difficulty breathing.   Cardiovascular:  Positive for palpitations. Negative for chest pain.  Gastrointestinal:  Negative for blood in stool, constipation and diarrhea.  Endocrine:  Negative for increased urination.  Genitourinary:  Negative for involuntary urination.  Musculoskeletal:  Positive for joint pain, joint pain, myalgias, muscle weakness, morning stiffness, muscle tenderness and myalgias. Negative for gait problem and joint swelling.  Skin:  Negative for color change, rash, hair loss and sensitivity to sunlight.  Allergic/Immunologic: Negative for susceptible to infections.  Neurological:  Negative for dizziness and headaches.  Hematological:  Positive for swollen glands.  Psychiatric/Behavioral:  Negative for depressed mood and sleep disturbance. The patient is not nervous/anxious.     PMFS History:  Patient Active Problem List   Diagnosis Date Noted   Recurrent major depressive disorder, in partial remission (HCC) 05/19/2019   Localization-related idiopathic epilepsy and epileptic syndromes with seizures of localized onset, not intractable, without status epilepticus (HCC) 07/20/2018   Seizure disorder (HCC) 03/24/2016   Migraine 03/24/2016    Past Medical History:  Diagnosis Date   Anxiety    Complication of anesthesia    Depression    Family history of adverse reaction to anesthesia    GERD (gastroesophageal reflux disease)    History of COVID-19    per pt mild symptoms that resolved 03/ 2022  and 09/ 2022   Ovarian cyst, left    Seizure disorder (HCC)    (03-03-2022   pt stated last seizure seizure approxl 2020/ 2021, none since , no medications) seen 3 different neurologist documented in epic, first seen by dr Merrily Brittle 03-24-2016;  dr Lamount Cranker 06-01-2019;   dr e. feraru ([facility]) in care  everywhere lov 10-05-2019;;  febrile seizures as infant then started 2001 as teen;   hx negative EEG's in epic    Family History  Problem Relation Age of Onset   Seizures Mother    Other Father        smoldering myeloma   Hypertension Father    Crohn's disease Brother    Hypertension Maternal Grandmother    Past Surgical History:  Procedure  Laterality Date   APPENDECTOMY  2002   LAPAROSCOPIC OOPHORECTOMY Right 2007   per pt  thinks it was right ovary removed   ROBOTIC ASSISTED LAPAROSCOPIC OVARIAN CYSTECTOMY Left 03/06/2022   Procedure: XI ROBOTIC ASSISTED LAPAROSCOPIC OVARIAN CYSTECTOMY;  Surgeon: Toy Baker, DO;  Location: [facility];  Service: Gynecology;  Laterality: Left;   Social History   Social History Narrative   Pt lives alone in 1 story home, on the second floor   Has associates degree   Works in daycare facility   Immunization History  Administered Date(s) Administered   PFIZER(Purple Top)SARS-COV-2 Vaccination 08/29/2020, 09/19/2020     Objective: Vital Signs: BP 106/72 (BP Location: Left Arm, Patient Position: Sitting, Cuff Size: Normal)   Pulse 78   Resp 14   Ht 5\' 7"  (1.702 m)   Wt 200 lb 6.4 oz (90.9 kg)   BMI 31.39 kg/m    Physical Exam Vitals and nursing note reviewed.  Constitutional:      Appearance: She is well-developed.  HENT:     Head: Normocephalic and atraumatic.  Eyes:     Conjunctiva/sclera: Conjunctivae normal.  Cardiovascular:     Rate and Rhythm: Normal rate and regular rhythm.     Heart sounds: Normal heart sounds.  Pulmonary:     Effort: Pulmonary effort is normal.     Breath sounds: Normal breath sounds.  Abdominal:     General: Bowel sounds are normal.     Palpations: Abdomen is soft.  Musculoskeletal:     Cervical back: Normal range of motion.  Lymphadenopathy:     Cervical: No cervical adenopathy.  Skin:    General: Skin is warm and dry.     Capillary Refill: Capillary refill takes less than 2 seconds.  Neurological:     Mental Status: She is alert and oriented to person, place, and time.  Psychiatric:        Behavior: Behavior normal.      Musculoskeletal Exam: C-spine has good flexion and extension.  Slightly limited lateral rotation particularly to the left.  Shoulder joints, elbow joints, wrist joints, MCPs, PIPs, DIPs have good  range of motion with no synovitis.  Complete fist formation bilaterally.  Hip joints have good range of motion with no groin pain.  Knee joints have good range of motion no warmth or effusion.  Ankle joints have good range of motion with no tenderness or joint swelling.  CDAI Exam: CDAI Score: -- Patient Global: --; Provider Global: -- Swollen: --; Tender: -- Joint Exam 03/01/2024   No joint exam has been documented for this visit   There is currently no information documented on the homunculus. Go to the Rheumatology activity and complete the homunculus joint exam.  Investigation: No additional findings.  Imaging: No results found.  Recent Labs: Lab Results  Component Value Date   WBC 10.5 09/08/2022   HGB 14.0 09/08/2022   PLT 445 (H) 09/08/2022   NA 138 09/08/2022   K 3.7 09/08/2022   CL 107 09/08/2022   CO2 22 09/08/2022  GLUCOSE 99 09/08/2022   BUN 10 09/08/2022   CREATININE 0.73 09/08/2022   BILITOT 0.1 (L) 09/08/2022   ALKPHOS 79 09/08/2022   AST 12 (L) 09/08/2022   ALT 14 09/08/2022   PROT 6.6 09/08/2022   ALBUMIN 3.9 09/08/2022   CALCIUM 9.3 09/08/2022   GFRAA >60 01/28/2019    Speciality Comments: No specialty comments available.  Procedures:  Trigger Point Inj  Date/Time: 03/01/2024 10:17 AM  Performed by: Gearldine Bienenstock, PA-C Authorized by: Gearldine Bienenstock, PA-C   Consent Given by:  Patient Site marked: the procedure site was marked   Timeout: prior to procedure the correct patient, procedure, and site was verified   Indications:  Pain Total # of Trigger Points:  2 Location: neck   Needle Size:  27 G Approach:  Dorsal Medications #1:  0.5 mL lidocaine 1 %; 10 mg triamcinolone acetonide 40 MG/ML Medications #2:  0.5 mL lidocaine 1 %; 10 mg triamcinolone acetonide 40 MG/ML Patient tolerance:  Patient tolerated the procedure well with no immediate complications  Allergies: Depakote er [divalproex sodium er], Valproic acid, and Dilantin [phenytoin  sodium extended]   Assessment / Plan:     Visit Diagnoses: Fibromyalgia: Patient experiences intermittent myalgias and muscle tenderness due to fibromyalgia.  Patient has had a recurrence of trapezius muscle tension and tenderness bilaterally.  Bilateral trapezius trigger point injections were performed today.  Discussed the importance of regular exercise and good sleep hygiene.  She will follow-up in the office in 6 months or sooner if needed.  Trapezius muscle spasm - Patient today with trapezius muscle tension and tenderness bilaterally.  Patient had trapezius trigger point injections performed on 08/11/2023 which provided significant relief but her symptoms have recurred.  She requested repeat trigger point injections today.  She tolerated procedures well.  Procedure notes were completed above.  Aftercare was discussed.  Plan: Trigger Point Inj  Primary insomnia: No longer taking melatonin.  Other fatigue: Chronic, stable.   Positive ANA (antinuclear antibody) - 01/14/23: ANA 1:40 nuclear fine speckled, ESR WNL, CK WNL, TSH WNL, RF-, Anti-CCP negative, vitamin D low-12.  She has no clinical features of systemic lupus at this time.  No known family history of systemic lupus.  Plan to obtain the following lab work today for further evaluation.- Plan: VITAMIN D 25 Hydroxy (Vit-D Deficiency, Fractures), ANA, C3 and C4, Sedimentation rate, RNP Antibody, Anti-Smith antibody, Sjogrens syndrome-B extractable nuclear antibody, Sjogrens syndrome-A extractable nuclear antibody  DDD (degenerative disc disease), cervical - X-rays showed mild spondylosis and facet joint arthropathy.  Most prominent narrowing was noted between C5-6 and C6-C7. She completed physical therapy.  Patient has limited range of motion with lateral rotation.  Patient had trapezius muscle tension and tenderness bilaterally and has requested repeat trigger point injections today.  She tolerated the procedures well.  Procedure notes were  completed above.  Aftercare was discussed.  Arthropathy of lumbar facet joint: She experiences intermittent discomfort in her lower back.  Patient recently had an episode of muscle spasms in her lower back.  She was evaluated urgent care and was prescribed cyclobenzaprine and oral diclofenac for symptomatic relief.  She has completed the 10-day course of diclofenac and is no longer taking cyclobenzaprine.  Pain in right hand: No tenderness or synovitis noted.   Vitamin D deficiency - Vitamin D low-12 on 01/14/23.  Vitamin D will be rechecked today.  Plan: VITAMIN D 25 Hydroxy (Vit-D Deficiency, Fractures)  Other medical conditions are listed as follows:  Anxiety  and depression  Localization-related idiopathic epilepsy and epileptic syndromes with seizures of localized onset, not intractable, without status epilepticus (HCC)  Thrombocytosis: Platelet count was 461K on 02/15/2024.  Hx of migraines  Benign teratoma of ovary, unspecified laterality  Family history of Crohn's disease-brother  Recurrent major depressive disorder, in partial remission (HCC)  History of anxiety  Orders: Orders Placed This Encounter  Procedures   Trigger Point Inj   VITAMIN D 25 Hydroxy (Vit-D Deficiency, Fractures)   ANA   C3 and C4   Sedimentation rate   RNP Antibody   Anti-Smith antibody   Sjogrens syndrome-B extractable nuclear antibody   Sjogrens syndrome-A extractable nuclear antibody   No orders of the defined types were placed in this encounter.     Follow-Up Instructions: Return in about 6 months (around 09/01/2024) for Fibromyalgia, DDD.   Gearldine Bienenstock, PA-C  Note - This record has been created using Dragon software.  Chart creation errors have been sought, but may not always  have been located. Such creation errors do not reflect on  the standard of medical care.

## 2024-02-18 DIAGNOSIS — M47816 Spondylosis without myelopathy or radiculopathy, lumbar region: Secondary | ICD-10-CM | POA: Diagnosis not present

## 2024-02-18 DIAGNOSIS — M545 Low back pain, unspecified: Secondary | ICD-10-CM | POA: Diagnosis not present

## 2024-02-20 DIAGNOSIS — Z419 Encounter for procedure for purposes other than remedying health state, unspecified: Secondary | ICD-10-CM | POA: Diagnosis not present

## 2024-03-01 ENCOUNTER — Ambulatory Visit: Payer: Medicaid Other | Attending: Physician Assistant | Admitting: Physician Assistant

## 2024-03-01 ENCOUNTER — Encounter: Payer: Self-pay | Admitting: Physician Assistant

## 2024-03-01 ENCOUNTER — Other Ambulatory Visit: Payer: Self-pay | Admitting: *Deleted

## 2024-03-01 VITALS — BP 106/72 | HR 78 | Resp 14 | Ht 67.0 in | Wt 200.4 lb

## 2024-03-01 DIAGNOSIS — M79641 Pain in right hand: Secondary | ICD-10-CM | POA: Diagnosis not present

## 2024-03-01 DIAGNOSIS — R5383 Other fatigue: Secondary | ICD-10-CM

## 2024-03-01 DIAGNOSIS — M47816 Spondylosis without myelopathy or radiculopathy, lumbar region: Secondary | ICD-10-CM | POA: Diagnosis not present

## 2024-03-01 DIAGNOSIS — Z8659 Personal history of other mental and behavioral disorders: Secondary | ICD-10-CM

## 2024-03-01 DIAGNOSIS — M62838 Other muscle spasm: Secondary | ICD-10-CM

## 2024-03-01 DIAGNOSIS — M5030 Other cervical disc degeneration, unspecified cervical region: Secondary | ICD-10-CM

## 2024-03-01 DIAGNOSIS — E559 Vitamin D deficiency, unspecified: Secondary | ICD-10-CM

## 2024-03-01 DIAGNOSIS — R7689 Other specified abnormal immunological findings in serum: Secondary | ICD-10-CM

## 2024-03-01 DIAGNOSIS — R768 Other specified abnormal immunological findings in serum: Secondary | ICD-10-CM | POA: Diagnosis not present

## 2024-03-01 DIAGNOSIS — D279 Benign neoplasm of unspecified ovary: Secondary | ICD-10-CM

## 2024-03-01 DIAGNOSIS — M797 Fibromyalgia: Secondary | ICD-10-CM | POA: Diagnosis not present

## 2024-03-01 DIAGNOSIS — G40009 Localization-related (focal) (partial) idiopathic epilepsy and epileptic syndromes with seizures of localized onset, not intractable, without status epilepticus: Secondary | ICD-10-CM

## 2024-03-01 DIAGNOSIS — F5101 Primary insomnia: Secondary | ICD-10-CM | POA: Diagnosis not present

## 2024-03-01 DIAGNOSIS — F419 Anxiety disorder, unspecified: Secondary | ICD-10-CM

## 2024-03-01 DIAGNOSIS — Z8379 Family history of other diseases of the digestive system: Secondary | ICD-10-CM

## 2024-03-01 DIAGNOSIS — Z8669 Personal history of other diseases of the nervous system and sense organs: Secondary | ICD-10-CM

## 2024-03-01 DIAGNOSIS — D75839 Thrombocytosis, unspecified: Secondary | ICD-10-CM | POA: Diagnosis not present

## 2024-03-01 DIAGNOSIS — F3341 Major depressive disorder, recurrent, in partial remission: Secondary | ICD-10-CM

## 2024-03-01 MED ORDER — LIDOCAINE HCL 1 % IJ SOLN
0.5000 mL | INTRAMUSCULAR | Status: AC | PRN
Start: 2024-03-01 — End: 2024-03-01
  Administered 2024-03-01: .5 mL

## 2024-03-01 MED ORDER — VITAMIN D (ERGOCALCIFEROL) 1.25 MG (50000 UNIT) PO CAPS: 50000.0000 [IU] | ORAL_CAPSULE | ORAL | 0 refills | Status: AC

## 2024-03-01 MED ORDER — TRIAMCINOLONE ACETONIDE 40 MG/ML IJ SUSP
10.0000 mg | INTRAMUSCULAR | Status: AC | PRN
  Administered 2024-03-01: 10 mg via INTRAMUSCULAR

## 2024-03-01 MED ORDER — LIDOCAINE HCL 1 % IJ SOLN
0.5000 mL | INTRAMUSCULAR | Status: AC | PRN
  Administered 2024-03-01: .5 mL

## 2024-03-01 NOTE — Telephone Encounter (Signed)
-----   Message from Gearldine Bienenstock sent at 03/01/2024  3:37 PM EDT ----- Vitamin D remains low but has improved-24. Recommend taking vitamin D 50,000 units once weekly x3 months. Recheck vitamin D in 3 months.

## 2024-03-01 NOTE — Progress Notes (Signed)
 Vitamin D remains low but has improved-24. Recommend taking vitamin D 50,000 units once weekly x3 months. Recheck vitamin D in 3 months.

## 2024-03-02 LAB — C3 AND C4
C3 Complement: 145 mg/dL (ref 83–193)
C4 Complement: 30 mg/dL (ref 15–57)

## 2024-03-02 LAB — SJOGRENS SYNDROME-A EXTRACTABLE NUCLEAR ANTIBODY: SSA (Ro) (ENA) Antibody, IgG: <1 AI

## 2024-03-02 LAB — SJOGRENS SYNDROME-B EXTRACTABLE NUCLEAR ANTIBODY: SSB (La) (ENA) Antibody, IgG: <1 AI

## 2024-03-02 LAB — SEDIMENTATION RATE: Sed Rate: 9 mm/h (ref 0–20)

## 2024-03-02 LAB — RNP ANTIBODY: Ribonucleic Protein(ENA) Antibody, IgG: <1 AI

## 2024-03-02 LAB — ANTI-SMITH ANTIBODY: ENA SM Ab Ser-aCnc: <1 AI

## 2024-03-02 LAB — ANA: Anti Nuclear Antibody (ANA): NEGATIVE

## 2024-03-02 LAB — VITAMIN D 25 HYDROXY (VIT D DEFICIENCY, FRACTURES): Vit D, 25-Hydroxy: 24 ng/mL — ABNORMAL LOW (ref 30–100)

## 2024-03-02 NOTE — Progress Notes (Signed)
Complements WNL

## 2024-03-02 NOTE — Progress Notes (Signed)
 ESR WNL

## 2024-03-02 NOTE — Progress Notes (Signed)
 RNP negative.  [place] antibodies negative

## 2024-03-03 NOTE — Progress Notes (Signed)
 ANA negative  Smith antibody negative

## 2024-03-16 ENCOUNTER — Ambulatory Visit: Payer: Self-pay | Admitting: Physical Therapy

## 2024-03-23 ENCOUNTER — Ambulatory Visit: Attending: Obstetrics and Gynecology | Admitting: Physical Therapy

## 2024-03-23 ENCOUNTER — Encounter: Payer: Self-pay | Admitting: Physical Therapy

## 2024-03-23 DIAGNOSIS — M5459 Other low back pain: Secondary | ICD-10-CM | POA: Diagnosis not present

## 2024-03-23 DIAGNOSIS — R102 Pelvic and perineal pain: Secondary | ICD-10-CM | POA: Diagnosis not present

## 2024-03-23 DIAGNOSIS — R252 Cramp and spasm: Secondary | ICD-10-CM | POA: Insufficient documentation

## 2024-03-23 DIAGNOSIS — M6281 Muscle weakness (generalized): Secondary | ICD-10-CM | POA: Diagnosis not present
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
## 2024-04-02 DIAGNOSIS — Z419 Encounter for procedure for purposes other than remedying health state, unspecified: Secondary | ICD-10-CM | POA: Diagnosis not present

## 2024-04-11 ENCOUNTER — Ambulatory Visit: Payer: Self-pay | Admitting: Physical Therapy

## 2024-04-11 ENCOUNTER — Telehealth: Payer: Self-pay | Admitting: Physical Therapy

## 2024-04-11 NOTE — Telephone Encounter (Signed)
 Called patient about her missed appt. Today at 11:00. Left a message.  Marsha Skeen, PT @4 /21/25@ 11:50 AM

## 2024-05-02 DIAGNOSIS — H5213 Myopia, bilateral: Secondary | ICD-10-CM | POA: Diagnosis not present

## 2024-05-02 DIAGNOSIS — Z419 Encounter for procedure for purposes other than remedying health state, unspecified: Secondary | ICD-10-CM | POA: Diagnosis not present

## 2024-05-27 DIAGNOSIS — H5213 Myopia, bilateral: Secondary | ICD-10-CM | POA: Diagnosis not present

## 2024-06-02 DIAGNOSIS — Z419 Encounter for procedure for purposes other than remedying health state, unspecified: Secondary | ICD-10-CM | POA: Diagnosis not present

## 2024-07-01 ENCOUNTER — Telehealth: Payer: Self-pay | Admitting: Rheumatology

## 2024-07-01 NOTE — Telephone Encounter (Signed)
 Pt called requesting is she could get a Cortizone shot in both shoulders. Pt stated she's received them before.

## 2024-07-01 NOTE — Telephone Encounter (Signed)
 Reached out to patient to schedule an appointment on 07/05/2024 at 1:10 pm.

## 2024-07-01 NOTE — Progress Notes (Unsigned)
 Office Visit Note  Patient: Virginia Bradley             Date of Birth: [birth year]           MRN: [id number]             PCP: Benjamine Aland, MD Referring: Benjamine Aland, MD Visit Date: 07/05/2024 Occupation: @GUAROCC @  Subjective:  Trapezius muscle tension and tenderness                         History of Present Illness: Virginia Bradley is a 38 y.o. female with history of fibromyalgia and DDD.  She continues to experience intermittent myalgias and muscle tenderness due to fibromyalgia.  Her primary concern remains trapezius muscle tension and tenderness bilaterally.   She had trigger point injections performed bilaterally on 03/01/24 which provided significant pain relief.  Her symptoms recently recurred so she has requested repeat trigger point injections today.  She is using a heating pad as needed which has been helpful.  She has not been using any pain patches recently.  She will be traveling to [place] on Thursday.  She takes dual action Tylenol and ibuprofen  as needed for pain relief.    Activities of Daily Living:  Patient reports morning stiffness for 5-10 minutes.   Patient Reports nocturnal pain.  Difficulty dressing/grooming: Reports Difficulty climbing stairs: Denies Difficulty getting out of chair: Denies Difficulty using hands for taps, buttons, cutlery, and/or writing: Denies  Review of Systems  Constitutional:  Positive for fatigue.  HENT:  Negative for mouth sores and mouth dryness.   Eyes:  Negative for dryness.  Respiratory:  Negative for shortness of breath.   Cardiovascular:  Negative for chest pain and palpitations.  Gastrointestinal:  Negative for blood in stool, constipation and diarrhea.  Endocrine: Negative for increased urination.  Genitourinary:  Negative for involuntary urination.  Musculoskeletal:  Positive for joint pain, joint pain, myalgias, morning stiffness and myalgias. Negative for gait problem, joint swelling, muscle weakness and muscle tenderness.   Skin:  Negative for color change, rash, hair loss and sensitivity to sunlight.  Allergic/Immunologic: Negative for susceptible to infections.  Neurological:  Negative for dizziness and headaches.  Hematological:  Negative for swollen glands.  Psychiatric/Behavioral:  Negative for depressed mood and sleep disturbance. The patient is not nervous/anxious.     PMFS History:  Patient Active Problem List   Diagnosis Date Noted   Recurrent major depressive disorder, in partial remission (HCC) 05/19/2019   Localization-related idiopathic epilepsy and epileptic syndromes with seizures of localized onset, not intractable, without status epilepticus (HCC) 07/20/2018   Seizure disorder (HCC) 03/24/2016   Migraine 03/24/2016    Past Medical History:  Diagnosis Date   Anxiety    Complication of anesthesia    Depression    Family history of adverse reaction to anesthesia    GERD (gastroesophageal reflux disease)    History of COVID-19    per pt mild symptoms that resolved 03/ 2022  and 09/ 2022   Ovarian cyst, left    Seizure disorder (HCC)    (03-03-2022   pt stated last seizure seizure approxl 2020/ 2021, none since , no medications) seen 3 different neurologist documented in epic, first seen by dr yan lov 03-24-2016;  dr georjean ronco 06-01-2019;   dr e. feraru ([facility] in care [facility] 10-05-2019;;  febrile seizures as infant then started 2001 as teen;   hx negative EEG's in epic    Family History  Problem Relation Age of Onset   Seizures Mother    Other Father        smoldering myeloma   Hypertension Father    Crohn's disease Brother    Hypertension Maternal Grandmother    Past Surgical History:  Procedure Laterality Date   APPENDECTOMY  2002   LAPAROSCOPIC OOPHORECTOMY Right 2007   per pt  thinks it was right ovary removed   ROBOTIC ASSISTED LAPAROSCOPIC OVARIAN CYSTECTOMY Left 03/06/2022   Procedure: XI ROBOTIC ASSISTED LAPAROSCOPIC OVARIAN CYSTECTOMY;  Surgeon: Rendell Calton LABOR, DO;  Location: [facility];  Service: Gynecology;  Laterality: Left;   Social History   Social History Narrative   Pt lives alone in 1 story home, on the second floor   Has associates degree   Works in daycare facility   Immunization History  Administered Date(s) Administered   PFIZER(Purple Top)SARS-COV-2 Vaccination 08/29/2020, 09/19/2020     Objective: Vital Signs: BP 109/77 (BP Location: Left Arm, Patient Position: Sitting, Cuff Size: Normal)   Pulse 92   Resp 16   Ht 5' 7 (1.702 m)   Wt 198 lb (89.8 kg)   LMP 06/08/2024   BMI 31.01 kg/m    Physical Exam Vitals and nursing note reviewed.  Constitutional:      Appearance: She is well-developed.  HENT:     Head: Normocephalic and atraumatic.  Eyes:     Conjunctiva/sclera: Conjunctivae normal.  Cardiovascular:     Rate and Rhythm: Normal rate and regular rhythm.     Heart sounds: Normal heart sounds.  Pulmonary:     Effort: Pulmonary effort is normal.     Breath sounds: Normal breath sounds.  Abdominal:     General: Bowel sounds are normal.     Palpations: Abdomen is soft.  Musculoskeletal:     Cervical back: Normal range of motion.  Lymphadenopathy:     Cervical: No cervical adenopathy.  Skin:    General: Skin is warm and dry.     Capillary Refill: Capillary refill takes less than 2 seconds.  Neurological:     Mental Status: She is alert and oriented to person, place, and time.  Psychiatric:        Behavior: Behavior normal.      Musculoskeletal Exam: C-spine has good range of motion with some discomfort with lateral rotation.  Trapezius muscle tension and tenderness bilaterally.  Thoracic and lumbar spine have good range of motion.  Shoulder joints, elbow joints, wrist joints, MCPs, PIPs, DIPs have good range of motion with no synovitis.  Complete fist formation bilaterally.  Hip joints have good range of motion with no groin pain.  Knee joints have good range of motion no warmth  or effusion.  Ankle joints have good range of motion no tenderness or joint swelling.   CDAI Exam: CDAI Score: -- Patient Global: --; Provider Global: -- Swollen: --; Tender: -- Joint Exam 07/05/2024   No joint exam has been documented for this visit   There is currently no information documented on the homunculus. Go to the Rheumatology activity and complete the homunculus joint exam.  Investigation: No additional findings.  Imaging: No results found.  Recent Labs: Lab Results  Component Value Date   WBC 10.5 09/08/2022   HGB 14.0 09/08/2022   PLT 445 (H) 09/08/2022   NA 138 09/08/2022   K 3.7 09/08/2022   CL 107 09/08/2022   CO2 22 09/08/2022   GLUCOSE 99 09/08/2022   BUN 10 09/08/2022  CREATININE 0.73 09/08/2022   BILITOT 0.1 (L) 09/08/2022   ALKPHOS 79 09/08/2022   AST 12 (L) 09/08/2022   ALT 14 09/08/2022   PROT 6.6 09/08/2022   ALBUMIN 3.9 09/08/2022   CALCIUM 9.3 09/08/2022   GFRAA >60 01/28/2019    Speciality Comments: No specialty comments available.  Procedures:  Trigger Point Inj  Date/Time: 07/05/2024 1:32 PM  Performed by: Cheryl Waddell HERO, PA-C Authorized by: Cheryl Waddell HERO, PA-C   Consent Given by:  Patient Site marked: the procedure site was marked   Timeout: prior to procedure the correct patient, procedure, and site was verified   Indications:  Pain Total # of Trigger Points:  2 Location: neck   Needle Size:  27 G Approach:  Dorsal Medications #1:  0.5 mL lidocaine  1 %; 10 mg triamcinolone  acetonide 40 MG/ML Medications #2:  0.5 mL lidocaine  1 %; 10 mg triamcinolone  acetonide 40 MG/ML Patient tolerance:  Patient tolerated the procedure well with no immediate complications  Allergies: Depakote er [divalproex sodium er], Valproic acid, and Dilantin [phenytoin sodium extended]   Assessment / Plan:     Visit Diagnoses: Fibromyalgia: She continues to experience intermittent myalgias and muscle tenderness due to fibromyalgia.  She presents  today with a recurrence of trapezius muscle tension and tenderness bilaterally.  She has been experiencing muscle spasms intermittently.  She has been using heating pad for pain relief and occasionally will take dual action acetaminophen and ibuprofen  for symptomatic relief.  She had bilateral trapezius trigger point injections performed on 03/01/2024 which reported significant relief but her symptoms have recurred.  She requested repeat trigger point injections today.  She tolerated the procedures well.  Procedure notes were completed above.  Aftercare was discussed.  Discussed the option of using pain patches such as Salonpas patches for symptomatic relief.  She was advised to notify us  if her symptoms persist or worsen.  Discussed the option of referring her to integrative therapies.  Discussed that she may benefit from massage therapy or dry needling in the future. She will follow up in 6 months or sooner if needed.   Trapezius muscle spasm - Trigger point injections on 03/01/24-right response.  She has had a recurrence of symptoms recently.  She is requesting bilateral trapezius trigger point injections today.  She tolerated the procedures well.  Procedure notes were completed above.  Aftercare was discussed. Discussed the use of pain patches for symptomatic relief.- Plan: Trigger Point Inj  Primary insomnia: She experiences interrupted sleep at night due to nocturnal pain.  Discussed the importance of good sleep hygiene.  Other fatigue: Chronic, stable.  Discussed the importance of regular exercise.  Positive ANA (antinuclear antibody) - 01/14/23: ANA 1:40 nuclear fine speckled, ESR WNL, CK WNL, TSH WNL, RF-, Anti-CCP negative, vitamin D  low-12.  Lab work was rechecked on 03/01/2024: ANA negative, complements within normal limits, ESR within normal limits, RNP negative, Smith negative, Ro antibody negative, La antibody negative, vitamin D  low but had improved-24.   No clinical features of systemic  lupus at this time  DDD (degenerative disc disease), cervical - X-rays showed mild spondylosis and facet joint arthropathy.  Most prominent narrowing was noted between C5-6 and C6-C7. She completed physical therapy. Patient presents today with a recurrence of trapezius muscle tension and tenderness bilaterally.  She had significant relief after having trigger point injections performed on 03/01/2024.  Her symptoms recently have recurred so she has requested repeat trigger point injections today.  She tolerated procedures well.  Procedure  notes were completed above.  Aftercare was discussed.  Arthropathy of lumbar facet joint: Intermittent discomfort.  No symptoms of radiculopathy.  Other medical conditions are listed as follows:  Vitamin D  deficiency  Anxiety and depression  Localization-related idiopathic epilepsy and epileptic syndromes with seizures of localized onset, not intractable, without status epilepticus (HCC)  Thrombocytosis  Hx of migraines  Benign teratoma of ovary, unspecified laterality  Family history of Crohn's disease-brother  Recurrent major depressive disorder, in partial remission (HCC)  History of anxiety  Orders: Orders Placed This Encounter  Procedures   Trigger Point Inj   No orders of the defined types were placed in this encounter.   Follow-Up Instructions: Return in about 6 months (around 01/05/2025) for Fibromyalgia, DDD.   Waddell CHRISTELLA Craze, PA-C  Note - This record has been created using Dragon software.  Chart creation errors have been sought, but may not always  have been located. Such creation errors do not reflect on  the standard of medical care.

## 2024-07-02 DIAGNOSIS — Z419 Encounter for procedure for purposes other than remedying health state, unspecified: Secondary | ICD-10-CM | POA: Diagnosis not present

## 2024-07-05 ENCOUNTER — Ambulatory Visit: Attending: Physician Assistant | Admitting: Physician Assistant

## 2024-07-05 ENCOUNTER — Encounter: Payer: Self-pay | Admitting: Physician Assistant

## 2024-07-05 VITALS — BP 109/77 | HR 92 | Resp 16 | Ht 67.0 in | Wt 198.0 lb

## 2024-07-05 DIAGNOSIS — E559 Vitamin D deficiency, unspecified: Secondary | ICD-10-CM | POA: Diagnosis not present

## 2024-07-05 DIAGNOSIS — M5030 Other cervical disc degeneration, unspecified cervical region: Secondary | ICD-10-CM | POA: Insufficient documentation

## 2024-07-05 DIAGNOSIS — M797 Fibromyalgia: Secondary | ICD-10-CM | POA: Diagnosis not present

## 2024-07-05 DIAGNOSIS — F5101 Primary insomnia: Secondary | ICD-10-CM | POA: Diagnosis not present

## 2024-07-05 DIAGNOSIS — R768 Other specified abnormal immunological findings in serum: Secondary | ICD-10-CM | POA: Insufficient documentation

## 2024-07-05 DIAGNOSIS — D279 Benign neoplasm of unspecified ovary: Secondary | ICD-10-CM | POA: Insufficient documentation

## 2024-07-05 DIAGNOSIS — F3341 Major depressive disorder, recurrent, in partial remission: Secondary | ICD-10-CM | POA: Diagnosis present

## 2024-07-05 DIAGNOSIS — R5383 Other fatigue: Secondary | ICD-10-CM | POA: Diagnosis present

## 2024-07-05 DIAGNOSIS — M47816 Spondylosis without myelopathy or radiculopathy, lumbar region: Secondary | ICD-10-CM | POA: Insufficient documentation

## 2024-07-05 DIAGNOSIS — Z8659 Personal history of other mental and behavioral disorders: Secondary | ICD-10-CM | POA: Insufficient documentation

## 2024-07-05 DIAGNOSIS — Z8669 Personal history of other diseases of the nervous system and sense organs: Secondary | ICD-10-CM | POA: Insufficient documentation

## 2024-07-05 DIAGNOSIS — M62838 Other muscle spasm: Secondary | ICD-10-CM | POA: Insufficient documentation

## 2024-07-05 DIAGNOSIS — F32A Depression, unspecified: Secondary | ICD-10-CM | POA: Insufficient documentation

## 2024-07-05 DIAGNOSIS — Z8379 Family history of other diseases of the digestive system: Secondary | ICD-10-CM | POA: Diagnosis not present

## 2024-07-05 DIAGNOSIS — D75839 Thrombocytosis, unspecified: Secondary | ICD-10-CM | POA: Diagnosis not present

## 2024-07-05 DIAGNOSIS — G40009 Localization-related (focal) (partial) idiopathic epilepsy and epileptic syndromes with seizures of localized onset, not intractable, without status epilepticus: Secondary | ICD-10-CM | POA: Diagnosis not present

## 2024-07-05 DIAGNOSIS — F419 Anxiety disorder, unspecified: Secondary | ICD-10-CM | POA: Insufficient documentation

## 2024-07-05 MED ORDER — TRIAMCINOLONE ACETONIDE 40 MG/ML IJ SUSP
10.0000 mg | INTRAMUSCULAR | Status: AC | PRN
  Administered 2024-07-05: 10 mg via INTRAMUSCULAR

## 2024-07-05 MED ORDER — LIDOCAINE HCL 1 % IJ SOLN
0.5000 mL | INTRAMUSCULAR | Status: AC | PRN
Start: 2024-07-05 — End: 2024-07-05
  Administered 2024-07-05: .5 mL

## 2024-07-05 MED ORDER — LIDOCAINE HCL 1 % IJ SOLN
0.5000 mL | INTRAMUSCULAR | Status: AC | PRN
  Administered 2024-07-05: .5 mL

## 2024-08-02 DIAGNOSIS — Z419 Encounter for procedure for purposes other than remedying health state, unspecified: Secondary | ICD-10-CM | POA: Diagnosis not present

## 2024-08-12 DIAGNOSIS — Z7251 High risk heterosexual behavior: Secondary | ICD-10-CM | POA: Diagnosis not present

## 2024-09-02 DIAGNOSIS — Z419 Encounter for procedure for purposes other than remedying health state, unspecified: Secondary | ICD-10-CM | POA: Diagnosis not present

## 2024-09-05 ENCOUNTER — Ambulatory Visit: Admitting: Rheumatology

## 2024-10-10 DIAGNOSIS — Z6831 Body mass index (BMI) 31.0-31.9, adult: Secondary | ICD-10-CM | POA: Diagnosis not present

## 2024-10-10 DIAGNOSIS — Z0000 Encounter for general adult medical examination without abnormal findings: Secondary | ICD-10-CM | POA: Diagnosis not present

## 2024-10-10 DIAGNOSIS — E559 Vitamin D deficiency, unspecified: Secondary | ICD-10-CM | POA: Diagnosis not present

## 2024-10-10 DIAGNOSIS — R220 Localized swelling, mass and lump, head: Secondary | ICD-10-CM | POA: Diagnosis not present

## 2024-10-10 DIAGNOSIS — Z113 Encounter for screening for infections with a predominantly sexual mode of transmission: Secondary | ICD-10-CM | POA: Diagnosis not present

## 2024-10-10 DIAGNOSIS — Z87898 Personal history of other specified conditions: Secondary | ICD-10-CM | POA: Diagnosis not present

## 2024-10-10 DIAGNOSIS — M797 Fibromyalgia: Secondary | ICD-10-CM | POA: Diagnosis not present

## 2024-10-10 DIAGNOSIS — E66811 Obesity, class 1: Secondary | ICD-10-CM | POA: Diagnosis not present

## 2024-10-10 DIAGNOSIS — E6609 Other obesity due to excess calories: Secondary | ICD-10-CM | POA: Diagnosis not present

## 2024-10-10 DIAGNOSIS — F411 Generalized anxiety disorder: Secondary | ICD-10-CM | POA: Diagnosis not present

## 2024-12-27 NOTE — Progress Notes (Signed)
 "  Office Visit Note  Patient: Virginia Bradley             Date of Birth: 03/19/1986           MRN: [id number]             PCP: Benjamine Aland, MD Referring: Benjamine Aland, MD Visit Date: 01/05/2025 Occupation: Teacher  Subjective:  Neck pain  History of Present Illness: Virginia Bradley is a 39 y.o. female with fibromyalgia syndrome and degenerative disc disease.  She states she has been having increased discomfort in the trapezius region.  She is requesting trigger point injections.  She had good response to previous trigger point injections.  She denies any joint pain or joint swelling.  She denies any muscle pain.  She has not had any recent fibromyalgia flares.  She has disc disease in her cervical spine which is not bothersome.  The lower back pain is also quiet.  She has been taking vitamin D  once a week.  She was recently started on Wegovy.    Activities of Daily Living:  Patient reports morning stiffness for 5-10 minutes.   Patient Reports nocturnal pain.  Difficulty dressing/grooming: Denies Difficulty climbing stairs: Denies Difficulty getting out of chair: Denies Difficulty using hands for taps, buttons, cutlery, and/or writing: Denies  Review of Systems  Constitutional:  Positive for fatigue.  HENT:  Negative for mouth sores and mouth dryness.   Eyes:  Negative for dryness.  Respiratory:  Negative for shortness of breath.   Cardiovascular:  Positive for palpitations. Negative for chest pain.  Gastrointestinal:  Negative for blood in stool, constipation and diarrhea.  Endocrine: Negative for increased urination.  Genitourinary:  Negative for involuntary urination.  Musculoskeletal:  Positive for joint pain, joint pain and morning stiffness. Negative for gait problem, joint swelling, myalgias, muscle weakness, muscle tenderness and myalgias.  Skin:  Negative for color change, rash, hair loss and sensitivity to sunlight.  Allergic/Immunologic: Negative for susceptible to  infections.  Neurological:  Positive for numbness and parasthesias. Negative for dizziness and headaches.  Hematological:  Negative for swollen glands.  Psychiatric/Behavioral:  Negative for depressed mood and sleep disturbance. The patient is not nervous/anxious.     PMFS History:  Patient Active Problem List   Diagnosis Date Noted   Recurrent major depressive disorder, in partial remission 05/19/2019   Localization-related idiopathic epilepsy and epileptic syndromes with seizures of localized onset, not intractable, without status epilepticus (HCC) 07/20/2018   Seizure disorder (HCC) 03/24/2016   Migraine 03/24/2016    Past Medical History:  Diagnosis Date   Anxiety    Complication of anesthesia    Depression    Family history of adverse reaction to anesthesia    GERD (gastroesophageal reflux disease)    History of COVID-19    per pt mild symptoms that resolved 03/ 2022  and 09/ 2022   Ovarian cyst, left    Seizure disorder (HCC)    (03-03-2022   pt stated last seizure seizure approxl 2020/ 2021, none since , no medications) seen 3 different neurologist documented in epic, first seen by dr onita ronco 03-24-2016;  dr georjean ronco 06-01-2019;   dr e. feraru ([facility] in [facility] 10-05-2019;;  febrile seizures as infant then started 2001 as teen;   hx negative EEG's in epic    Family History  Problem Relation Age of Onset   Seizures Mother    Other Father        smoldering myeloma  Hypertension Father    Crohn's disease Brother    Hypertension Maternal Grandmother    Past Surgical History:  Procedure Laterality Date   APPENDECTOMY  2002   LAPAROSCOPIC OOPHORECTOMY Right 2007   per pt  thinks it was right ovary removed   ROBOTIC ASSISTED LAPAROSCOPIC OVARIAN CYSTECTOMY Left 03/06/2022   Procedure: XI ROBOTIC ASSISTED LAPAROSCOPIC OVARIAN CYSTECTOMY;  Surgeon: Rendell Calton LABOR, DO;  Location: [facility];  Service: Gynecology;  Laterality: Left;    Social History[1] Social History   Social History Narrative   Pt lives alone in 1 story home, on the second floor   Has associates degree   Works in daycare facility     Immunization History  Administered Date(s) Administered   PFIZER(Purple Top)SARS-COV-2 Vaccination 08/29/2020, 09/19/2020     Objective: Vital Signs: BP 110/75   Pulse 97   Temp (!) 97.4 F (36.3 C)   Resp 16   Ht 5' 7 (1.702 m)   Wt 201 lb 6.4 oz (91.4 kg)   BMI 31.54 kg/m    Physical Exam Vitals and nursing note reviewed.  Constitutional:      Appearance: She is well-developed.  HENT:     Head: Normocephalic and atraumatic.  Eyes:     Conjunctiva/sclera: Conjunctivae normal.  Cardiovascular:     Rate and Rhythm: Normal rate and regular rhythm.     Heart sounds: Normal heart sounds.  Pulmonary:     Effort: Pulmonary effort is normal.     Breath sounds: Normal breath sounds.  Abdominal:     General: Bowel sounds are normal.     Palpations: Abdomen is soft.  Musculoskeletal:     Cervical back: Normal range of motion.  Lymphadenopathy:     Cervical: No cervical adenopathy.  Skin:    General: Skin is warm and dry.     Capillary Refill: Capillary refill takes less than 2 seconds.  Neurological:     Mental Status: She is alert and oriented to person, place, and time.  Psychiatric:        Behavior: Behavior normal.      Musculoskeletal Exam: Cervical, thoracic and lumbar spine were in good range of motion.  She had bilateral trapezius spasm.  There was no SI joint tenderness.  Shoulder joints, elbow joints, wrist joints, MCPs, PIPs and DIPs were in good range of motion with no synovitis.  Hip joints and knee joints were in good range of motion without any warmth swelling or effusion.  There was no tenderness over ankles or MTPs.   CDAI Exam: CDAI Score: -- Patient Global: --; Provider Global: -- Swollen: --; Tender: -- Joint Exam 01/05/2025   No joint exam has been documented for this  visit   There is currently no information documented on the homunculus. Go to the Rheumatology activity and complete the homunculus joint exam.  Investigation: No additional findings.  Imaging: No results found.  Recent Labs: Lab Results  Component Value Date   WBC 10.5 09/08/2022   HGB 14.0 09/08/2022   PLT 445 (H) 09/08/2022   NA 138 09/08/2022   K 3.7 09/08/2022   CL 107 09/08/2022   CO2 22 09/08/2022   GLUCOSE 99 09/08/2022   BUN 10 09/08/2022   CREATININE 0.73 09/08/2022   BILITOT 0.1 (L) 09/08/2022   ALKPHOS 79 09/08/2022   AST 12 (L) 09/08/2022   ALT 14 09/08/2022   PROT 6.6 09/08/2022   ALBUMIN 3.9 09/08/2022   CALCIUM 9.3 09/08/2022  GFRAA >60 01/28/2019    Speciality Comments: No specialty comments available.  Procedures:  Trigger Point Inj  Date/Time: 01/05/2025 11:22 AM  Performed by: Dolphus Reiter, MD Authorized by: Dolphus Reiter, MD   Consent Given by:  Patient Site marked: the procedure site was marked   Timeout: prior to procedure the correct patient, procedure, and site was verified   Indications:  Muscle spasm and pain Total # of Trigger Points:  2 Location: neck   Needle Size:  27 G Approach:  Dorsal Medications #1:  0.5 mL lidocaine  1 %; 20 mg triamcinolone  acetonide 40 MG/ML Medications #2:  0.5 mL lidocaine  1 %; 20 mg triamcinolone  acetonide 40 MG/ML Patient tolerance:  Patient tolerated the procedure well with no immediate complications Comments: Risk of infection, tendon injury, nerve injury, dermal atrophy and hypopigmentation were discussed.  Allergies: Depakote er [divalproex sodium er], Valproic acid, and Dilantin [phenytoin sodium extended]   Assessment / Plan:     Visit Diagnoses: Fibromyalgia-patient gives history of intermittent discomfort.  She has not had any recent fibromyalgia flares.  Trapezius muscle spasm-she had bilateral trapezius spasm.  She states she good response to previous injections in July.  She  requested repeat trigger point injections.  After informed consent was obtained bilateral breezes area were prepped in sterile fashion and injected with lidocaine  and Kenalog  as described above.  She tolerated the procedure well.  A handout on medical exercise was given.  Primary insomnia-sleep hygiene was discussed.  Other fatigue-related to insomnia and fibromyalgia.  She  Positive ANA (antinuclear antibody) - 03/01/2024: ANA negative, complements WNL, ESR WNL, RNP negative, Smith negative, Ro antibody negative, La antibody negative, vitamin D  low but had improved-24.  No clinical features of lupus.  DDD (degenerative disc disease), cervical -she has intermittent discomfort in her cervical spine.  She has been going to the gym on a regular basis.  A handout on neck stretching exercise was given.  X-rays showed mild spondylosis and facet joint arthropathy.  Most prominent narrowing was noted between C5-6 and C6-C7.  Arthropathy of lumbar facet joint-she denies any recent discomfort.  Vitamin D  deficiency-she has been taking vitamin D .  Other medical problems are listed as follows:  Localization-related idiopathic epilepsy and epileptic syndromes with seizures of localized onset, not intractable, without status epilepticus (HCC)  Anxiety and depression  Hx of migraines  Thrombocytosis  Benign teratoma of ovary, unspecified laterality  Family history of Crohn's disease-brother  Recurrent major depressive disorder, in partial remission  History of anxiety  Orders: Orders Placed This Encounter  Procedures   Trigger Point Inj   No orders of the defined types were placed in this encounter.    Follow-Up Instructions: Return in about 6 months (around 07/05/2025) for FMS, DDD.   Reiter Dolphus, MD  Note - This record has been created using Animal nutritionist.  Chart creation errors have been sought, but may not always  have been located. Such creation errors do not reflect on   the standard of medical care.     [1]  Social History Tobacco Use   Smoking status: Former    Types: Cigars    Passive exposure: Current   Smokeless tobacco: Never   Tobacco comments:    03-03-2022  Small cigar (black and mild)  one per daily/  5 per week ,  has smoked for 10 yrs  Vaping Use   Vaping status: Never Used  Substance Use Topics   Alcohol use: Yes    Comment: occasional  Drug use: Yes    Types: Marijuana    Comment: occ   "

## 2025-01-05 ENCOUNTER — Ambulatory Visit: Attending: Rheumatology | Admitting: Rheumatology

## 2025-01-05 ENCOUNTER — Encounter: Payer: Self-pay | Admitting: Rheumatology

## 2025-01-05 VITALS — BP 110/75 | HR 97 | Temp 97.4°F | Resp 16 | Ht 67.0 in | Wt 201.4 lb

## 2025-01-05 DIAGNOSIS — M5030 Other cervical disc degeneration, unspecified cervical region: Secondary | ICD-10-CM | POA: Diagnosis not present

## 2025-01-05 DIAGNOSIS — F5101 Primary insomnia: Secondary | ICD-10-CM | POA: Diagnosis not present

## 2025-01-05 DIAGNOSIS — M47816 Spondylosis without myelopathy or radiculopathy, lumbar region: Secondary | ICD-10-CM | POA: Insufficient documentation

## 2025-01-05 DIAGNOSIS — M797 Fibromyalgia: Secondary | ICD-10-CM | POA: Insufficient documentation

## 2025-01-05 DIAGNOSIS — Z8669 Personal history of other diseases of the nervous system and sense organs: Secondary | ICD-10-CM | POA: Insufficient documentation

## 2025-01-05 DIAGNOSIS — F419 Anxiety disorder, unspecified: Secondary | ICD-10-CM | POA: Insufficient documentation

## 2025-01-05 DIAGNOSIS — R7689 Other specified abnormal immunological findings in serum: Secondary | ICD-10-CM | POA: Insufficient documentation

## 2025-01-05 DIAGNOSIS — M62838 Other muscle spasm: Secondary | ICD-10-CM | POA: Diagnosis not present

## 2025-01-05 DIAGNOSIS — F32A Depression, unspecified: Secondary | ICD-10-CM | POA: Diagnosis present

## 2025-01-05 DIAGNOSIS — E559 Vitamin D deficiency, unspecified: Secondary | ICD-10-CM | POA: Insufficient documentation

## 2025-01-05 DIAGNOSIS — D75839 Thrombocytosis, unspecified: Secondary | ICD-10-CM | POA: Diagnosis not present

## 2025-01-05 DIAGNOSIS — D279 Benign neoplasm of unspecified ovary: Secondary | ICD-10-CM | POA: Insufficient documentation

## 2025-01-05 DIAGNOSIS — G40009 Localization-related (focal) (partial) idiopathic epilepsy and epileptic syndromes with seizures of localized onset, not intractable, without status epilepticus: Secondary | ICD-10-CM | POA: Diagnosis not present

## 2025-01-05 DIAGNOSIS — F3341 Major depressive disorder, recurrent, in partial remission: Secondary | ICD-10-CM | POA: Insufficient documentation

## 2025-01-05 DIAGNOSIS — Z8379 Family history of other diseases of the digestive system: Secondary | ICD-10-CM | POA: Diagnosis present

## 2025-01-05 DIAGNOSIS — Z8659 Personal history of other mental and behavioral disorders: Secondary | ICD-10-CM | POA: Diagnosis present

## 2025-01-05 DIAGNOSIS — R5383 Other fatigue: Secondary | ICD-10-CM | POA: Insufficient documentation

## 2025-01-05 MED ORDER — LIDOCAINE HCL 1 % IJ SOLN
0.5000 mL | INTRAMUSCULAR | Status: AC | PRN
  Administered 2025-01-05: .5 mL

## 2025-01-05 MED ORDER — TRIAMCINOLONE ACETONIDE 40 MG/ML IJ SUSP
20.0000 mg | INTRAMUSCULAR | Status: AC | PRN
  Administered 2025-01-05: 20 mg via INTRAMUSCULAR

## 2025-01-05 NOTE — Patient Instructions (Signed)
 Cervical Strain and Sprain Rehab Ask your health care provider which exercises are safe for you. Do exercises exactly as told by your health care provider and adjust them as directed. It is normal to feel mild stretching, pulling, tightness, or discomfort as you do these exercises. Stop right away if you feel sudden pain or your pain gets worse. Do not begin these exercises until told by your health care provider. Stretching and range-of-motion exercises Cervical side bending  Using good posture, sit on a stable chair or stand up. Without moving your shoulders, slowly tilt your left / right ear to your shoulder until you feel a stretch in the neck muscles on the opposite side. You should be looking straight ahead. Hold for __________ seconds. Repeat with the other side of your neck. Repeat __________ times. Complete this exercise __________ times a day. Cervical rotation  Using good posture, sit on a stable chair or stand up. Slowly turn your head to the side as if you are looking over your left / right shoulder. Keep your eyes level with the ground. Stop when you feel a stretch along the side and the back of your neck. Hold for __________ seconds. Repeat this by turning to your other side. Repeat __________ times. Complete this exercise __________ times a day. Thoracic extension and pectoral stretch  Roll a towel or a small blanket so it is about 4 inches (10 cm) in diameter. Lie down on your back on a firm surface. Put the towel in the middle of your back across your spine. It should not be under your shoulder blades. Put your hands behind your head and let your elbows fall out to your sides. Hold for __________ seconds. Repeat __________ times. Complete this exercise __________ times a day. Strengthening exercises Upper cervical flexion  Lie on your back with a thin pillow behind your head or a small, rolled-up towel under your neck. Gently tuck your chin toward your chest and nod  your head down to look toward your feet. Do not lift your head off the pillow. Hold for __________ seconds. Release the tension slowly. Relax your neck muscles completely before you repeat this exercise. Repeat __________ times. Complete this exercise __________ times a day. Cervical extension  Stand about 6 inches (15 cm) away from a wall, with your back facing the wall. Place a soft object, about 6-8 inches (15-20 cm) in diameter, between the back of your head and the wall. A soft object could be a small pillow, a ball, or a folded towel. Gently tilt your head back and press into the soft object. Keep your jaw and forehead relaxed. Hold for __________ seconds. Release the tension slowly. Relax your neck muscles completely before you repeat this exercise. Repeat __________ times. Complete this exercise __________ times a day. Posture and body mechanics Body mechanics refer to the movements and positions of your body while you do your daily activities. Posture is part of body mechanics. Good posture and healthy body mechanics can help to relieve stress in your body's tissues and joints. Good posture means that your spine is in its natural S-curve position (your spine is neutral), your shoulders are pulled back slightly, and your head is not tipped forward. The following are general guidelines for using improved posture and body mechanics in your everyday activities. Sitting  When sitting, keep your spine neutral and keep your feet flat on the floor. Use a footrest, if needed, and keep your thighs parallel to the floor. Avoid rounding  your shoulders. Avoid tilting your head forward. When working at a desk or a computer, keep your desk at a height where your hands are slightly lower than your elbows. Slide your chair under your desk so you are close enough to maintain good posture. When working at a computer, place your monitor at a height where you are looking straight ahead and you do not have to  tilt your head forward or downward to look at the screen. Standing  When standing, keep your spine neutral and keep your feet about hip-width apart. Keep a slight bend in your knees. Your ears, shoulders, and hips should line up. When you do a task in which you stand in one place for a long time, place one foot up on a stable object that is 2-4 inches (5-10 cm) high, such as a footstool. This helps keep your spine neutral. Resting When lying down and resting, avoid positions that are most painful for you. Try to support your neck in a neutral position. You can use a contour pillow or a small rolled-up towel. Your pillow should support your neck but not push on it. This information is not intended to replace advice given to you by your health care provider. Make sure you discuss any questions you have with your health care provider. Document Revised: 04/13/2023 Document Reviewed: 06/30/2022 [employer] Patient Education  2024 [employer].

## 2025-07-05 ENCOUNTER — Ambulatory Visit: Admitting: Rheumatology
